# Patient Record
Sex: Female | Born: 1975 | Race: White | Hispanic: No | Marital: Married | Smoking: Never smoker
Health system: Southern US, Community
[De-identification: ages and names within clinical notes are randomized; demographics above are authoritative.]

## PROBLEM LIST (undated history)

## (undated) DIAGNOSIS — D649 Anemia, unspecified: Secondary | ICD-10-CM

## (undated) DIAGNOSIS — I160 Hypertensive urgency: Principal | ICD-10-CM

## (undated) DIAGNOSIS — R0989 Other specified symptoms and signs involving the circulatory and respiratory systems: Secondary | ICD-10-CM

## (undated) DIAGNOSIS — I10 Essential (primary) hypertension: Secondary | ICD-10-CM

## (undated) DIAGNOSIS — E7800 Pure hypercholesterolemia, unspecified: Secondary | ICD-10-CM

## (undated) DIAGNOSIS — Z87442 Personal history of urinary calculi: Secondary | ICD-10-CM

## (undated) DIAGNOSIS — L600 Ingrowing nail: Secondary | ICD-10-CM

## (undated) DIAGNOSIS — R7303 Prediabetes: Secondary | ICD-10-CM

## (undated) DIAGNOSIS — B009 Herpesviral infection, unspecified: Secondary | ICD-10-CM

## (undated) DIAGNOSIS — K219 Gastro-esophageal reflux disease without esophagitis: Secondary | ICD-10-CM

## (undated) DIAGNOSIS — J989 Respiratory disorder, unspecified: Secondary | ICD-10-CM

## (undated) DIAGNOSIS — E039 Hypothyroidism, unspecified: Secondary | ICD-10-CM

## (undated) DIAGNOSIS — D509 Iron deficiency anemia, unspecified: Secondary | ICD-10-CM

## (undated) HISTORY — PX: TOENAIL AVULSION: SHX1074

## (undated) HISTORY — DX: Hypertensive urgency: I16.0

## (undated) HISTORY — DX: Hypothyroidism, unspecified: E03.9

---
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
## 2012-11-06 ENCOUNTER — Other Ambulatory Visit: Payer: Self-pay | Admitting: Occupational Medicine

## 2012-11-06 ENCOUNTER — Ambulatory Visit: Payer: Self-pay

## 2012-11-06 DIAGNOSIS — R7612 Nonspecific reaction to cell mediated immunity measurement of gamma interferon antigen response without active tuberculosis: Secondary | ICD-10-CM

## 2013-11-19 ENCOUNTER — Encounter (HOSPITAL_COMMUNITY): Payer: Self-pay | Admitting: Emergency Medicine

## 2013-11-19 ENCOUNTER — Emergency Department (HOSPITAL_COMMUNITY): Payer: Self-pay

## 2013-11-19 ENCOUNTER — Emergency Department (HOSPITAL_COMMUNITY)
Admission: EM | Admit: 2013-11-19 | Discharge: 2013-11-19 | Disposition: A | Discharge status: Home / Self Care | Payer: Self-pay | Attending: Emergency Medicine | Admitting: Emergency Medicine

## 2013-11-19 DIAGNOSIS — N200 Calculus of kidney: Secondary | ICD-10-CM | POA: Insufficient documentation

## 2013-11-19 DIAGNOSIS — N390 Urinary tract infection, site not specified: Secondary | ICD-10-CM | POA: Insufficient documentation

## 2013-11-19 DIAGNOSIS — Z79899 Other long term (current) drug therapy: Secondary | ICD-10-CM | POA: Insufficient documentation

## 2013-11-19 DIAGNOSIS — R110 Nausea: Secondary | ICD-10-CM | POA: Insufficient documentation

## 2013-11-19 DIAGNOSIS — Z3202 Encounter for pregnancy test, result negative: Secondary | ICD-10-CM | POA: Insufficient documentation

## 2013-11-19 LAB — COMPREHENSIVE METABOLIC PANEL
ALBUMIN: 3.8 g/dL (ref 3.5–5.2)
ALT: 17 U/L (ref 0–35)
AST: 21 U/L (ref 0–37)
Alkaline Phosphatase: 56 U/L (ref 39–117)
BUN: 17 mg/dL (ref 6–23)
CALCIUM: 9 mg/dL (ref 8.4–10.5)
CO2: 24 mEq/L (ref 19–32)
CREATININE: 0.94 mg/dL (ref 0.50–1.10)
Chloride: 98 mEq/L (ref 96–112)
GFR calc Af Amer: 87 mL/min — ABNORMAL LOW (ref >90)
GFR, EST NON AFRICAN AMERICAN: 75 mL/min — AB (ref >90)
Glucose, Bld: 213 mg/dL — ABNORMAL HIGH (ref 70–99)
Potassium: 3.1 mEq/L — ABNORMAL LOW (ref 3.7–5.3)
Sodium: 140 mEq/L (ref 137–147)
Total Bilirubin: 0.3 mg/dL (ref 0.3–1.2)
Total Protein: 6.8 g/dL (ref 6.0–8.3)

## 2013-11-19 LAB — CBC WITH DIFFERENTIAL/PLATELET
BASOS ABS: 0 10*3/uL (ref 0.0–0.1)
BASOS PCT: 0 % (ref 0–1)
EOS ABS: 0 10*3/uL (ref 0.0–0.7)
EOS PCT: 0 % (ref 0–5)
HEMATOCRIT: 42.3 % (ref 36.0–46.0)
Hemoglobin: 14.5 g/dL (ref 12.0–15.0)
Lymphocytes Relative: 14 % (ref 12–46)
Lymphs Abs: 1.7 10*3/uL (ref 0.7–4.0)
MCH: 31.7 pg (ref 26.0–34.0)
MCHC: 34.3 g/dL (ref 30.0–36.0)
MCV: 92.4 fL (ref 78.0–100.0)
MONO ABS: 0.5 10*3/uL (ref 0.1–1.0)
Monocytes Relative: 4 % (ref 3–12)
Neutro Abs: 10.3 10*3/uL — ABNORMAL HIGH (ref 1.7–7.7)
Neutrophils Relative %: 82 % — ABNORMAL HIGH (ref 43–77)
Platelets: 234 10*3/uL (ref 150–400)
RBC: 4.58 MIL/uL (ref 3.87–5.11)
RDW: 12.8 % (ref 11.5–15.5)
WBC: 12.5 10*3/uL — ABNORMAL HIGH (ref 4.0–10.5)

## 2013-11-19 LAB — URINE MICROSCOPIC-ADD ON

## 2013-11-19 LAB — URINALYSIS, ROUTINE W REFLEX MICROSCOPIC
Glucose, UA: NEGATIVE mg/dL
Ketones, ur: 15 mg/dL — AB
Nitrite: NEGATIVE
Protein, ur: 100 mg/dL — AB
SPECIFIC GRAVITY, URINE: 1.026 (ref 1.005–1.030)
UROBILINOGEN UA: 1 mg/dL (ref 0.0–1.0)
pH: 5 (ref 5.0–8.0)

## 2013-11-19 LAB — PREGNANCY, URINE: PREG TEST UR: NEGATIVE

## 2013-11-19 MED ORDER — CIPROFLOXACIN HCL 500 MG PO TABS
500.0000 mg | ORAL_TABLET | Freq: Once | ORAL | Status: AC
  Administered 2013-11-19: 500 mg via ORAL
  Filled 2013-11-19: qty 1

## 2013-11-19 MED ORDER — ONDANSETRON 8 MG PO TBDP: 4.0000 mg | ORAL_TABLET | Freq: Three times a day (TID) | ORAL | Status: DC | PRN

## 2013-11-19 MED ORDER — OXYCODONE-ACETAMINOPHEN 5-325 MG PO TABS: 1.0000 | ORAL_TABLET | Freq: Four times a day (QID) | ORAL | Status: DC | PRN

## 2013-11-19 MED ORDER — OXYCODONE-ACETAMINOPHEN 5-325 MG PO TABS
1.0000 | ORAL_TABLET | Freq: Once | ORAL | Status: AC
  Administered 2013-11-19: 1 via ORAL
  Filled 2013-11-19: qty 1

## 2013-11-19 MED ORDER — ONDANSETRON 4 MG PO TBDP
8.0000 mg | ORAL_TABLET | Freq: Once | ORAL | Status: AC
  Administered 2013-11-19: 8 mg via ORAL
  Filled 2013-11-19: qty 2

## 2013-11-19 MED ORDER — CIPROFLOXACIN HCL 500 MG PO TABS: 500.0000 mg | ORAL_TABLET | Freq: Two times a day (BID) | ORAL | Status: DC

## 2013-11-19 NOTE — ED Notes (Signed)
NP Baker Janus at the bedside.

## 2013-11-19 NOTE — Discharge Instructions (Signed)
You have multiple large stones in both kidneys.  There are none in the tubes that drain into kidneys, you do have a urinary tract, infection, You have been given a prescription for.  Please take this as directed.  We've also been given a prescription for something for pain to use as needed.  Is recommended that you followup with urology You have  been given a referral to Dr. Matilde Sprang , please call and make an appointment

## 2013-11-19 NOTE — ED Provider Notes (Signed)
CSN: [id number]     Arrival date & time 11/19/13  1713 History   First MD Initiated Contact with Patient 11/19/13 2113     Chief Complaint  Patient presents with  . Flank Pain     (Consider location/radiation/quality/duration/timing/severity/associated sxs/prior Treatment) HPI Comments: Patient with a history of kidney stones, states, that for the past, month.  She's had left flank discomfort, but thought it was just muscle pain from her job.  She's noticed hematuria for the past 3, days.  Tonight at work.  She had a sudden exacerbation of left flank pain, radiating to her abdomen, with nausea, no vomiting, or diarrhea  Patient is a 38 y.o. female presenting with flank pain. The history is provided by the patient.  Flank Pain This is a recurrent problem. The current episode started in the past 7 days. The problem has been gradually worsening. Associated symptoms include abdominal pain and nausea. Pertinent negatives include no fatigue, fever or vomiting. Associated symptoms comments: Left flank pain, and hematuria. Nothing aggravates the symptoms. The treatment provided no relief.    History reviewed. No pertinent past medical history. History reviewed. No pertinent past surgical history. No family history on file. History  Substance Use Topics  . Smoking status: Never Smoker   . Smokeless tobacco: Not on file  . Alcohol Use: No   OB History   Grav Para Term Preterm Abortions TAB SAB Ect Mult Living                 Review of Systems  Constitutional: Negative for fever and fatigue.  Gastrointestinal: Positive for nausea and abdominal pain. Negative for vomiting.  Genitourinary: Positive for hematuria and flank pain. Negative for dysuria, decreased urine volume and vaginal bleeding.  All other systems reviewed and are negative.     Allergies  Biaxin  Home Medications   Prior to Admission medications   Medication Sig Start Date End Date Taking? Authorizing Provider   ferrous sulfate 325 (65 FE) MG tablet Take 325 mg by mouth daily with breakfast.   Yes Historical Provider, MD  ibuprofen (ADVIL,MOTRIN) 200 MG tablet Take 400 mg by mouth every 6 (six) hours as needed for fever.   Yes Historical Provider, MD  Multiple Vitamins-Minerals (MULTIVITAMIN WITH MINERALS) tablet Take 1 tablet by mouth daily.   Yes Historical Provider, MD  PRESCRIPTION MEDICATION Take 1 tablet by mouth daily. Doesn't know the name of this medication   Yes Historical Provider, MD  valACYclovir (VALTREX) 500 MG tablet Take 500 mg by mouth daily.   Yes Historical Provider, MD   BP 161/76  Pulse 87  Temp(Src) 97.8 F (36.6 C) (Oral)  Resp 16  Ht 4' 9"  (1.448 m)  Wt 97 lb (43.999 kg)  BMI 20.98 kg/m2  SpO2 100%  LMP 11/18/2013 Physical Exam  Nursing note and vitals reviewed. Constitutional: She appears well-developed and well-nourished.  HENT:  Head: Normocephalic.  Eyes: Pupils are equal, round, and reactive to light.  Neck: Normal range of motion.  Cardiovascular: Normal rate and regular rhythm.   Pulmonary/Chest: Effort normal and breath sounds normal.  Abdominal: Soft. She exhibits no distension. There is no tenderness.  Neurological: She is alert.  Skin: No pallor.    ED Course  Procedures (including critical care time) Labs Review Labs Reviewed  CBC WITH DIFFERENTIAL - Abnormal; Notable for the following:    WBC 12.5 (*)    Neutrophils Relative % 82 (*)    Neutro Abs 10.3 (*)  All other components within normal limits  COMPREHENSIVE METABOLIC PANEL - Abnormal; Notable for the following:    Potassium 3.1 (*)    Glucose, Bld 213 (*)    GFR calc non Af Amer 75 (*)    GFR calc Af Amer 87 (*)    All other components within normal limits  URINALYSIS, ROUTINE W REFLEX MICROSCOPIC - Abnormal; Notable for the following:    Color, Urine AMBER (*)    APPearance CLOUDY (*)    Hgb urine dipstick LARGE (*)    Bilirubin Urine SMALL (*)    Ketones, ur 15 (*)     Protein, ur 100 (*)    Leukocytes, UA MODERATE (*)    All other components within normal limits  URINE MICROSCOPIC-ADD ON - Abnormal; Notable for the following:    Squamous Epithelial / LPF FEW (*)    Bacteria, UA MANY (*)    Crystals CA OXALATE CRYSTALS (*)    All other components within normal limits  PREGNANCY, URINE    Imaging Review No results found.   EKG Interpretation None      MDM  Since being discharged home with a diagnosis of UTI and renal stones.  She was informed that she has multiple stones in both renal holes recommend that you followup with urology.  She understands this.  She is being discharged home with a prescription for Cipro, Zofran, and Percocet.  She's been given return, precautions.  She's also been cautioned to use alternative form of birth control.  While she is taking the antibiotic until her meds.  Next menstrual cycle Final diagnoses:  None         Garald Balding, NP 11/19/13 2312

## 2013-11-19 NOTE — ED Notes (Signed)
Apologized to pt for wait time. Pt reports initial relief with pain medication but states pain is slowly returning. Now rates 6/10.

## 2013-11-19 NOTE — ED Notes (Signed)
Pt c/o lt flank pain since 1600 she was at work here.  Nauseated no vomiting or diarrhea.  Bloody urine for 2-3 days.  lmp  5-12

## 2013-11-20 NOTE — ED Provider Notes (Signed)
Medical screening examination/treatment/procedure(s) were performed by non-physician practitioner and as supervising physician I was immediately available for consultation/collaboration.    Dot Lanes, MD 11/20/13 [phone]

## 2013-11-21 LAB — URINE CULTURE
Colony Count: 6000
Special Requests: NORMAL

## 2013-12-08 ENCOUNTER — Other Ambulatory Visit: Payer: Self-pay | Admitting: Urology

## 2013-12-09 ENCOUNTER — Encounter (HOSPITAL_COMMUNITY): Payer: Self-pay | Admitting: Pharmacy Technician

## 2013-12-10 ENCOUNTER — Encounter (HOSPITAL_COMMUNITY): Payer: Self-pay

## 2013-12-10 ENCOUNTER — Other Ambulatory Visit: Payer: Self-pay

## 2013-12-10 ENCOUNTER — Encounter (HOSPITAL_COMMUNITY)
Admission: RE | Admit: 2013-12-10 | Discharge: 2013-12-10 | Disposition: A | Discharge status: Home / Self Care | Payer: Self-pay | Source: Ambulatory Visit | Attending: Urology | Admitting: Urology

## 2013-12-10 ENCOUNTER — Inpatient Hospital Stay (HOSPITAL_COMMUNITY): Admission: RE | Admit: 2013-12-10 | Payer: Self-pay | Source: Ambulatory Visit

## 2013-12-10 DIAGNOSIS — Z01812 Encounter for preprocedural laboratory examination: Secondary | ICD-10-CM | POA: Insufficient documentation

## 2013-12-10 DIAGNOSIS — Z01810 Encounter for preprocedural cardiovascular examination: Secondary | ICD-10-CM | POA: Insufficient documentation

## 2013-12-10 HISTORY — DX: Anemia, unspecified: D64.9

## 2013-12-10 HISTORY — DX: Personal history of urinary calculi: Z87.442

## 2013-12-10 HISTORY — DX: Ingrowing nail: L60.0

## 2013-12-10 HISTORY — DX: Gastro-esophageal reflux disease without esophagitis: K21.9

## 2013-12-10 HISTORY — DX: Pure hypercholesterolemia, unspecified: E78.00

## 2013-12-10 LAB — CBC
HEMATOCRIT: 43.3 % (ref 36.0–46.0)
Hemoglobin: 14.6 g/dL (ref 12.0–15.0)
MCH: 30.9 pg (ref 26.0–34.0)
MCHC: 33.7 g/dL (ref 30.0–36.0)
MCV: 91.7 fL (ref 78.0–100.0)
PLATELETS: 268 10*3/uL (ref 150–400)
RBC: 4.72 MIL/uL (ref 3.87–5.11)
RDW: 12.9 % (ref 11.5–15.5)
WBC: 7.6 10*3/uL (ref 4.0–10.5)

## 2013-12-10 LAB — BASIC METABOLIC PANEL
BUN: 14 mg/dL (ref 6–23)
CALCIUM: 9.1 mg/dL (ref 8.4–10.5)
CHLORIDE: 101 meq/L (ref 96–112)
CO2: 27 mEq/L (ref 19–32)
Creatinine, Ser: 0.78 mg/dL (ref 0.50–1.10)
GFR calc non Af Amer: >90 mL/min (ref >90)
Glucose, Bld: 89 mg/dL (ref 70–99)
Potassium: 4 mEq/L (ref 3.7–5.3)
Sodium: 139 mEq/L (ref 137–147)

## 2013-12-10 LAB — HCG, SERUM, QUALITATIVE: PREG SERUM: NEGATIVE

## 2013-12-10 NOTE — Patient Instructions (Signed)
Your procedure is scheduled on:  12/11/13  THURSDAY  Report to [facility] at   1030    AM.   Call this number if you have problems the morning of surgery: [phone]        Do not eat food  Or drink :After Midnight. tonight   Take these medicines the morning of surgery with A SIP OF WATER:No regular medications--- May take Ondasteron/Oxycodone if needed   .  Contacts, dentures or partial plates, or metal hairpins  can not be worn to surgery. Your family will be responsible for glasses, dentures, hearing aides while you are in surgery  Leave suitcase in the car. After surgery it may be brought to your room.  For patients admitted to the hospital, checkout time is 11:00 AM day of  discharge.         [facility] IS NOT RESPONSIBLE FOR ANY VALUABLES  Patients discharged the day of surgery will not be allowed to drive home. IF going home the day of surgery, you must have a driver and someone to stay with you for the first 24 hours  Name and phone number of your driver:  Louanna Raw                                                                                                                                       [facility] - Preparing for Surgery Before surgery, you can play an important role.  Because skin is not sterile, your skin needs to be as free of germs as possible.  You can reduce the number of germs on your skin by washing with CHG (chlorahexidine gluconate) soap before surgery.  CHG is an antiseptic cleaner which kills germs and bonds with the skin to continue killing germs even after washing. Please DO NOT use if you have an allergy to CHG or antibacterial soaps.  If your skin becomes reddened/irritated stop using the CHG and inform your nurse when you arrive at Short Stay. Do not shave (including legs and underarms) for at least 48 hours prior to the first CHG shower.  You may shave your  face/neck. Please follow these instructions carefully:  1.  Shower with CHG Soap the night before surgery and the  morning of Surgery.  2.  If you choose to wash your hair, wash your hair first as usual with your  normal  shampoo.  3.  After you shampoo, rinse your hair and body thoroughly to remove the  shampoo.                           4.  Use CHG as you would any other liquid soap.  You can apply chg directly  to the skin and wash  Gently with a scrungie or clean washcloth.  5.  Apply the CHG Soap to your body ONLY FROM THE NECK DOWN.   Do not use on face/ open                           Wound or open sores. Avoid contact with eyes, ears mouth and genitals (private parts).                       Wash face,  Genitals (private parts) with your normal soap.             6.  Wash thoroughly, paying special attention to the area where your surgery  will be performed.  7.  Thoroughly rinse your body with warm water from the neck down.  8.  DO NOT shower/wash with your normal soap after using and rinsing off  the CHG Soap.                9.  Pat yourself dry with a clean towel.            10.  Wear clean pajamas.            11.  Place clean sheets on your bed the night of your first shower and do not  sleep with pets. Day of Surgery : Do not apply any lotions/deodorants the morning of surgery.  Please wear clean clothes to the hospital/surgery center.  FAILURE TO FOLLOW THESE INSTRUCTIONS MAY RESULT IN THE CANCELLATION OF YOUR SURGERY PATIENT SIGNATURE_________________________________  NURSE SIGNATURE__________________________________  ________________________________________________________________________

## 2013-12-11 ENCOUNTER — Ambulatory Visit (HOSPITAL_COMMUNITY): Admission: RE | Admit: 2013-12-11 | Payer: Self-pay | Source: Ambulatory Visit | Admitting: Urology

## 2013-12-11 ENCOUNTER — Encounter (HOSPITAL_COMMUNITY): Admission: RE | Payer: Self-pay | Source: Ambulatory Visit

## 2013-12-11 SURGERY — LITHOTRIPSY, ESWL
Anesthesia: LOCAL

## 2013-12-11 SURGERY — CYSTOURETEROSCOPY, WITH RETROGRADE PYELOGRAM AND STENT INSERTION
Anesthesia: General | Laterality: Left

## 2013-12-20 DIAGNOSIS — N200 Calculus of kidney: Secondary | ICD-10-CM | POA: Insufficient documentation

## 2014-05-23 ENCOUNTER — Emergency Department
Admission: EM | Admit: 2014-05-23 | Discharge: 2014-05-23 | Discharge status: Absent without leave | Payer: Self-pay | Source: Home / Self Care

## 2014-11-07 ENCOUNTER — Emergency Department (HOSPITAL_COMMUNITY)
Admission: EM | Admit: 2014-11-07 | Discharge: 2014-11-07 | Disposition: A | Discharge status: Home / Self Care | Payer: 59 | Source: Home / Self Care | Attending: Family Medicine | Admitting: Family Medicine

## 2014-11-07 ENCOUNTER — Encounter (HOSPITAL_COMMUNITY): Payer: Self-pay | Admitting: Emergency Medicine

## 2014-11-07 DIAGNOSIS — H109 Unspecified conjunctivitis: Secondary | ICD-10-CM

## 2014-11-07 DIAGNOSIS — I10 Essential (primary) hypertension: Secondary | ICD-10-CM

## 2014-11-07 MED ORDER — TOBRAMYCIN 0.3 % OP SOLN
OPHTHALMIC | Status: AC
  Filled 2014-11-07: qty 5

## 2014-11-07 MED ORDER — TOBRAMYCIN 0.3 % OP SOLN: 1.0000 [drp] | OPHTHALMIC | Status: DC

## 2014-11-07 NOTE — Discharge Instructions (Signed)
Your blood pressure is 220/100 today, you need to follow up with your primary care provider to be evaluated for your hypertension and consider starting blood pressure medication.   Conjunctivitis Conjunctivitis is commonly called "pink eye." Conjunctivitis can be caused by bacterial or viral infection, allergies, or injuries. There is usually redness of the lining of the eye, itching, discomfort, and sometimes discharge. There may be deposits of matter along the eyelids. A viral infection usually causes a watery discharge, while a bacterial infection causes a yellowish, thick discharge. Pink eye is very contagious and spreads by direct contact. You may be given antibiotic eyedrops as part of your treatment. Before using your eye medicine, remove all drainage from the eye by washing gently with warm water and cotton balls. Continue to use the medication until you have awakened 2 mornings in a row without discharge from the eye. Do not rub your eye. This increases the irritation and helps spread infection. Use separate towels from other household members. Wash your hands with soap and water before and after touching your eyes. Use cold compresses to reduce pain and sunglasses to relieve irritation from light. Do not wear contact lenses or wear eye makeup until the infection is gone. SEEK MEDICAL CARE IF:   Your symptoms are not better after 3 days of treatment.  You have increased pain or trouble seeing.  The outer eyelids become very red or swollen. Document Released: 08/03/2004 Document Revised: 09/18/2011 Document Reviewed: 06/26/2005 [facility] Patient Information 2015 [place], [place]. This information is not intended to replace advice given to you by your health care provider. Make sure you discuss any questions you have with your health care provider.

## 2014-11-07 NOTE — ED Provider Notes (Signed)
CSN: [id number]     Arrival date & time 11/07/14  1646 History   First MD Initiated Contact with Patient 11/07/14 1748     Chief Complaint  Patient presents with  . Eye Pain   (Consider location/radiation/quality/duration/timing/severity/associated sxs/prior Treatment) HPI      39 year old female presents for evaluation of redness and irritation of her left eye. This initially started on Friday. She had redness of her eye and her eyelid, swelling, and the eye was crusted shut when she woke up yesterday morning. She had Polytrim eye drops left over from a previous episode of conjunctivitis, she started using these and it has started getting better. However she ran out of the drops so she would like some more. She gets mildly blurred vision and she feels like there is a film of drainage over her eye but this resolves with blinking. She denies any pain in the eye. She denies any other symptoms such as chest pain, shortness of breath, NVD. She denies any trauma to the eye.  Past Medical History  Diagnosis Date  . History of kidney stones   . GERD (gastroesophageal reflux disease)   . Anemia   . Ingrown right big toenail 1998  . White coat syndrome without diagnosis of hypertension   . Hypercholesterolemia    History reviewed. No pertinent past surgical history. No family history on file. History  Substance Use Topics  . Smoking status: Never Smoker   . Smokeless tobacco: Never Used  . Alcohol Use: No   OB History    No data available     Review of Systems  Eyes: Positive for discharge, redness and itching. Negative for photophobia, pain and visual disturbance.  All other systems reviewed and are negative.   Allergies  Biaxin  Home Medications   Prior to Admission medications   Medication Sig Start Date End Date Taking? Authorizing Provider  acetaminophen (TYLENOL) 500 MG tablet Take 500-1,000 mg by mouth every 6 (six) hours as needed for moderate pain.    Historical Provider,  MD  cetirizine (ZYRTEC) 10 MG tablet Take 10 mg by mouth at bedtime.     Historical Provider, MD  ferrous sulfate 325 (65 FE) MG tablet Take 325 mg by mouth daily with breakfast.    Historical Provider, MD  fluticasone (FLONASE) 50 MCG/ACT nasal spray Place 1 spray into both nostrils daily as needed for allergies or rhinitis.    Historical Provider, MD  ibuprofen (ADVIL,MOTRIN) 200 MG tablet Take 600 mg by mouth every 6 (six) hours as needed for moderate pain.     Historical Provider, MD  Multiple Vitamins-Minerals (MULTIVITAMIN WITH MINERALS) tablet Take 1 tablet by mouth daily.    Historical Provider, MD  ondansetron (ZOFRAN-ODT) 8 MG disintegrating tablet Take 0.5 tablets (4 mg total) by mouth every 8 (eight) hours as needed for nausea or vomiting. 11/19/13   Junius Creamer, NP  oxyCODONE-acetaminophen (PERCOCET/ROXICET) 5-325 MG per tablet Take 0.5-1 tablets by mouth every 6 (six) hours as needed for severe pain.    Historical Provider, MD  tetrahydrozoline 0.05 % ophthalmic solution Place 1 drop into both eyes 2 (two) times daily as needed (red eyes).    Historical Provider, MD  valACYclovir (VALTREX) 500 MG tablet Take 500 mg by mouth daily.    Historical Provider, MD   BP 221/102 mmHg  Pulse 64  Temp(Src) 98.2 F (36.8 C) (Oral)  Resp 15  SpO2 98%  LMP 10/17/2014 Physical Exam  Constitutional: She is oriented to person,  place, and time. Vital signs are normal. She appears well-developed and well-nourished. No distress.  HENT:  Head: Normocephalic and atraumatic.  Eyes: EOM are normal. Pupils are equal, round, and reactive to light. Lids are everted and swept, no foreign bodies found. Left conjunctiva is injected.  Pulmonary/Chest: Effort normal. No respiratory distress.  Neurological: She is alert and oriented to person, place, and time. She has normal strength. Coordination normal.  Skin: Skin is warm and dry. No rash noted. She is not diaphoretic.  Psychiatric: She has a normal mood  and affect. Judgment normal.  Nursing note and vitals reviewed.   ED Course  Procedures (including critical care time) Labs Review Labs Reviewed - No data to display  Imaging Review No results found.   MDM   1. Conjunctivitis, left eye   2. Essential hypertension    Her eye has been improving with antibiotic drops, we will continue antibiotic drops. Her blood pressure is extremely elevated today. She declines any evaluation or intervention for this problem, she will go to see her primary care doctor in the coming week. If she develops chest pain, shortness of breath, numbness, weakness, or decreased urination/dark urine, she will go to the emergency department.  Meds ordered this encounter  Medications  . tobramycin (TOBREX) 0.3 % ophthalmic solution 1 drop    Sig:        Liam Graham, PA-C 11/07/14 1823

## 2014-11-07 NOTE — ED Notes (Addendum)
Eye problem.  Onset Friday morning of symptoms.  No known injury.  Patient does not where contacts.

## 2015-03-22 ENCOUNTER — Emergency Department
Admission: EM | Admit: 2015-03-22 | Discharge: 2015-03-22 | Disposition: A | Discharge status: Home / Self Care | Payer: 59 | Source: Home / Self Care | Attending: Family Medicine | Admitting: Family Medicine

## 2015-03-22 ENCOUNTER — Encounter: Payer: Self-pay | Admitting: *Deleted

## 2015-03-22 DIAGNOSIS — R030 Elevated blood-pressure reading, without diagnosis of hypertension: Secondary | ICD-10-CM

## 2015-03-22 DIAGNOSIS — R109 Unspecified abdominal pain: Secondary | ICD-10-CM

## 2015-03-22 DIAGNOSIS — N3090 Cystitis, unspecified without hematuria: Secondary | ICD-10-CM | POA: Diagnosis not present

## 2015-03-22 HISTORY — DX: Herpesviral infection, unspecified: B00.9

## 2015-03-22 LAB — POCT URINALYSIS DIP (MANUAL ENTRY)
Bilirubin, UA: NEGATIVE
GLUCOSE UA: NEGATIVE
Ketones, POC UA: NEGATIVE
NITRITE UA: NEGATIVE
SPEC GRAV UA: 1.01 (ref 1.005–1.03)
Urobilinogen, UA: 0.2 (ref 0–1)
pH, UA: 6 (ref 5–8)

## 2015-03-22 MED ORDER — PHENAZOPYRIDINE HCL 200 MG PO TABS: 200.0000 mg | ORAL_TABLET | Freq: Three times a day (TID) | ORAL | Status: DC

## 2015-03-22 MED ORDER — NITROFURANTOIN MONOHYD MACRO 100 MG PO CAPS: 100.0000 mg | ORAL_CAPSULE | Freq: Two times a day (BID) | ORAL | Status: DC

## 2015-03-22 NOTE — ED Notes (Signed)
Pt c/o 3 days of flank pain, polyuria and dysuria. Afebrile/

## 2015-03-22 NOTE — ED Provider Notes (Signed)
CSN: [id number]     Arrival date & time 03/22/15  4665 History   First MD Initiated Contact with Patient 03/22/15 1031     Chief Complaint  Patient presents with  . Flank Pain  . Polyuria      HPI Comments: Patient complains of 3 day history of urinary frequency, mild low back ache, mild dysuria, hematuria, and lower abdominal pressure.  She has a past history of nephrolithiasis. She denies history of hypertension.  Patient is a 39 y.o. female presenting with dysuria. The history is provided by the patient.  Dysuria Pain quality:  Burning Pain severity:  Mild Onset quality:  Gradual Duration:  3 days Timing:  Constant Progression:  Worsening Chronicity:  New Recent urinary tract infections: no   Relieved by:  None tried Worsened by:  Nothing tried Ineffective treatments:  None tried Urinary symptoms: frequent urination, hematuria and hesitancy   Urinary symptoms: no discolored urine, no foul-smelling urine and no bladder incontinence   Associated symptoms: flank pain   Associated symptoms: no abdominal pain, no fever, no genital lesions, no nausea, no vaginal discharge and no vomiting   Risk factors: hx of urolithiasis     Past Medical History  Diagnosis Date  . History of kidney stones   . GERD (gastroesophageal reflux disease)   . Anemia   . Ingrown right big toenail 1998  . White coat syndrome without diagnosis of hypertension   . Hypercholesterolemia   . HSV (herpes simplex virus) infection    Past Surgical History  Procedure Laterality Date  . Toenail avulsion     Family History  Problem Relation Age of Onset  . Anxiety disorder Mother   . Hypertension Mother   . Dementia Mother    Social History  Substance Use Topics  . Smoking status: Never Smoker   . Smokeless tobacco: Never Used  . Alcohol Use: No   OB History    No data available     Review of Systems  Constitutional: Negative for fever.  Gastrointestinal: Negative for nausea, vomiting and  abdominal pain.  Genitourinary: Positive for dysuria and flank pain. Negative for vaginal discharge.  All other systems reviewed and are negative.   Allergies  Biaxin  Home Medications   Prior to Admission medications   Medication Sig Start Date End Date Taking? Authorizing Provider  cetirizine (ZYRTEC) 10 MG tablet Take 10 mg by mouth at bedtime.    Yes Historical Provider, MD  fluticasone (FLONASE) 50 MCG/ACT nasal spray Place 1 spray into both nostrils daily as needed for allergies or rhinitis.   Yes Historical Provider, MD  levonorgestrel-ethinyl estradiol (AVIANE,ALESSE,LESSINA) 0.1-20 MG-MCG tablet Take 1 tablet by mouth daily.   Yes Historical Provider, MD  Multiple Vitamins-Minerals (MULTIVITAMIN WITH MINERALS) tablet Take 1 tablet by mouth daily.   Yes Historical Provider, MD  valACYclovir (VALTREX) 500 MG tablet Take 500 mg by mouth daily.   Yes Historical Provider, MD  ferrous sulfate 325 (65 FE) MG tablet Take 325 mg by mouth daily with breakfast.    Historical Provider, MD  nitrofurantoin, macrocrystal-monohydrate, (MACROBID) 100 MG capsule Take 1 capsule (100 mg total) by mouth 2 (two) times daily. Take with food. 03/22/15   Kandra Nicolas, MD  oxyCODONE-acetaminophen (PERCOCET/ROXICET) 5-325 MG per tablet Take 0.5-1 tablets by mouth every 6 (six) hours as needed for severe pain.    Historical Provider, MD  phenazopyridine (PYRIDIUM) 200 MG tablet Take 1 tablet (200 mg total) by mouth 3 (three) times daily.  Take after meals. 03/22/15   Kandra Nicolas, MD  tetrahydrozoline 0.05 % ophthalmic solution Place 1 drop into both eyes 2 (two) times daily as needed (red eyes).    Historical Provider, MD   Meds Ordered and Administered this Visit  Medications - No data to display  BP 200/104 mmHg  Pulse 81  Temp(Src) 98.6 F (37 C) (Oral)  Resp 16  Ht 4' 9"  (1.448 m)  Wt 108 lb (48.988 kg)  BMI 23.36 kg/m2  SpO2 100%  LMP 03/08/2015 No data found.   Physical Exam Nursing  notes and Vital Signs reviewed. Appearance:  Patient appears stated age, and in no acute distress Eyes:  Pupils are equal, round, and reactive to light and accomodation.  Extraocular movement is intact.  Conjunctivae are not inflamed  Nose:  Normal Pharynx:  Normal Neck:  Supple.   No adenopathy or thyromegaly Lungs:  Clear to auscultation.  Breath sounds are equal.  Moving air well. Heart:  Regular rate and rhythm without murmurs, rubs, or gallops.  Abdomen:  Nontender without masses or hepatosplenomegaly.  Bowel sounds are present.  No CVA or flank tenderness.  Extremities:  No edema.  No calf tenderness Skin:  No rash present.   ED Course  Procedures  None    Labs Reviewed  POCT URINALYSIS DIP (MANUAL ENTRY) - Abnormal; Notable for the following:    Blood, UA moderate (*)    Protein Ur, POC =30 (*)    Leukocytes, UA small (1+) (*)    All other components within normal limits  URINE CULTURE       MDM   1. Flank pain   2. Cystitis   3. Blood pressure elevated without history of HTN      Urine culture pending. Begin Macrobid 120m BID for one week. Increase fluid intake. If symptoms become significantly worse during the night or over the weekend, proceed to the local emergency room.  Followup with Family Doctor if not improved in one week.      Recommend obtaining blood pressure monitor and check at home regularly.  followup with family doctor if BP remains elevated.    SKandra Nicolas MD 03/30/15 [phone]

## 2015-03-22 NOTE — Discharge Instructions (Signed)
Continue increased fluid intake  Recommend obtaining blood pressure monitor and check at home regularly.  followup with family doctor if BP remains elevated.   Urinary Tract Infection Urinary tract infections (UTIs) can develop anywhere along your urinary tract. Your urinary tract is your body's drainage system for removing wastes and extra water. Your urinary tract includes two kidneys, two ureters, a bladder, and a urethra. Your kidneys are a pair of bean-shaped organs. Each kidney is about the size of your fist. They are located below your ribs, one on each side of your spine. CAUSES Infections are caused by microbes, which are microscopic organisms, including fungi, viruses, and bacteria. These organisms are so small that they can only be seen through a microscope. Bacteria are the microbes that most commonly cause UTIs. SYMPTOMS  Symptoms of UTIs may vary by age and gender of the patient and by the location of the infection. Symptoms in young women typically include a frequent and intense urge to urinate and a painful, burning feeling in the bladder or urethra during urination. Older women and men are more likely to be tired, shaky, and weak and have muscle aches and abdominal pain. A fever may mean the infection is in your kidneys. Other symptoms of a kidney infection include pain in your back or sides below the ribs, nausea, and vomiting. DIAGNOSIS To diagnose a UTI, your caregiver will ask you about your symptoms. Your caregiver also will ask to provide a urine sample. The urine sample will be tested for bacteria and white blood cells. White blood cells are made by your body to help fight infection. TREATMENT  Typically, UTIs can be treated with medication. Because most UTIs are caused by a bacterial infection, they usually can be treated with the use of antibiotics. The choice of antibiotic and length of treatment depend on your symptoms and the type of bacteria causing your infection. HOME  CARE INSTRUCTIONS  If you were prescribed antibiotics, take them exactly as your caregiver instructs you. Finish the medication even if you feel better after you have only taken some of the medication.  Drink enough water and fluids to keep your urine clear or pale yellow.  Avoid caffeine, tea, and carbonated beverages. They tend to irritate your bladder.  Empty your bladder often. Avoid holding urine for long periods of time.  Empty your bladder before and after sexual intercourse.  After a bowel movement, women should cleanse from front to back. Use each tissue only once. SEEK MEDICAL CARE IF:   You have back pain.  You develop a fever.  Your symptoms do not begin to resolve within 3 days. SEEK IMMEDIATE MEDICAL CARE IF:   You have severe back pain or lower abdominal pain.  You develop chills.  You have nausea or vomiting.  You have continued burning or discomfort with urination. MAKE SURE YOU:   Understand these instructions.  Will watch your condition.  Will get help right away if you are not doing well or get worse. Document Released: 04/05/2005 Document Revised: 12/26/2011 Document Reviewed: 08/04/2011 [facility] Patient Information 2015 [place], [place]. This information is not intended to replace advice given to you by your health care provider. Make sure you discuss any questions you have with your health care provider.    Hypertension Hypertension, commonly called high blood pressure, is when the force of blood pumping through your arteries is too strong. Your arteries are the blood vessels that carry blood from your heart throughout your body. A blood pressure  reading consists of a higher number over a lower number, such as 110/72. The higher number (systolic) is the pressure inside your arteries when your heart pumps. The lower number (diastolic) is the pressure inside your arteries when your heart relaxes. Ideally you want your blood pressure below  120/80. Hypertension forces your heart to work harder to pump blood. Your arteries may become narrow or stiff. Having hypertension puts you at risk for heart disease, stroke, and other problems.  RISK FACTORS Some risk factors for high blood pressure are controllable. Others are not.  Risk factors you cannot control include:   Race. You may be at higher risk if you are African American.  Age. Risk increases with age.  Gender. Men are at higher risk than women before age 33 years. After age 4, women are at higher risk than men. Risk factors you can control include:  Not getting enough exercise or physical activity.  Being overweight.  Getting too much fat, sugar, calories, or salt in your diet.  Drinking too much alcohol. SIGNS AND SYMPTOMS Hypertension does not usually cause signs or symptoms. Extremely high blood pressure (hypertensive crisis) may cause headache, anxiety, shortness of breath, and nosebleed. DIAGNOSIS  To check if you have hypertension, your health care provider will measure your blood pressure while you are seated, with your arm held at the level of your heart. It should be measured at least twice using the same arm. Certain conditions can cause a difference in blood pressure between your right and left arms. A blood pressure reading that is higher than normal on one occasion does not mean that you need treatment. If one blood pressure reading is high, ask your health care provider about having it checked again. TREATMENT  Treating high blood pressure includes making lifestyle changes and possibly taking medicine. Living a healthy lifestyle can help lower high blood pressure. You may need to change some of your habits. Lifestyle changes may include:  Following the DASH diet. This diet is high in fruits, vegetables, and whole grains. It is low in salt, red meat, and added sugars.  Getting at least 2 hours of brisk physical activity every week.  Losing weight if  necessary.  Not smoking.  Limiting alcoholic beverages.  Learning ways to reduce stress. If lifestyle changes are not enough to get your blood pressure under control, your health care provider may prescribe medicine. You may need to take more than one. Work closely with your health care provider to understand the risks and benefits. HOME CARE INSTRUCTIONS  Have your blood pressure rechecked as directed by your health care provider.   Take medicines only as directed by your health care provider. Follow the directions carefully. Blood pressure medicines must be taken as prescribed. The medicine does not work as well when you skip doses. Skipping doses also puts you at risk for problems.   Do not smoke.   Monitor your blood pressure at home as directed by your health care provider. SEEK MEDICAL CARE IF:   You think you are having a reaction to medicines taken.  You have recurrent headaches or feel dizzy.  You have swelling in your ankles.  You have trouble with your vision. SEEK IMMEDIATE MEDICAL CARE IF:  You develop a severe headache or confusion.  You have unusual weakness, numbness, or feel faint.  You have severe chest or abdominal pain.  You vomit repeatedly.  You have trouble breathing. MAKE SURE YOU:   Understand these instructions.  Will watch  your condition.  Will get help right away if you are not doing well or get worse. Document Released: 06/26/2005 Document Revised: 11/10/2013 Document Reviewed: 04/18/2013 [facility] Patient Information 2015 [place], [place]. This information is not intended to replace advice given to you by your health care provider. Make sure you discuss any questions you have with your health care provider.

## 2015-03-23 LAB — URINE CULTURE
Colony Count: NO GROWTH
Organism ID, Bacteria: NO GROWTH

## 2015-03-24 ENCOUNTER — Telehealth: Payer: Self-pay | Admitting: Emergency Medicine

## 2015-03-26 ENCOUNTER — Encounter: Payer: Self-pay | Admitting: Osteopathic Medicine

## 2015-03-26 ENCOUNTER — Ambulatory Visit (INDEPENDENT_AMBULATORY_CARE_PROVIDER_SITE_OTHER): Payer: 59 | Admitting: Osteopathic Medicine

## 2015-03-26 VITALS — BP 202/108 | HR 72 | Ht <= 58 in | Wt 109.0 lb

## 2015-03-26 DIAGNOSIS — R7989 Other specified abnormal findings of blood chemistry: Secondary | ICD-10-CM

## 2015-03-26 DIAGNOSIS — R079 Chest pain, unspecified: Secondary | ICD-10-CM

## 2015-03-26 DIAGNOSIS — I10 Essential (primary) hypertension: Secondary | ICD-10-CM | POA: Diagnosis not present

## 2015-03-26 DIAGNOSIS — R809 Proteinuria, unspecified: Secondary | ICD-10-CM | POA: Diagnosis not present

## 2015-03-26 DIAGNOSIS — R946 Abnormal results of thyroid function studies: Secondary | ICD-10-CM

## 2015-03-26 DIAGNOSIS — R319 Hematuria, unspecified: Secondary | ICD-10-CM

## 2015-03-26 DIAGNOSIS — I160 Hypertensive urgency: Secondary | ICD-10-CM

## 2015-03-26 LAB — CBC WITH DIFFERENTIAL/PLATELET
Basophils Absolute: 0 10*3/uL (ref 0.0–0.1)
Basophils Relative: 0 % (ref 0–1)
Eosinophils Absolute: 0 10*3/uL (ref 0.0–0.7)
Eosinophils Relative: 0 % (ref 0–5)
HEMATOCRIT: 43.6 % (ref 36.0–46.0)
HEMOGLOBIN: 14.4 g/dL (ref 12.0–15.0)
Lymphocytes Relative: 23 % (ref 12–46)
Lymphs Abs: 2.1 10*3/uL (ref 0.7–4.0)
MCH: 31.1 pg (ref 26.0–34.0)
MCHC: 33 g/dL (ref 30.0–36.0)
MCV: 94.2 fL (ref 78.0–100.0)
MONO ABS: 0.5 10*3/uL (ref 0.1–1.0)
MPV: 8.3 fL — AB (ref 8.6–12.4)
Monocytes Relative: 6 % (ref 3–12)
Neutro Abs: 6.5 10*3/uL (ref 1.7–7.7)
Neutrophils Relative %: 71 % (ref 43–77)
Platelets: 308 10*3/uL (ref 150–400)
RBC: 4.63 MIL/uL (ref 3.87–5.11)
RDW: 13.2 % (ref 11.5–15.5)
WBC: 9.1 10*3/uL (ref 4.0–10.5)

## 2015-03-26 LAB — COMPLETE METABOLIC PANEL WITH GFR
ALBUMIN: 4.1 g/dL (ref 3.6–5.1)
ALK PHOS: 55 U/L (ref 33–115)
ALT: 12 U/L (ref 6–29)
AST: 14 U/L (ref 10–30)
BILIRUBIN TOTAL: 0.7 mg/dL (ref 0.2–1.2)
BUN: 13 mg/dL (ref 7–25)
CALCIUM: 8.8 mg/dL (ref 8.6–10.2)
CO2: 26 mmol/L (ref 20–31)
Chloride: 104 mmol/L (ref 98–110)
Creat: 1.05 mg/dL (ref 0.50–1.10)
GFR, Est African American: 76 mL/min (ref >=60)
GFR, Est Non African American: 66 mL/min (ref >=60)
Glucose, Bld: 86 mg/dL (ref 65–99)
POTASSIUM: 3.6 mmol/L (ref 3.5–5.3)
SODIUM: 138 mmol/L (ref 135–146)
Total Protein: 6.5 g/dL (ref 6.1–8.1)

## 2015-03-26 LAB — LIPID PANEL
Cholesterol: 289 mg/dL — ABNORMAL HIGH (ref 125–200)
HDL: 48 mg/dL (ref >=46)
LDL Cholesterol: 202 mg/dL — ABNORMAL HIGH (ref <130)
Total CHOL/HDL Ratio: 6 Ratio — ABNORMAL HIGH (ref <=5.0)
Triglycerides: 197 mg/dL — ABNORMAL HIGH (ref <150)
VLDL: 39 mg/dL — ABNORMAL HIGH (ref <30)

## 2015-03-26 MED ORDER — LISINOPRIL-HYDROCHLOROTHIAZIDE 20-25 MG PO TABS: 1.0000 | ORAL_TABLET | Freq: Every day | ORAL | Status: DC

## 2015-03-26 MED ORDER — ASPIRIN 81 MG PO CHEW: 81.0000 mg | CHEWABLE_TABLET | Freq: Every day | ORAL | Status: DC

## 2015-03-26 MED ORDER — NITROGLYCERIN 0.3 MG SL SUBL: 0.3000 mg | SUBLINGUAL_TABLET | SUBLINGUAL | Status: DC | PRN

## 2015-03-26 NOTE — Progress Notes (Signed)
HPI: Jillian Lee is a 39 y.o. female who presents to [place]  today for chief complaint of:  Chief Complaint  Patient presents with  . Hypertension   Hypertension: Recently diagnosed with blood pressure systolic 735H diastolic greater than 299 after seeking care for UTI in urgent care. She was told to establish care with PCP to address high blood pressure.  Occasional chest pain . Location: substernal . Quality: tightnes . Severity: mild . Duration: few minutes . Modifying factors: new diagnosis HTN . Assoc signs/symptoms: no SOB, nausea, vision change    Past medical, social and family history reviewed: Past Medical History  Diagnosis Date  . History of kidney stones   . GERD (gastroesophageal reflux disease)   . Anemia   . Ingrown right big toenail 1998  . White coat syndrome without diagnosis of hypertension   . Hypercholesterolemia   . HSV (herpes simplex virus) infection    Past Surgical History  Procedure Laterality Date  . Toenail avulsion     Social History  Substance Use Topics  . Smoking status: Never Smoker   . Smokeless tobacco: Never Used  . Alcohol Use: No   Family History  Problem Relation Age of Onset  . Anxiety disorder Mother   . Hypertension Mother   . Dementia Mother     Current Outpatient Prescriptions  Medication Sig Dispense Refill  . cetirizine (ZYRTEC) 10 MG tablet Take 10 mg by mouth at bedtime.     . ferrous sulfate 325 (65 FE) MG tablet Take 325 mg by mouth daily with breakfast.    . fluticasone (FLONASE) 50 MCG/ACT nasal spray Place 1 spray into both nostrils daily as needed for allergies or rhinitis.    Marland Kitchen levonorgestrel-ethinyl estradiol (AVIANE,ALESSE,LESSINA) 0.1-20 MG-MCG tablet Take 1 tablet by mouth daily.    . Multiple Vitamins-Minerals (MULTIVITAMIN WITH MINERALS) tablet Take 1 tablet by mouth daily.    . nitrofurantoin, macrocrystal-monohydrate, (MACROBID) 100 MG capsule Take 1 capsule  (100 mg total) by mouth 2 (two) times daily. Take with food. 14 capsule 0  . valACYclovir (VALTREX) 500 MG tablet Take 500 mg by mouth daily.     No current facility-administered medications for this visit.   Allergies  Allergen Reactions  . Biaxin [Clarithromycin] Other (See Comments)    Doesn't feel well      Review of Systems: CONSTITUTIONAL: Neg fever/chills, no unintentional weight changes HEAD/EYES/EARS/NOSE/THROAT: No headache/vision change or hearing change, no sore throat CARDIAC: No chest pain/pressure/palpitations, no orthopnea at this time. Ocasional chest pain as noted inHPI RESPIRATORY: No cough/shortness of breath/wheeze GASTROINTESTINAL: No nausea/vomiting/abdominal pain/blood in stool/diarrhea/constipation MUSCULOSKELETAL: No myalgia/arthralgia GENITOURINARY: No incontinence, No abnormal genital bleeding/discharge SKIN: No rash/wounds/concerning lesions HEM/ONC: No easy bruising/bleeding, no abnormal lymph node ENDOCRINE: No polyuria/polydipsia/polyphagia, no heat/cold intolerance  NEUROLOGIC: No weakness/dizzines/slurred speech PSYCHIATRIC: No concerns with depression/or sleep problems she does report anxiety   Exam:  LMP 03/08/2015 Constitutional: VSS, see above. General Appearance: alert, well-developed, well-nourished, NAD Eyes: Normal lids and conjunctive, non-icteric sclera, PERRLA Ears, Nose, Mouth, Throat: Normal external inspection ears/nares/mouth/lips/gums, Normal TM bilaterally, MMM, posterior pharynx without erythema/exudate Neck: No masses, trachea midline. No thyroid enlargement/tenderness/mass appreciated Respiratory: Normal respiratory effort. no wheeze/rhonchi/rales Cardiovascular: S1/S2 normal, no murmur/rub/gallop auscultated. RRR. No carotid bruit or JVD. No abdominal aortic bruit. Pedal pulse II/IV bilaterally DP and PT. No lower extremity edema. Gastrointestinal: Nontender, no masses. No hepatomegaly, no splenomegaly. No hernia appreciated.  Bowel sounds normal. Rectal exam deferred.  Musculoskeletal: Gait  normal. No clubbing/cyanosis of digits.  Neurological: No cranial nerve deficit on limited exam. Motor and sensation intact and symmetric Psychiatric: Normal judgment/insight. Normal mood and affect. Oriented x3.     ASSESSMENT/PLAN:  Hypertensive urgency - Plan: CBC with Differential/Platelet, COMPLETE METABOLIC PANEL WITH GFR, TSH, Lipid panel, US Renal Artery Stenosis, Urinalysis, Ambulatory referral to Sleep Studies, lisinopril-hydrochlorothiazide (PRINZIDE,ZESTORETIC) 20-25 MG per tablet, aspirin 81 MG chewable tablet  Chest pain, unspecified chest pain type - Plan: nitroGLYCERIN (NITROSTAT) 0.3 MG SL tablet  EKG interpretation: Rate: 66 Rhythm: NSR No ST/T changes concerning for acute ischemia/infarct  LVH  ypertensive urgency without current evidence of end organ damage, will review labs as soon as available, ER precautions were emphasized to the patient with regard to chest pain particularly any chest pain accompanied by difficulty breathing, diaphoresis, any other concerns. Started on antihypertensive medication and aspirin, nitroglycerin as needed. Given young age and relatively low risk given her family history, we'll begin workup for secondary causes of hypertension. Patient encouraged her on Monday for blood pressure check, she says she will be able to make it into the office that she works at [employer] checked there, encouraged her to go to employee health for somewhere that her blood pressure should be entered into her chart , she can call me with this blood pressure as well. Advised importance of close follow-up. In review of previous vital signs she has shown evidence of hypertension as early as 2015 however she was never started on any antihypertensive medication. I advised her that starting this medication today would not likely bring her blood pressure into the normal range and she would need further  adjustment, I do not want to drop it too quickly given that it has been so elevated for such a long time. Importance of close follow-up was discussed with the patient. She is making an appointment for next Wednesday to see me for further discussion and possible medication adjustment, will need to discuss alternate birth control as well, this being a relatively contraindicated medicine with HTN dx, will se if BP improves on meds and will review labs

## 2015-03-26 NOTE — Patient Instructions (Signed)
You need to go to an emergency room right away if you are having any concerning chest pain. You need to come back on Monday or Tuesday to recheck blood pressure with a nurse.  Start new medicine today for blood pressure and aspirin.  Nitro pills were also prescribed as needed for chest pain.  We will call you if anything serious shows up on blood work, we will discuss all blood test results at your next visit in detail.  You should hear about the following appointments within the next week: if you don't hear about these please call our office 1. Ultrasound of kidneys 2. Sleep study  Any questions or concerns, please let us know!

## 2015-03-26 NOTE — Progress Notes (Signed)
Duplicate lab order printed, confirmed with the patient that she has had her blood drawn today. Also advised stop birth-control, we can discuss alternative contraception at her next visit

## 2015-03-27 LAB — TSH: TSH: 5.448 u[IU]/mL — AB (ref 0.350–4.500)

## 2015-03-27 LAB — URINALYSIS
BILIRUBIN URINE: NEGATIVE
Glucose, UA: NEGATIVE
Ketones, ur: NEGATIVE
Nitrite: NEGATIVE
Specific Gravity, Urine: 1.013 (ref 1.001–1.035)
pH: 7 (ref 5.0–8.0)

## 2015-03-29 LAB — T4, FREE: Free T4: 0.89 ng/dL (ref 0.80–1.80)

## 2015-03-29 NOTE — Addendum Note (Signed)
Addended by: Maryla Morrow on: 03/29/2015 08:28 AM   Modules accepted: Orders

## 2015-03-30 LAB — THYROID PEROXIDASE ANTIBODY: Thyroperoxidase Ab SerPl-aCnc: 802 IU/mL — ABNORMAL HIGH (ref <9)

## 2015-03-31 ENCOUNTER — Ambulatory Visit (INDEPENDENT_AMBULATORY_CARE_PROVIDER_SITE_OTHER): Payer: 59 | Admitting: Osteopathic Medicine

## 2015-03-31 VITALS — BP 138/80 | HR 79

## 2015-03-31 DIAGNOSIS — I10 Essential (primary) hypertension: Secondary | ICD-10-CM | POA: Diagnosis not present

## 2015-03-31 DIAGNOSIS — E038 Other specified hypothyroidism: Secondary | ICD-10-CM | POA: Diagnosis not present

## 2015-03-31 DIAGNOSIS — E034 Atrophy of thyroid (acquired): Secondary | ICD-10-CM

## 2015-03-31 DIAGNOSIS — E039 Hypothyroidism, unspecified: Secondary | ICD-10-CM | POA: Insufficient documentation

## 2015-03-31 HISTORY — DX: Hypothyroidism, unspecified: E03.9

## 2015-03-31 MED ORDER — LEVOTHYROXINE SODIUM 50 MCG PO TABS: 50.0000 ug | ORAL_TABLET | Freq: Every day | ORAL | Status: DC

## 2015-03-31 NOTE — Progress Notes (Signed)
HPI: Jillian Lee is a 39 y.o. female who presents to [place]  today for chief complaint of:  Chief Complaint  Patient presents with  . Hypertension    HTN - BP improved on lisinopril, pt feels better overall but still fatigue.  HYPOTHRYOID - new diagnosis - could explain fatigue  Labs reviewed with patient - she was not fasting therefore lipids are abnormal  Past medical, social and family history reviewed: Past Medical History  Diagnosis Date  . History of kidney stones   . GERD (gastroesophageal reflux disease)   . Anemia   . Ingrown right big toenail 1998  . White coat syndrome without diagnosis of hypertension   . Hypercholesterolemia   . HSV (herpes simplex virus) infection   . Hypertensive urgency 03/26/2015   Past Surgical History  Procedure Laterality Date  . Toenail avulsion     Social History  Substance Use Topics  . Smoking status: Never Smoker   . Smokeless tobacco: Never Used  . Alcohol Use: No   Family History  Problem Relation Age of Onset  . Anxiety disorder Mother   . Hypertension Mother   . Dementia Mother   . Alcohol abuse Mother   . Depression Mother   . Stroke Mother   . Alcohol abuse Maternal Uncle   . Diabetes Maternal Uncle   . Hypertension Maternal Uncle   . Cancer Maternal Grandmother   . Diabetes Maternal Grandmother     Current Outpatient Prescriptions  Medication Sig Dispense Refill  . aspirin 81 MG chewable tablet Chew 1 tablet (81 mg total) by mouth daily. 90 tablet 1  . cetirizine (ZYRTEC) 10 MG tablet Take 10 mg by mouth at bedtime.     . ferrous sulfate 325 (65 FE) MG tablet Take 325 mg by mouth daily with breakfast.    . fluticasone (FLONASE) 50 MCG/ACT nasal spray Place 1 spray into both nostrils daily as needed for allergies or rhinitis.    Marland Kitchen levonorgestrel-ethinyl estradiol (AVIANE,ALESSE,LESSINA) 0.1-20 MG-MCG tablet Take 1 tablet by mouth daily.    Marland Kitchen  lisinopril-hydrochlorothiazide (PRINZIDE,ZESTORETIC) 20-25 MG per tablet Take 1 tablet by mouth daily. 30 tablet 1  . Multiple Vitamins-Minerals (MULTIVITAMIN WITH MINERALS) tablet Take 1 tablet by mouth daily.    . nitroGLYCERIN (NITROSTAT) 0.3 MG SL tablet Place 1 tablet (0.3 mg total) under the tongue every 5 (five) minutes as needed for chest pain. 15 tablet 12  . valACYclovir (VALTREX) 500 MG tablet Take 500 mg by mouth daily.     No current facility-administered medications for this visit.   Allergies  Allergen Reactions  . Biaxin [Clarithromycin] Other (See Comments)    Doesn't feel well      Review of Systems: CONSTITUTIONAL: Neg fever/chills, no unintentional weight changes HEAD/EYES/EARS/NOSE/THROAT: No headache/vision change or hearing change, no sore throat CARDIAC: No chest pain/pressure/palpitations, no orthopnea RESPIRATORY: No cough/shortness of breath/wheeze ENDOCRINE: No polyuria/polydipsia/polyphagia, no heat/cold intolerance (+) fatigue  Exam:  BP 138/80 mmHg  Pulse 79  SpO2 100%  LMP 03/08/2015 Constitutional: VSS, see above. General Appearance: alert, well-developed, well-nourished, NAD Psychiatric: Normal judgment/insight. Normal mood and affect. Oriented x3.    Results for orders placed or performed in visit on 03/26/15 (from the past 72 hour(s))  T4, free     Status: None   Collection Time: 03/29/15  7:58 AM  Result Value Ref Range   Free T4 0.89 0.80 - 1.80 ng/dL  Thyroid peroxidase antibody     Status:  Abnormal   Collection Time: 03/29/15  7:58 AM  Result Value Ref Range   Thyroperoxidase Ab SerPl-aCnc 802 (H) <9 IU/mL      ASSESSMENT/PLAN:   Hypothyroidism due to acquired atrophy of thyroid - Plan: levothyroxine (SYNTHROID, LEVOTHROID) 50 MCG tablet  Essential hypertension, benign  Repeat labs and BP check 8 weeks: TSH, CMP, Lipids  Total time spent 15 minutes, greater than 50% of the visit was counseling and coordinating care for  diagnosis of hypothyroid, hypertension.

## 2015-05-24 ENCOUNTER — Other Ambulatory Visit: Payer: Self-pay | Admitting: Osteopathic Medicine

## 2015-05-31 ENCOUNTER — Ambulatory Visit: Payer: Self-pay | Admitting: Osteopathic Medicine

## 2015-05-31 ENCOUNTER — Telehealth: Payer: Self-pay

## 2015-05-31 ENCOUNTER — Other Ambulatory Visit: Payer: Self-pay | Admitting: Osteopathic Medicine

## 2015-05-31 DIAGNOSIS — E034 Atrophy of thyroid (acquired): Secondary | ICD-10-CM

## 2015-05-31 MED ORDER — LEVOTHYROXINE SODIUM 50 MCG PO TABS: 50.0000 ug | ORAL_TABLET | Freq: Every day | ORAL | Status: DC

## 2015-05-31 NOTE — Telephone Encounter (Signed)
Refill request for Levothyroxine 50 mcg sent to [facility]. Patient has upcoming appt for 06/09/15. Rhonda Cunningham,CMA

## 2015-06-09 ENCOUNTER — Ambulatory Visit (INDEPENDENT_AMBULATORY_CARE_PROVIDER_SITE_OTHER): Payer: 59 | Admitting: Osteopathic Medicine

## 2015-06-09 ENCOUNTER — Encounter: Payer: Self-pay | Admitting: Osteopathic Medicine

## 2015-06-09 VITALS — BP 125/62 | HR 77 | Ht <= 58 in | Wt 113.0 lb

## 2015-06-09 DIAGNOSIS — J019 Acute sinusitis, unspecified: Secondary | ICD-10-CM

## 2015-06-09 DIAGNOSIS — B9689 Other specified bacterial agents as the cause of diseases classified elsewhere: Secondary | ICD-10-CM

## 2015-06-09 DIAGNOSIS — E038 Other specified hypothyroidism: Secondary | ICD-10-CM

## 2015-06-09 DIAGNOSIS — R05 Cough: Secondary | ICD-10-CM | POA: Diagnosis not present

## 2015-06-09 DIAGNOSIS — I10 Essential (primary) hypertension: Secondary | ICD-10-CM

## 2015-06-09 DIAGNOSIS — R059 Cough, unspecified: Secondary | ICD-10-CM

## 2015-06-09 DIAGNOSIS — E034 Atrophy of thyroid (acquired): Secondary | ICD-10-CM

## 2015-06-09 DIAGNOSIS — E785 Hyperlipidemia, unspecified: Secondary | ICD-10-CM

## 2015-06-09 DIAGNOSIS — Z5181 Encounter for therapeutic drug level monitoring: Secondary | ICD-10-CM

## 2015-06-09 LAB — COMPLETE METABOLIC PANEL WITH GFR
ALT: 14 U/L (ref 6–29)
AST: 15 U/L (ref 10–30)
Albumin: 3.7 g/dL (ref 3.6–5.1)
Alkaline Phosphatase: 52 U/L (ref 33–115)
BILIRUBIN TOTAL: 0.5 mg/dL (ref 0.2–1.2)
BUN: 13 mg/dL (ref 7–25)
CALCIUM: 8.5 mg/dL — AB (ref 8.6–10.2)
CHLORIDE: 100 mmol/L (ref 98–110)
CO2: 26 mmol/L (ref 20–31)
CREATININE: 0.88 mg/dL (ref 0.50–1.10)
GFR, EST NON AFRICAN AMERICAN: 82 mL/min (ref >=60)
Glucose, Bld: 113 mg/dL — ABNORMAL HIGH (ref 65–99)
Potassium: 3.6 mmol/L (ref 3.5–5.3)
Sodium: 137 mmol/L (ref 135–146)
TOTAL PROTEIN: 6.2 g/dL (ref 6.1–8.1)

## 2015-06-09 LAB — LIPID PANEL
Cholesterol: 252 mg/dL — ABNORMAL HIGH (ref 125–200)
HDL: 50 mg/dL (ref >=46)
LDL CALC: 169 mg/dL — AB (ref <130)
TRIGLYCERIDES: 164 mg/dL — AB (ref <150)
Total CHOL/HDL Ratio: 5 Ratio (ref <=5.0)
VLDL: 33 mg/dL — ABNORMAL HIGH (ref <30)

## 2015-06-09 LAB — TSH: TSH: 1.986 u[IU]/mL (ref 0.350–4.500)

## 2015-06-09 MED ORDER — GUAIFENESIN-CODEINE 100-10 MG/5ML PO SYRP: 5.0000 mL | ORAL_SOLUTION | Freq: Three times a day (TID) | ORAL | Status: DC | PRN

## 2015-06-09 MED ORDER — AMOXICILLIN-POT CLAVULANATE 875-125 MG PO TABS: 1.0000 | ORAL_TABLET | Freq: Two times a day (BID) | ORAL | Status: DC

## 2015-06-09 NOTE — Progress Notes (Signed)
HPI: Jillian Lee is a 39 y.o. female who presents to [place]  today for chief complaint of:  Chief Complaint  Patient presents with  . Follow-up    THYROID   Patient is seen 03/30/2014, for treatment of blood pressure and hypothyroidism. Patient due today for blood pressure recheck and repeat thyroid labs. Also repeat lipid panel, patient was not fasting at last lab draw lipids were abnormal.  Illness.sick: . Location: sinuses, chest . Quality: coughing, sinus congestion . Severity: moderate  . Duration: 1 week, was feeling better but now worse again . Timing: worse in morning  . Modifying factors: no OTC meds tried.     Past medical, social and family history reviewed: Past Medical History  Diagnosis Date  . History of kidney stones   . GERD (gastroesophageal reflux disease)   . Anemia   . Ingrown right big toenail 1998  . White coat syndrome without diagnosis of hypertension   . Hypercholesterolemia   . HSV (herpes simplex virus) infection   . Hypertensive urgency 03/26/2015  . Hypothyroidism 03/31/2015   Past Surgical History  Procedure Laterality Date  . Toenail avulsion     Social History  Substance Use Topics  . Smoking status: Never Smoker   . Smokeless tobacco: Never Used  . Alcohol Use: No   Family History  Problem Relation Age of Onset  . Anxiety disorder Mother   . Hypertension Mother   . Dementia Mother   . Alcohol abuse Mother   . Depression Mother   . Stroke Mother   . Alcohol abuse Maternal Uncle   . Diabetes Maternal Uncle   . Hypertension Maternal Uncle   . Cancer Maternal Grandmother   . Diabetes Maternal Grandmother     Current Outpatient Prescriptions  Medication Sig Dispense Refill  . aspirin 81 MG chewable tablet Chew 1 tablet (81 mg total) by mouth daily. 90 tablet 1  . cetirizine (ZYRTEC) 10 MG tablet Take 10 mg by mouth at bedtime.     . ferrous sulfate 325 (65 FE) MG tablet Take 325 mg by  mouth daily with breakfast.    . fluticasone (FLONASE) 50 MCG/ACT nasal spray Place 1 spray into both nostrils daily as needed for allergies or rhinitis.    Marland Kitchen levonorgestrel-ethinyl estradiol (AVIANE,ALESSE,LESSINA) 0.1-20 MG-MCG tablet Take 1 tablet by mouth daily.    Marland Kitchen levothyroxine (SYNTHROID, LEVOTHROID) 50 MCG tablet Take 1 tablet (50 mcg total) by mouth daily. 30 tablet 1  . lisinopril-hydrochlorothiazide (PRINZIDE,ZESTORETIC) 20-25 MG tablet Take 1 tablet by mouth daily.    . Multiple Vitamins-Minerals (MULTIVITAMIN WITH MINERALS) tablet Take 1 tablet by mouth daily.    . nitroGLYCERIN (NITROSTAT) 0.3 MG SL tablet Place 1 tablet (0.3 mg total) under the tongue every 5 (five) minutes as needed for chest pain. 15 tablet 12  . valACYclovir (VALTREX) 500 MG tablet Take 500 mg by mouth daily.     No current facility-administered medications for this visit.   Allergies  Allergen Reactions  . Biaxin [Clarithromycin] Other (See Comments)    Doesn't feel well      Review of Systems: CONSTITUTIONAL:  No  fever, no chills, No  unintentional weight changes HEAD/EYES/EARS/NOSE/THROAT: No headache, no vision change, no hearing change, Yes  sore throat CARDIAC: No chest pain, no pressure/palpitations, no orthopnea RESPIRATORY: Yes  cough, No  shortness of breath/wheeze GASTROINTESTINAL: No nausea, no vomiting, no abdominal pain, no blood in stool, no diarrhea, no constipation MUSCULOSKELETAL: No  myalgia/arthralgia   Exam:  BP 125/62 mmHg  Pulse 77  Ht 4' 9"  (1.448 m)  Wt 113 lb (51.256 kg)  BMI 24.45 kg/m2 Constitutional: VSS, see above. General Appearance: alert, well-developed, well-nourished, NAD Eyes: Normal lids and conjunctive, non-icteric sclera, PERRLA Ears, Nose, Mouth, Throat: Normal external inspection ears/nares/mouth/lips/gums, TM normal bilaterally, MMM, posterior pharynx Yes  erythema No  exudate Neck: No masses, trachea midline. No thyroid enlargement/tenderness/mass  appreciated. No lymphadenopathy Respiratory: Normal respiratory effort. no wheeze, no rhonchi, no rales Cardiovascular: S1/S2 normal, no murmur, no rub/gallop auscultated. RRR.    No results found for this or any previous visit (from the past 72 hour(s)).    ASSESSMENT/PLAN: Fasting labs, RTC if no better with coughing may need CXR  Acute bacterial rhinosinusitis - Plan: amoxicillin-clavulanate (AUGMENTIN) 875-125 MG tablet  Cough - Plan: guaiFENesin-codeine (ROBITUSSIN AC) 100-10 MG/5ML syrup  Hypothyroidism due to acquired atrophy of thyroid - Plan: TSH  Essential hypertension, benign  Hyperlipidemia - Plan: Lipid panel  Medication monitoring encounter - Plan: TSH, COMPLETE METABOLIC PANEL WITH GFR   Return in about 4 months (around 10/07/2015) for BLOOD PRESSURE FOLLOWUP.

## 2015-06-18 ENCOUNTER — Other Ambulatory Visit: Payer: Self-pay | Admitting: Osteopathic Medicine

## 2015-06-18 DIAGNOSIS — Z5181 Encounter for therapeutic drug level monitoring: Secondary | ICD-10-CM

## 2015-06-18 DIAGNOSIS — I10 Essential (primary) hypertension: Secondary | ICD-10-CM

## 2015-06-18 LAB — LIPID PANEL
CHOL/HDL RATIO: 4.6 ratio (ref <=5.0)
Cholesterol: 251 mg/dL — ABNORMAL HIGH (ref 125–200)
HDL: 54 mg/dL (ref >=46)
LDL CALC: 175 mg/dL — AB (ref <130)
Triglycerides: 109 mg/dL (ref <150)
VLDL: 22 mg/dL (ref <30)

## 2015-06-18 LAB — COMPLETE METABOLIC PANEL WITH GFR
ALT: 15 U/L (ref 6–29)
AST: 16 U/L (ref 10–30)
Albumin: 3.6 g/dL (ref 3.6–5.1)
Alkaline Phosphatase: 46 U/L (ref 33–115)
BUN: 16 mg/dL (ref 7–25)
CHLORIDE: 101 mmol/L (ref 98–110)
CO2: 25 mmol/L (ref 20–31)
CREATININE: 0.88 mg/dL (ref 0.50–1.10)
Calcium: 8.7 mg/dL (ref 8.6–10.2)
GFR, Est African American: >89 mL/min (ref >=60)
GFR, Est Non African American: 82 mL/min (ref >=60)
GLUCOSE: 91 mg/dL (ref 65–99)
POTASSIUM: 3.8 mmol/L (ref 3.5–5.3)
SODIUM: 137 mmol/L (ref 135–146)
Total Bilirubin: 0.6 mg/dL (ref 0.2–1.2)
Total Protein: 6.3 g/dL (ref 6.1–8.1)

## 2015-06-18 LAB — HEMOGLOBIN A1C
HEMOGLOBIN A1C: 5.7 % — AB (ref <5.7)
MEAN PLASMA GLUCOSE: 117 mg/dL — AB (ref <117)

## 2015-07-22 ENCOUNTER — Other Ambulatory Visit: Payer: Self-pay | Admitting: Osteopathic Medicine

## 2015-07-22 MED FILL — LISINOPRIL-HCTZ 20-25 MG TA: 20-25 | 30 days supply | Qty: 30 | Fill #0

## 2015-07-27 ENCOUNTER — Telehealth: Payer: Self-pay | Admitting: Osteopathic Medicine

## 2015-07-27 NOTE — Telephone Encounter (Signed)
I received FMLA forms for the patient but she and I did not discuss prolonged time off work at her last visit in November. Please call and ask her to schedule visit with me so she can let me know what exactly needs to be put on this paperwork for it to get approved. She needs to bring info on exact dates she was off work and returned to work.

## 2015-07-27 NOTE — Telephone Encounter (Signed)
Left detailed message on patient vm advising that she needs to schedule an appointment to have paperwork filled out. Rhonda Cunningham,CMA

## 2015-07-29 ENCOUNTER — Other Ambulatory Visit: Payer: Self-pay | Admitting: Osteopathic Medicine

## 2015-07-29 MED FILL — LEVOTHYROXINE 50 MCG TABLET: 50 | 30 days supply | Qty: 30 | Fill #0

## 2015-08-11 MED FILL — VALACYCLOVIR HCL 500 MG TAB: 500 | 30 days supply | Qty: 30 | Fill #7

## 2015-08-19 ENCOUNTER — Telehealth: Payer: Self-pay | Admitting: Osteopathic Medicine

## 2015-08-19 ENCOUNTER — Other Ambulatory Visit: Payer: Self-pay | Admitting: Osteopathic Medicine

## 2015-08-19 MED FILL — LISINOPRIL-HCTZ 20-25 MG TA: 20-25 | 30 days supply | Qty: 30 | Fill #0

## 2015-08-19 NOTE — Telephone Encounter (Signed)
I called pt and left her message asking her to call our office to schedule her f/u appointment with Dr.Alexander BEFORE September 16, 2015  Thanks, Anderson Malta todd

## 2015-08-30 MED FILL — LEVOTHYROXINE 50 MCG TABLET: 50 | 30 days supply | Qty: 30 | Fill #1

## 2015-09-09 MED FILL — VALACYCLOVIR HCL 500 MG TAB: 500 | 30 days supply | Qty: 30 | Fill #8

## 2015-09-09 MED FILL — LEVONOR-ETH ESTRAD 0.1-0.02: 0.1-20 | 56 days supply | Qty: 56 | Fill #4

## 2015-09-10 ENCOUNTER — Ambulatory Visit (INDEPENDENT_AMBULATORY_CARE_PROVIDER_SITE_OTHER): Payer: 59 | Admitting: Osteopathic Medicine

## 2015-09-10 ENCOUNTER — Encounter: Payer: Self-pay | Admitting: Osteopathic Medicine

## 2015-09-10 VITALS — BP 137/65 | HR 79 | Ht <= 58 in | Wt 116.0 lb

## 2015-09-10 DIAGNOSIS — I10 Essential (primary) hypertension: Secondary | ICD-10-CM | POA: Diagnosis not present

## 2015-09-10 DIAGNOSIS — Z5181 Encounter for therapeutic drug level monitoring: Secondary | ICD-10-CM | POA: Diagnosis not present

## 2015-09-10 DIAGNOSIS — Z3041 Encounter for surveillance of contraceptive pills: Secondary | ICD-10-CM

## 2015-09-10 DIAGNOSIS — R946 Abnormal results of thyroid function studies: Secondary | ICD-10-CM

## 2015-09-10 DIAGNOSIS — E039 Hypothyroidism, unspecified: Secondary | ICD-10-CM

## 2015-09-10 DIAGNOSIS — R7989 Other specified abnormal findings of blood chemistry: Secondary | ICD-10-CM

## 2015-09-10 DIAGNOSIS — E785 Hyperlipidemia, unspecified: Secondary | ICD-10-CM

## 2015-09-10 LAB — LIPID PANEL
Cholesterol: 258 mg/dL — ABNORMAL HIGH (ref 125–200)
HDL: 53 mg/dL
LDL Cholesterol: 177 mg/dL — ABNORMAL HIGH
Total CHOL/HDL Ratio: 4.9 ratio
Triglycerides: 138 mg/dL
VLDL: 28 mg/dL

## 2015-09-10 LAB — COMPLETE METABOLIC PANEL WITHOUT GFR
ALT: 15 U/L (ref 6–29)
AST: 16 U/L (ref 10–30)
Albumin: 4.1 g/dL (ref 3.6–5.1)
Alkaline Phosphatase: 57 U/L (ref 33–115)
BUN: 19 mg/dL (ref 7–25)
CO2: 25 mmol/L (ref 20–31)
Calcium: 8.9 mg/dL (ref 8.6–10.2)
Chloride: 99 mmol/L (ref 98–110)
Creat: 0.9 mg/dL (ref 0.50–1.10)
GFR, Est African American: >89 mL/min
GFR, Est Non African American: 80 mL/min
Glucose, Bld: 110 mg/dL — ABNORMAL HIGH (ref 65–99)
Potassium: 3.5 mmol/L (ref 3.5–5.3)
Sodium: 136 mmol/L (ref 135–146)
Total Bilirubin: 0.3 mg/dL (ref 0.2–1.2)
Total Protein: 6.7 g/dL (ref 6.1–8.1)

## 2015-09-10 LAB — TSH: TSH: 1.21 m[IU]/L

## 2015-09-10 MED ORDER — LEVOTHYROXINE SODIUM 50 MCG PO TABS: 50.0000 ug | ORAL_TABLET | Freq: Every day | ORAL | Status: DC

## 2015-09-10 MED ORDER — LISINOPRIL-HYDROCHLOROTHIAZIDE 20-25 MG PO TABS: 1.0000 | ORAL_TABLET | Freq: Every day | ORAL | Status: DC

## 2015-09-10 MED ORDER — LEVONORGESTREL-ETHINYL ESTRAD 0.1-20 MG-MCG PO TABS: 1.0000 | ORAL_TABLET | Freq: Every day | ORAL | Status: DC

## 2015-09-10 MED FILL — LISINOPRIL-HCTZ 20-25 MG TA: 20-25 | 30 days supply | Qty: 30 | Fill #0

## 2015-09-10 NOTE — Progress Notes (Signed)
HPI: Jillian Lee is a 40 y.o. female who presents to [facility] today for chief complaint of:  Chief Complaint  Patient presents with  . Follow-up    THYROID, BLOOD PRESSURE AND BIRTH CONTROL    THYROID: Due for repeat TSH  HTN: controlled on current meds  BIRTH CONTROL: doing well on this, but needs refill, was only given few months supply of refills  FMLA FORMS / KIDNEY STONES: needs FMLA forms filled out, needs to miss work on occasion due to recurrent kidney stone pain. Previously evaluated by Urology but can't remember name of the doctor, only note I see if ER consult from an NP working with Urology team.   PREDIABETES: Borderline, A1C 5.7 2 mos ago, patient has made no effort in terms of diet or lifestyle changes, particularly with regard to drink lots of soda/energy drinks, poor diet.   Past medical, social and family history reviewed: Past Medical History  Diagnosis Date  . History of kidney stones   . GERD (gastroesophageal reflux disease)   . Anemia   . Ingrown right big toenail 1998  . White coat syndrome without diagnosis of hypertension   . Hypercholesterolemia   . HSV (herpes simplex virus) infection   . Hypertensive urgency 03/26/2015  . Hypothyroidism 03/31/2015   Past Surgical History  Procedure Laterality Date  . Toenail avulsion     Social History  Substance Use Topics  . Smoking status: Never Smoker   . Smokeless tobacco: Never Used  . Alcohol Use: No   Family History  Problem Relation Age of Onset  . Anxiety disorder Mother   . Hypertension Mother   . Dementia Mother   . Alcohol abuse Mother   . Depression Mother   . Stroke Mother   . Alcohol abuse Maternal Uncle   . Diabetes Maternal Uncle   . Hypertension Maternal Uncle   . Cancer Maternal Grandmother   . Diabetes Maternal Grandmother     Current Outpatient Prescriptions  Medication Sig Dispense Refill  . aspirin 81 MG chewable tablet Chew 1 tablet  (81 mg total) by mouth daily. 90 tablet 1  . cetirizine (ZYRTEC) 10 MG tablet Take 10 mg by mouth at bedtime.     . ferrous sulfate 325 (65 FE) MG tablet Take 325 mg by mouth daily with breakfast.    . fluticasone (FLONASE) 50 MCG/ACT nasal spray Place 1 spray into both nostrils daily as needed for allergies or rhinitis.    Marland Kitchen levonorgestrel-ethinyl estradiol (AVIANE,ALESSE,LESSINA) 0.1-20 MG-MCG tablet Take 1 tablet by mouth daily.    Marland Kitchen levothyroxine (SYNTHROID, LEVOTHROID) 50 MCG tablet Take 1 tablet (50 mcg total) by mouth daily. APPOINTMENT NEEDED FOR FURTHER REFILLS 30 tablet 2  . lisinopril-hydrochlorothiazide (PRINZIDE,ZESTORETIC) 20-25 MG tablet TAKE 1 TABLET BY MOUTH DAILY. APPOINTMENT NEEDED FOR FURTHER REFILLS 30 tablet 0  . Multiple Vitamins-Minerals (MULTIVITAMIN WITH MINERALS) tablet Take 1 tablet by mouth daily.    . nitroGLYCERIN (NITROSTAT) 0.3 MG SL tablet Place 1 tablet (0.3 mg total) under the tongue every 5 (five) minutes as needed for chest pain. 15 tablet 12  . valACYclovir (VALTREX) 500 MG tablet Take 500 mg by mouth daily.     No current facility-administered medications for this visit.   Allergies  Allergen Reactions  . Biaxin [Clarithromycin] Other (See Comments)    Doesn't feel well      Review of Systems: CONSTITUTIONAL:  No  fever, no chills, No  unintentional weight changes CARDIAC:  No  chest pain, No  pressure, No palpitations, No  orthopnea RESPIRATORY: No  cough, No  shortness of breath/wheeze MUSCULOSKELETAL: No  myalgia/arthralgia SKIN: No  rash/wounds/concerning lesions ENDOCRINE: No polyuria/polydipsia/polyphagia, No  heat/cold intolerance     Exam:  BP 137/65 mmHg  Pulse 79  Ht 4' 9"  (1.448 m)  Wt 116 lb (52.617 kg)  BMI 25.10 kg/m2 Constitutional: VS see above. General Appearance: alert, well-developed, well-nourished, NAD Eyes: Normal lids and conjunctive, non-icteric sclera,  Ears, Nose, Mouth, Throat: MMM, Normal external inspection  ears/nares/mouth/lips/gums Neck: No masses, trachea midline. No thyroid enlargement/tenderness/mass appreciated. No lymphadenopathy Respiratory: Normal respiratory effort. no wheeze, no rhonchi, no rales Cardiovascular: S1/S2 normal, no murmur, no rub/gallop auscultated. RRR.  Psychiatric: Normal judgment/insight. Normal mood and affect. Oriented x3.    No results found for this or any previous visit (from the past 72 hour(s)).    ASSESSMENT/PLAN: Patient was asked to confirm name of urologist she was previously seen by, may need to follow-up with them in order for FMLA paperwork to be accepted since I am noting on there that she has been referred to specialist. She'll need to calm you with name of that physician, I'll place official referral if we need to and complete the paperwork at that time.  We'll hold off on A1c rechecked, patient has not been compliant with lifestyle changes, we'll recheck again in another 3-4 months. Patient asked advice regarding nutrition referral, states that she has someone that she would be interested in seeing, I encourage this, let me know if she needs referral.  Otherwise, labs as below for medication monitoring and recheck lipids  Refill birth control for a year   Essential hypertension, benign - Plan: lisinopril-hydrochlorothiazide (PRINZIDE,ZESTORETIC) 20-25 MG tablet  Hyperlipidemia  Elevated TSH  Medication monitoring encounter - Plan: COMPLETE METABOLIC PANEL WITH GFR, TSH, Lipid panel  Hypothyroidism, unspecified hypothyroidism type - Plan: levothyroxine (SYNTHROID, LEVOTHROID) 50 MCG tablet  Encounter for surveillance of contraceptive pills - Plan: levonorgestrel-ethinyl estradiol (AVIANE,ALESSE,LESSINA) 0.1-20 MG-MCG tablet   Return in about 4 months (around 01/10/2016) for HYPERTENSION FOLLOWUP AND WILL RECHECK SUGARS.

## 2015-09-27 MED FILL — LEVOTHYROXINE 50 MCG TABLET: 50 | 30 days supply | Qty: 30 | Fill #2

## 2015-10-14 MED FILL — VALACYCLOVIR HCL 500 MG TAB: 500 | 30 days supply | Qty: 30 | Fill #9

## 2015-10-21 MED FILL — LISINOPRIL-HCTZ 20-25 MG TA: 20-25 | 30 days supply | Qty: 30 | Fill #1

## 2015-10-26 MED FILL — LEVOTHYROXINE 50 MCG TABLET: 50 | 90 days supply | Qty: 90 | Fill #0

## 2015-11-11 MED FILL — VALACYCLOVIR HCL 500 MG TAB: 500 | 30 days supply | Qty: 30 | Fill #10

## 2015-11-11 MED FILL — LEVONOR-ETH ESTRAD 0.1-0.02: 0.1-20 | 84 days supply | Qty: 84 | Fill #0

## 2015-11-19 MED FILL — LISINOPRIL-HCTZ 20-25 MG TA: 20-25 | 90 days supply | Qty: 90 | Fill #2

## 2015-12-01 ENCOUNTER — Ambulatory Visit (INDEPENDENT_AMBULATORY_CARE_PROVIDER_SITE_OTHER): Payer: 59 | Admitting: Osteopathic Medicine

## 2015-12-01 ENCOUNTER — Encounter: Payer: Self-pay | Admitting: Osteopathic Medicine

## 2015-12-01 VITALS — BP 125/80 | HR 71 | Ht <= 58 in | Wt 114.0 lb

## 2015-12-01 DIAGNOSIS — K219 Gastro-esophageal reflux disease without esophagitis: Secondary | ICD-10-CM

## 2015-12-01 DIAGNOSIS — R7303 Prediabetes: Secondary | ICD-10-CM

## 2015-12-01 LAB — POCT GLYCOSYLATED HEMOGLOBIN (HGB A1C): HEMOGLOBIN A1C: 5.5

## 2015-12-01 MED ORDER — RANITIDINE HCL 300 MG PO TABS: 300.0000 mg | ORAL_TABLET | Freq: Every day | ORAL | Status: DC

## 2015-12-01 NOTE — Progress Notes (Signed)
HPI: Jillian Lee is a 40 y.o. female who presents to [facility] today for chief complaint of:  Chief Complaint  Patient presents with  . Heartburn    Reflux . Location: epigastric  . Quality: burning pain   . Modifying factors: previously on Nexium and this caused diarrhea. Worse with overeating.  . Assoc signs/symptoms: Mild abdominal cramping which seems better now  Prediabetes: Patient has been better about stopping energy drinks and sodas, not much else in the way of lifestyle changes.   Past medical, social and family history reviewed: Past Medical History  Diagnosis Date  . History of kidney stones   . GERD (gastroesophageal reflux disease)   . Anemia   . Ingrown right big toenail 1998  . White coat syndrome without diagnosis of hypertension   . Hypercholesterolemia   . HSV (herpes simplex virus) infection   . Hypertensive urgency 03/26/2015  . Hypothyroidism 03/31/2015   Past Surgical History  Procedure Laterality Date  . Toenail avulsion     Social History  Substance Use Topics  . Smoking status: Never Smoker   . Smokeless tobacco: Never Used  . Alcohol Use: No   Family History  Problem Relation Age of Onset  . Anxiety disorder Mother   . Hypertension Mother   . Dementia Mother   . Alcohol abuse Mother   . Depression Mother   . Stroke Mother   . Alcohol abuse Maternal Uncle   . Diabetes Maternal Uncle   . Hypertension Maternal Uncle   . Cancer Maternal Grandmother   . Diabetes Maternal Grandmother     Current Outpatient Prescriptions  Medication Sig Dispense Refill  . aspirin 81 MG chewable tablet Chew 1 tablet (81 mg total) by mouth daily. 90 tablet 1  . cetirizine (ZYRTEC) 10 MG tablet Take 10 mg by mouth at bedtime.     . ferrous sulfate 325 (65 FE) MG tablet Take 325 mg by mouth daily with breakfast.    . fluticasone (FLONASE) 50 MCG/ACT nasal spray Place 1 spray into both nostrils daily as needed for  allergies or rhinitis.    Marland Kitchen levonorgestrel-ethinyl estradiol (AVIANE,ALESSE,LESSINA) 0.1-20 MG-MCG tablet Take 1 tablet by mouth daily. 1 Package 12  . levothyroxine (SYNTHROID, LEVOTHROID) 50 MCG tablet Take 1 tablet (50 mcg total) by mouth daily. 90 tablet 1  . lisinopril-hydrochlorothiazide (PRINZIDE,ZESTORETIC) 20-25 MG tablet Take 1 tablet by mouth daily. 30 tablet 6  . Multiple Vitamins-Minerals (MULTIVITAMIN WITH MINERALS) tablet Take 1 tablet by mouth daily.    . nitroGLYCERIN (NITROSTAT) 0.3 MG SL tablet Place 1 tablet (0.3 mg total) under the tongue every 5 (five) minutes as needed for chest pain. 15 tablet 12  . valACYclovir (VALTREX) 500 MG tablet Take 500 mg by mouth daily.     No current facility-administered medications for this visit.   Allergies  Allergen Reactions  . Biaxin [Clarithromycin] Other (See Comments)    Doesn't feel well      Review of Systems: CONSTITUTIONAL:  No  fever, no chills, No  unintentional weight changes CARDIAC: No  chest pain RESPIRATORY: No  cough, No  shortness of breath/wheeze GASTROINTESTINAL: No  nausea, No  vomiting, (+) mild intermittent abdominal pain, No  blood in stool, (+) occasional diarrhea but this seems to have resolved, No  Constipation, (+) heartburn as noted per history of present illness    Exam:  BP 125/80 mmHg  Pulse 71  Ht 4' 9"  (1.448 m)  Wt 114  lb (51.71 kg)  BMI 24.66 kg/m2 Constitutional: VS see above. General Appearance: alert, well-developed, well-nourished, NAD Eyes: Normal lids and conjunctive, non-icteric sclera, Ears, Nose, Mouth, Throat: MMM, Normal external inspection ears/nares/mouth/lips/gums Neck: No masses, trachea midline. No thyroid enlargement/tenderness/mass appreciated. No lymphadenopathy Respiratory: Normal respiratory effort. no wheeze, no rhonchi, no rales Cardiovascular: S1/S2 normal, no murmur, no rub/gallop auscultated. RRR.  Gastrointestinal: Nontender, no masses. No hepatomegaly, no  splenomegaly. No hernia appreciated. Bowel sounds normal. Rectal exam deferred. Mild tympanic to percussion    Results for orders placed or performed in visit on 12/01/15 (from the past 72 hour(s))  POCT glycosylated hemoglobin (Hb A1C)     Status: None   Collection Time: 12/01/15 11:40 AM  Result Value Ref Range   Hemoglobin A1C 5.5     ASSESSMENT/PLAN: Intermittent heartburn, symptoms at this point consistent with possible viral gastritis, advised viral illness should resolve within 7-10 days if she wants to wait, alternative would be to reinitiate therapy with Zantac, consider testing for H. pylori/escalation to PPI if symptoms persist or worsen. A1c shows improvement  Gastroesophageal reflux disease, esophagitis presence not specified - Plan: ranitidine (ZANTAC) 300 MG tablet  Prediabetes - Plan: POCT glycosylated hemoglobin (Hb A1C)   All questions were answered. Visit summary with updated medication list and pertinent instructions was printed for patient. ER/RTC precautions were reviewed with the patient. Return in about 3 months (around 03/10/2016) for HTN and THYROID follow-up.

## 2015-12-01 NOTE — Patient Instructions (Addendum)
OK to take Zantac (ranitidine) once per day. If symptoms worsen or persist, return to office for further tests and medication options. Any questions, let us know! Any severe pain, seek medical care ASAP!    Food Choices for Gastroesophageal Reflux Disease, Adult When you have gastroesophageal reflux disease (GERD), the foods you eat and your eating habits are very important. Choosing the right foods can help ease the discomfort of GERD. WHAT GENERAL GUIDELINES DO I NEED TO FOLLOW?  Choose fruits, vegetables, whole grains, low-fat dairy products, and low-fat meat, fish, and poultry.  Limit fats such as oils, salad dressings, butter, nuts, and avocado.  Keep a food diary to identify foods that cause symptoms.  Avoid foods that cause reflux. These may be different for different people.  Eat frequent small meals instead of three large meals each day.  Eat your meals slowly, in a relaxed setting.  Limit fried foods.  Cook foods using methods other than frying.  Avoid drinking alcohol.  Avoid drinking large amounts of liquids with your meals.  Avoid bending over or lying down until 2-3 hours after eating. WHAT FOODS ARE NOT RECOMMENDED? The following are some foods and drinks that may worsen your symptoms: Vegetables Tomatoes. Tomato juice. Tomato and spaghetti sauce. Chili peppers. Onion and garlic. Horseradish. Fruits Oranges, grapefruit, and lemon (fruit and juice). Meats High-fat meats, fish, and poultry. This includes hot dogs, ribs, ham, sausage, salami, and bacon. Dairy Whole milk and chocolate milk. Sour cream. Cream. Butter. Ice cream. Cream cheese.  Beverages Coffee and tea, with or without caffeine. Carbonated beverages or energy drinks. Condiments Hot sauce. Barbecue sauce.  Sweets/Desserts Chocolate and cocoa. Donuts. Peppermint and spearmint. Fats and Oils High-fat foods, including [place] fries and potato chips. Other Vinegar. Strong spices, such as black  pepper, white pepper, red pepper, cayenne, curry powder, cloves, ginger, and chili powder. The items listed above may not be a complete list of foods and beverages to avoid. Contact your dietitian for more information.   This information is not intended to replace advice given to you by your health care provider. Make sure you discuss any questions you have with your health care provider.   Document Released: 06/26/2005 Document Revised: 07/17/2014 Document Reviewed: 04/30/2013 Elsevier Interactive Patient Education [employer].

## 2015-12-14 MED FILL — VALACYCLOVIR HCL 500 MG TAB: 500 | 30 days supply | Qty: 30 | Fill #11

## 2016-01-21 MED FILL — LEVOTHYROXINE 50 MCG TABLET: 50 | 90 days supply | Qty: 90 | Fill #1

## 2016-01-24 ENCOUNTER — Telehealth: Payer: Self-pay | Admitting: Osteopathic Medicine

## 2016-01-24 MED ORDER — VALACYCLOVIR HCL 500 MG PO TABS: 500.0000 mg | ORAL_TABLET | Freq: Every day | ORAL | Status: DC

## 2016-01-24 MED FILL — VALACYCLOVIR HCL 500 MG TAB: 500 | 30 days supply | Qty: 30 | Fill #0

## 2016-01-24 NOTE — Telephone Encounter (Signed)
Pt called, requested refill. Has upcoming appt with PCP scheduled. One month of Valtrex sent.

## 2016-01-25 ENCOUNTER — Telehealth: Payer: Self-pay | Admitting: *Deleted

## 2016-01-25 NOTE — Telephone Encounter (Signed)
Pt called stating she called in "refills and they aren't there." called and left message on her vm that a prescription was [facility] and that if she had other specific requests to call the pharm and have them send a request or if need be,  let us know what is  Specifically needed and keep upcoming appt with PCP

## 2016-02-02 ENCOUNTER — Encounter: Payer: Self-pay | Admitting: Osteopathic Medicine

## 2016-02-02 ENCOUNTER — Ambulatory Visit (INDEPENDENT_AMBULATORY_CARE_PROVIDER_SITE_OTHER): Payer: 59 | Admitting: Osteopathic Medicine

## 2016-02-02 VITALS — BP 129/80 | HR 66 | Ht <= 58 in | Wt 115.0 lb

## 2016-02-02 DIAGNOSIS — B001 Herpesviral vesicular dermatitis: Secondary | ICD-10-CM

## 2016-02-02 MED ORDER — VALACYCLOVIR HCL 500 MG PO TABS: 500.0000 mg | ORAL_TABLET | Freq: Every day | ORAL | 11 refills | Status: DC

## 2016-02-02 NOTE — Progress Notes (Signed)
HPI: Jillian Lee is a 40 y.o. Not Hispanic or Latino female  who presents to [facility] today, 02/02/16,  for chief complaint of:  Chief Complaint  Patient presents with  . Medication Refill    Was told needed to come in for refill - this is something that could have been handled with a message to me, will not charge patient for visit  Past medical, surgical, social and family history reviewed: Past Medical History:  Diagnosis Date  . Anemia   . GERD (gastroesophageal reflux disease)   . History of kidney stones   . HSV (herpes simplex virus) infection   . Hypercholesterolemia   . Hypertensive urgency 03/26/2015  . Hypothyroidism 03/31/2015  . Ingrown right big toenail 1998  . White coat syndrome without diagnosis of hypertension    Past Surgical History:  Procedure Laterality Date  . TOENAIL AVULSION      Current medication list and allergy/intolerance information reviewed:   Current Outpatient Prescriptions  Medication Sig Dispense Refill  . aspirin 81 MG chewable tablet Chew 1 tablet (81 mg total) by mouth daily. 90 tablet 1  . cetirizine (ZYRTEC) 10 MG tablet Take 10 mg by mouth at bedtime.     . ferrous sulfate 325 (65 FE) MG tablet Take 325 mg by mouth daily with breakfast.    . fluticasone (FLONASE) 50 MCG/ACT nasal spray Place 1 spray into both nostrils daily as needed for allergies or rhinitis.    Marland Kitchen levonorgestrel-ethinyl estradiol (AVIANE,ALESSE,LESSINA) 0.1-20 MG-MCG tablet Take 1 tablet by mouth daily. 1 Package 12  . levothyroxine (SYNTHROID, LEVOTHROID) 50 MCG tablet Take 1 tablet (50 mcg total) by mouth daily. 90 tablet 1  . lisinopril-hydrochlorothiazide (PRINZIDE,ZESTORETIC) 20-25 MG tablet Take 1 tablet by mouth daily. 30 tablet 6  . Multiple Vitamins-Minerals (MULTIVITAMIN WITH MINERALS) tablet Take 1 tablet by mouth daily.    . nitroGLYCERIN (NITROSTAT) 0.3 MG SL tablet Place 1 tablet (0.3 mg total) under the tongue  every 5 (five) minutes as needed for chest pain. 15 tablet 12  . ranitidine (ZANTAC) 300 MG tablet Take 1 tablet (300 mg total) by mouth at bedtime. 30 tablet 3  . valACYclovir (VALTREX) 500 MG tablet Take 1 tablet (500 mg total) by mouth daily. 30 tablet 0   No current facility-administered medications for this visit.    Allergies  Allergen Reactions  . Biaxin [Clarithromycin] Other (See Comments)    Doesn't feel well       Exam:  BP 129/80   Pulse 66   Ht 4' 9"  (1.448 m)   Wt 115 lb (52.2 kg)   BMI 24.89 kg/m    No results found for this or any previous visit (from the past 47 hour(s)).  No results found.   ASSESSMENT/PLAN:   Requests refill for suppressive therapy - no genital HSV outbreaks but frequent cold sores. Will reevaluate need for suppression at next annual wellness exam.   Recurrent cold sores

## 2016-02-04 MED FILL — LEVONOR-ETH ESTRAD 0.1-0.02: 0.1-20 | 84 days supply | Qty: 84 | Fill #1

## 2016-02-10 MED FILL — LISINOPRIL-HCTZ 20-25 MG TA: 20-25 | 60 days supply | Qty: 60 | Fill #3

## 2016-02-28 MED FILL — VALACYCLOVIR HCL 500 MG TAB: 500 | 30 days supply | Qty: 30 | Fill #0

## 2016-04-04 MED FILL — VALACYCLOVIR HCL 500 MG TAB: 500 | 30 days supply | Qty: 30 | Fill #1

## 2016-04-07 ENCOUNTER — Ambulatory Visit: Payer: Self-pay | Admitting: Osteopathic Medicine

## 2016-04-17 ENCOUNTER — Other Ambulatory Visit: Payer: Self-pay | Admitting: Osteopathic Medicine

## 2016-04-17 DIAGNOSIS — I10 Essential (primary) hypertension: Secondary | ICD-10-CM

## 2016-04-17 MED FILL — LISINOPRIL-HCTZ 20-25 MG TA: 20-25 | 90 days supply | Qty: 90 | Fill #0

## 2016-04-19 ENCOUNTER — Other Ambulatory Visit: Payer: Self-pay | Admitting: Osteopathic Medicine

## 2016-04-19 DIAGNOSIS — E039 Hypothyroidism, unspecified: Secondary | ICD-10-CM

## 2016-04-19 MED FILL — LEVOTHYROXINE 50 MCG TABLET: 50 | 30 days supply | Qty: 30 | Fill #0

## 2016-04-21 ENCOUNTER — Encounter: Payer: Self-pay | Admitting: Osteopathic Medicine

## 2016-04-21 ENCOUNTER — Ambulatory Visit (INDEPENDENT_AMBULATORY_CARE_PROVIDER_SITE_OTHER): Payer: 59 | Admitting: Osteopathic Medicine

## 2016-04-21 VITALS — BP 125/76 | HR 73 | Ht <= 58 in | Wt 115.0 lb

## 2016-04-21 DIAGNOSIS — N200 Calculus of kidney: Secondary | ICD-10-CM

## 2016-04-21 DIAGNOSIS — R7303 Prediabetes: Secondary | ICD-10-CM | POA: Diagnosis not present

## 2016-04-21 DIAGNOSIS — E785 Hyperlipidemia, unspecified: Secondary | ICD-10-CM | POA: Diagnosis not present

## 2016-04-21 DIAGNOSIS — Z3041 Encounter for surveillance of contraceptive pills: Secondary | ICD-10-CM

## 2016-04-21 DIAGNOSIS — E034 Atrophy of thyroid (acquired): Secondary | ICD-10-CM

## 2016-04-21 DIAGNOSIS — I10 Essential (primary) hypertension: Secondary | ICD-10-CM | POA: Diagnosis not present

## 2016-04-21 LAB — TSH: TSH: 1.72 m[IU]/L

## 2016-04-21 LAB — HEMOGLOBIN A1C
Hgb A1c MFr Bld: 5.5 % (ref <5.7)
Mean Plasma Glucose: 111 mg/dL

## 2016-04-21 MED ORDER — IBUPROFEN 800 MG PO TABS: 800.0000 mg | ORAL_TABLET | Freq: Three times a day (TID) | ORAL | 2 refills | Status: DC | PRN

## 2016-04-21 MED ORDER — LEVOTHYROXINE SODIUM 50 MCG PO TABS: 50.0000 ug | ORAL_TABLET | Freq: Every day | ORAL | 1 refills | Status: DC

## 2016-04-21 MED ORDER — LEVONORGESTREL-ETHINYL ESTRAD 0.1-20 MG-MCG PO TABS: 1.0000 | ORAL_TABLET | Freq: Every day | ORAL | 12 refills | Status: DC

## 2016-04-21 MED FILL — LEVONOR-ETH ESTRAD 0.1-0.02: 0.1-20 | 84 days supply | Qty: 84 | Fill #0

## 2016-04-21 MED FILL — IBUPROFEN 800 MG TABLET: 800 | 20 days supply | Qty: 60 | Fill #0

## 2016-04-21 NOTE — Progress Notes (Signed)
HPI: Jillian Lee is a 40 y.o. female  who presents to [facility] today, 04/21/16,  for chief complaint of:  Chief Complaint  Patient presents with  . Follow-up    THYROID   Hypothyroid: No fatigue, no palpitations, no hair or skin changes. Doing well on the medications.  Hypertension: No chest pain, pressure, shortness of breath. Well-controlled on medications  Prediabetes: Due for A1c follow-up  Contraception: Patient request refill on birth control pills.    Kidney stone: Thinks may be due for follow-up with urology. CT in 2015 showed stones and left renal pelvis 8 x 15 mm. Patient requests high-dose ibuprofen prescription, would like to avoid opiate pain medications     Past medical, surgical, social and family history reviewed: Past Medical History:  Diagnosis Date  . Anemia   . GERD (gastroesophageal reflux disease)   . History of kidney stones   . HSV (herpes simplex virus) infection   . Hypercholesterolemia   . Hypertensive urgency 03/26/2015  . Hypothyroidism 03/31/2015  . Ingrown right big toenail 1998  . White coat syndrome without diagnosis of hypertension    Past Surgical History:  Procedure Laterality Date  . TOENAIL AVULSION     Social History  Substance Use Topics  . Smoking status: Never Smoker  . Smokeless tobacco: Never Used  . Alcohol use No   Family History  Problem Relation Age of Onset  . Anxiety disorder Mother   . Hypertension Mother   . Dementia Mother   . Alcohol abuse Mother   . Depression Mother   . Stroke Mother   . Alcohol abuse Maternal Uncle   . Diabetes Maternal Uncle   . Hypertension Maternal Uncle   . Cancer Maternal Grandmother   . Diabetes Maternal Grandmother      Current medication list and allergy/intolerance information reviewed:   Current Outpatient Prescriptions  Medication Sig Dispense Refill  . aspirin 81 MG chewable tablet Chew 1 tablet (81 mg total) by mouth daily. 90  tablet 1  . cetirizine (ZYRTEC) 10 MG tablet Take 10 mg by mouth at bedtime.     . ferrous sulfate 325 (65 FE) MG tablet Take 325 mg by mouth daily with breakfast.    . fluticasone (FLONASE) 50 MCG/ACT nasal spray Place 1 spray into both nostrils daily as needed for allergies or rhinitis.    Marland Kitchen levonorgestrel-ethinyl estradiol (AVIANE,ALESSE,LESSINA) 0.1-20 MG-MCG tablet Take 1 tablet by mouth daily. 1 Package 12  . levothyroxine (SYNTHROID, LEVOTHROID) 50 MCG tablet Take 1 tablet (50 mcg total) by mouth daily. APPOINTMENT NEEDED FOR FURTHER REFILLS 30 tablet 0  . lisinopril-hydrochlorothiazide (PRINZIDE,ZESTORETIC) 20-25 MG tablet TAKE 1 TABLET BY MOUTH DAILY. 30 tablet 6  . Multiple Vitamins-Minerals (MULTIVITAMIN WITH MINERALS) tablet Take 1 tablet by mouth daily.    . nitroGLYCERIN (NITROSTAT) 0.3 MG SL tablet Place 1 tablet (0.3 mg total) under the tongue every 5 (five) minutes as needed for chest pain. 15 tablet 12  . ranitidine (ZANTAC) 300 MG tablet Take 1 tablet (300 mg total) by mouth at bedtime. 30 tablet 3  . valACYclovir (VALTREX) 500 MG tablet Take 1 tablet (500 mg total) by mouth daily. 30 tablet 11   No current facility-administered medications for this visit.    Allergies  Allergen Reactions  . Biaxin [Clarithromycin] Other (See Comments)    Doesn't feel well      Review of Systems:  Constitutional:  No  fever, no chills, No recent illness, No  unintentional weight changes. No significant fatigue.   HEENT: No  headache, no vision change  Cardiac: No  chest pain, No  pressure  Respiratory:  No  shortness of breath. No  Cough  Gastrointestinal: No  abdominal pain, No  nausea  Skin: No  Rash,   Endocrine: No cold intolerance,  No heat intolerance. No polyuria/polydipsia/polyphagia   Neurologic: No  weakness, No  dizziness   Exam:  BP 125/76   Pulse 73   Ht 4' 9"  (1.448 m)   Wt 115 lb (52.2 kg)   BMI 24.89 kg/m   Constitutional: VS see above. General  Appearance: alert, well-developed, well-nourished, NAD  Eyes: Normal lids and conjunctive, non-icteric sclera  Ears, Nose, Mouth, Throat: MMM, Normal external inspection ears/nares/mouth/lips/gums.   Neck: No masses, trachea midline. No thyroid enlargement. No tenderness/mass appreciated. No lymphadenopathy  Respiratory: Normal respiratory effort. no wheeze, no rhonchi, no rales  Cardiovascular: S1/S2 normal, no murmur, no rub/gallop auscultated. RRR. No lower extremity edema.    Skin: warm, dry, intact.   Psychiatric: Normal judgment/insight. Normal mood and affect. Oriented x3.    ASSESSMENT/PLAN:   Prediabetes - Plan: Hemoglobin A1c  Essential hypertension, benign  Hyperlipidemia, unspecified hyperlipidemia type  Hypothyroidism due to acquired atrophy of thyroid - Plan: TSH, levothyroxine (SYNTHROID, LEVOTHROID) 50 MCG tablet  Encounter for surveillance of contraceptive pills - Plan: levonorgestrel-ethinyl estradiol (AVIANE,ALESSE,LESSINA) 0.1-20 MG-MCG tablet  Kidney stone - Plan: ibuprofen (ADVIL,MOTRIN) 800 MG tablet     Visit summary with medication list and pertinent instructions was printed for patient to review. All questions at time of visit were answered - patient instructed to contact office with any additional concerns. ER/RTC precautions were reviewed with the patient. Follow-up plan: Return in about 6 months (around 10/20/2016) for [place] .   Patient Instructions    For kidney stones: please call to see when your follow-up is due... [facility] - Dr. Junious Silk Address: [address], [place], [place] [address] Phone: [phone]

## 2016-04-21 NOTE — Patient Instructions (Addendum)
   For kidney stones: please call to see when your follow-up is due... [facility] - Dr. Junious Silk Address: [address], [place], [place] [address] Phone: [phone]

## 2016-05-11 MED FILL — VALACYCLOVIR HCL 500 MG TAB: 500 | 30 days supply | Qty: 30 | Fill #2

## 2016-05-23 ENCOUNTER — Other Ambulatory Visit: Payer: Self-pay | Admitting: Osteopathic Medicine

## 2016-05-23 DIAGNOSIS — E039 Hypothyroidism, unspecified: Secondary | ICD-10-CM

## 2016-05-23 MED FILL — LEVOTHYROXINE 50 MCG TABLET: 50 | 30 days supply | Qty: 30 | Fill #0

## 2016-06-09 ENCOUNTER — Encounter: Payer: Self-pay | Admitting: Sports Medicine

## 2016-06-09 ENCOUNTER — Telehealth: Payer: Self-pay

## 2016-06-09 ENCOUNTER — Ambulatory Visit (INDEPENDENT_AMBULATORY_CARE_PROVIDER_SITE_OTHER): Payer: 59 | Admitting: Sports Medicine

## 2016-06-09 DIAGNOSIS — J209 Acute bronchitis, unspecified: Secondary | ICD-10-CM | POA: Insufficient documentation

## 2016-06-09 MED ORDER — DOXYCYCLINE HYCLATE 100 MG PO TABS: 100.0000 mg | ORAL_TABLET | Freq: Two times a day (BID) | ORAL | 0 refills | Status: AC

## 2016-06-09 MED ORDER — FLUTICASONE PROPIONATE 50 MCG/ACT NA SUSP: NASAL | 3 refills | Status: DC

## 2016-06-09 MED ORDER — AZITHROMYCIN 250 MG PO TABS: ORAL_TABLET | ORAL | 0 refills | Status: DC

## 2016-06-09 MED FILL — DOXYCYCLINE HYCLATE 100 MG: 100 | 7 days supply | Qty: 14 | Fill #0

## 2016-06-09 MED FILL — FLUTICASONE PROP 50 MCG SPR: 50 | 90 days supply | Qty: 48 | Fill #0

## 2016-06-09 NOTE — Telephone Encounter (Signed)
Christ al[facility], we talked about this in the room, this means she has changed her mind, switching to doxycycline.

## 2016-06-09 NOTE — Assessment & Plan Note (Signed)
Azithromycin, Flonase. Return as needed, she does have an intolerance but not an allergy to Biaxin. She will use an over-the-counter antitussives

## 2016-06-09 NOTE — Telephone Encounter (Signed)
[facility] pharmacy called and states patient is allergic to clarithromycin and should also be allergic to a Zpack. Patient would like to switch medications.

## 2016-06-09 NOTE — Progress Notes (Signed)
  Subjective:    CC: Coughing  HPI: For a month now this pleasant 40 year old female has had increasing cough, runny nose. No sinus pressure, no GI symptoms, no rash, shortness of breath. Cough is productive of greenish sputum but no blood.  Past medical history:  Negative.  See flowsheet/record as well for more information.  Surgical history: Negative.  See flowsheet/record as well for more information.  Family history: Negative.  See flowsheet/record as well for more information.  Social history: Negative.  See flowsheet/record as well for more information.  Allergies, and medications have been entered into the medical record, reviewed, and no changes needed.   Review of Systems: No fevers, chills, night sweats, weight loss, chest pain, or shortness of breath.   Objective:    General: Well Developed, well nourished, and in no acute distress.  Neuro: Alert and oriented x3, extra-ocular muscles intact, sensation grossly intact.  HEENT: Normocephalic, atraumatic, pupils equal round reactive to light, neck supple, no masses, no lymphadenopathy, thyroid nonpalpable. Oropharynx, nasopharynx, ear canals unremarkable. No tenderness over the frontal or maxillary sinuses Skin: Warm and dry, no rashes. Cardiac: Regular rate and rhythm, no murmurs rubs or gallops, no lower extremity edema.  Respiratory: Clear to auscultation bilaterally. Not using accessory muscles, speaking in full sentences.  Impression and Recommendations:    Acute bronchitis Azithromycin, Flonase. Return as needed, she does have an intolerance but not an allergy to Biaxin. She will use an over-the-counter antitussives  I spent 25 minutes with this patient, greater than 50% was face-to-face time counseling regarding the above diagnoses

## 2016-06-09 NOTE — Telephone Encounter (Signed)
She is not allergic to clarithromycin she has an intolerance with loose stools, and we agreed to try azithromycin, I understand they are in the same class, but this does not mean she will have loose stools.

## 2016-06-09 NOTE — Telephone Encounter (Signed)
Patient advised.

## 2016-06-09 NOTE — Telephone Encounter (Signed)
Patient states she had terrible body aches and had a hard time walking with the clarithromycin. She would like a different medication.

## 2016-06-13 MED FILL — VALACYCLOVIR HCL 500 MG TAB: 500 | 30 days supply | Qty: 30 | Fill #3

## 2016-06-22 MED FILL — LEVOTHYROXINE 50 MCG TABLET: 50 | 30 days supply | Qty: 30 | Fill #1

## 2016-07-13 MED FILL — VALACYCLOVIR HCL 500 MG TAB: 500 | 30 days supply | Qty: 30 | Fill #4

## 2016-07-13 MED FILL — LISINOPRIL-HCTZ 20-25 MG TA: 20-25 | 90 days supply | Qty: 90 | Fill #1

## 2016-07-20 MED FILL — LEVONOR-ETH ESTRAD 0.1-0.02: 0.1-20 | 84 days supply | Qty: 84 | Fill #1

## 2016-07-20 MED FILL — LEVOTHYROXINE 50 MCG TABLET: 50 | 30 days supply | Qty: 30 | Fill #2

## 2016-08-16 ENCOUNTER — Ambulatory Visit (INDEPENDENT_AMBULATORY_CARE_PROVIDER_SITE_OTHER): Payer: 59 | Admitting: Osteopathic Medicine

## 2016-08-16 ENCOUNTER — Encounter: Payer: Self-pay | Admitting: Osteopathic Medicine

## 2016-08-16 VITALS — BP 127/80 | HR 68 | Ht <= 58 in | Wt 115.0 lb

## 2016-08-16 DIAGNOSIS — R7303 Prediabetes: Secondary | ICD-10-CM

## 2016-08-16 DIAGNOSIS — I10 Essential (primary) hypertension: Secondary | ICD-10-CM

## 2016-08-16 DIAGNOSIS — E785 Hyperlipidemia, unspecified: Secondary | ICD-10-CM

## 2016-08-16 DIAGNOSIS — E034 Atrophy of thyroid (acquired): Secondary | ICD-10-CM

## 2016-08-16 DIAGNOSIS — B009 Herpesviral infection, unspecified: Secondary | ICD-10-CM | POA: Insufficient documentation

## 2016-08-16 LAB — COMPLETE METABOLIC PANEL WITH GFR
ALT: 20 U/L (ref 6–29)
AST: 20 U/L (ref 10–30)
Albumin: 4 g/dL (ref 3.6–5.1)
Alkaline Phosphatase: 45 U/L (ref 33–115)
BILIRUBIN TOTAL: 0.3 mg/dL (ref 0.2–1.2)
BUN: 17 mg/dL (ref 7–25)
CALCIUM: 8.9 mg/dL (ref 8.6–10.2)
CO2: 23 mmol/L (ref 20–31)
CREATININE: 0.96 mg/dL (ref 0.50–1.10)
Chloride: 106 mmol/L (ref 98–110)
GFR, EST AFRICAN AMERICAN: 84 mL/min (ref >=60)
GFR, Est Non African American: 73 mL/min (ref >=60)
Glucose, Bld: 84 mg/dL (ref 65–99)
Potassium: 4 mmol/L (ref 3.5–5.3)
Sodium: 139 mmol/L (ref 135–146)
Total Protein: 6.5 g/dL (ref 6.1–8.1)

## 2016-08-16 LAB — LIPID PANEL
CHOLESTEROL: 243 mg/dL — AB (ref <200)
HDL: 53 mg/dL (ref >50)
LDL CALC: 163 mg/dL — AB (ref <100)
TRIGLYCERIDES: 133 mg/dL (ref <150)
Total CHOL/HDL Ratio: 4.6 Ratio (ref <5.0)
VLDL: 27 mg/dL (ref <30)

## 2016-08-16 LAB — CBC WITH DIFFERENTIAL/PLATELET
BASOS PCT: 1 %
Basophils Absolute: 57 cells/uL (ref 0–200)
EOS ABS: 114 {cells}/uL (ref 15–500)
Eosinophils Relative: 2 %
HEMATOCRIT: 40.4 % (ref 35.0–45.0)
HEMOGLOBIN: 13.3 g/dL (ref 11.7–15.5)
LYMPHS ABS: 2052 {cells}/uL (ref 850–3900)
Lymphocytes Relative: 36 %
MCH: 31 pg (ref 27.0–33.0)
MCHC: 32.9 g/dL (ref 32.0–36.0)
MCV: 94.2 fL (ref 80.0–100.0)
MONO ABS: 513 {cells}/uL (ref 200–950)
MPV: 8.8 fL (ref 7.5–12.5)
Monocytes Relative: 9 %
NEUTROS ABS: 2964 {cells}/uL (ref 1500–7800)
Neutrophils Relative %: 52 %
Platelets: 258 10*3/uL (ref 140–400)
RBC: 4.29 MIL/uL (ref 3.80–5.10)
RDW: 13.2 % (ref 11.0–15.0)
WBC: 5.7 10*3/uL (ref 3.8–10.8)

## 2016-08-16 LAB — TSH: TSH: 1.41 mIU/L

## 2016-08-16 MED ORDER — LEVOTHYROXINE SODIUM 50 MCG PO TABS: 50.0000 ug | ORAL_TABLET | Freq: Every day | ORAL | 3 refills | Status: DC

## 2016-08-16 MED ORDER — VALACYCLOVIR HCL 500 MG PO TABS: 500.0000 mg | ORAL_TABLET | Freq: Every day | ORAL | 3 refills | Status: DC

## 2016-08-16 MED FILL — LEVOTHYROXINE 50 MCG TABLET: 50 | 90 days supply | Qty: 90 | Fill #0

## 2016-08-16 MED FILL — VALACYCLOVIR HCL 500 MG TAB: 500 | 90 days supply | Qty: 90 | Fill #0

## 2016-08-16 NOTE — Progress Notes (Signed)
HPI: Jillian Lee is a 41 y.o. female  who presents to [facility] today, 08/16/16,  for chief complaint of:  Chief Complaint  Patient presents with  . Follow-up    GET LABS DONE    Here for labs, was under the impression she was due for these, not quite due for annual labs but isn't too early in my opinion. Otherwise doing well today.   HSV - stable, requests refill of suppressive medications  PreDiabetes - stable, recent A1C have been below Pre-DM range   HTN - controlled, no home BP to report, no CP/SOB.   Hypothyroid - stable, requests refill medications    Past medical history, surgical history, social history and family history reviewed.  Patient Active Problem List   Diagnosis Date Noted  . Prediabetes 04/21/2016  . GERD (gastroesophageal reflux disease) 12/01/2015  . Hyperlipidemia 06/09/2015  . Essential hypertension, benign 03/31/2015  . Hypothyroidism 03/31/2015  . Calculus of kidney 12/20/2013    Current medication list and allergy/intolerance information reviewed.   Current Outpatient Prescriptions on File Prior to Visit  Medication Sig Dispense Refill  . aspirin 81 MG chewable tablet Chew 1 tablet (81 mg total) by mouth daily. 90 tablet 1  . cetirizine (ZYRTEC) 10 MG tablet Take 10 mg by mouth at bedtime.     . ferrous sulfate 325 (65 FE) MG tablet Take 325 mg by mouth daily with breakfast.    . fluticasone (FLONASE) 50 MCG/ACT nasal spray One spray in each nostril twice a day, use left hand for right nostril, and right hand for left nostril. 48 g 3  . ibuprofen (ADVIL,MOTRIN) 800 MG tablet Take 1 tablet (800 mg total) by mouth every 8 (eight) hours as needed. 60 tablet 2  . levonorgestrel-ethinyl estradiol (AVIANE,ALESSE,LESSINA) 0.1-20 MG-MCG tablet Take 1 tablet by mouth daily. 1 Package 12  . levothyroxine (SYNTHROID, LEVOTHROID) 50 MCG tablet Take 1 tablet (50 mcg total) by mouth daily. 90 tablet 1  .  lisinopril-hydrochlorothiazide (PRINZIDE,ZESTORETIC) 20-25 MG tablet TAKE 1 TABLET BY MOUTH DAILY. 30 tablet 6  . Multiple Vitamins-Minerals (MULTIVITAMIN WITH MINERALS) tablet Take 1 tablet by mouth daily.    . nitroGLYCERIN (NITROSTAT) 0.3 MG SL tablet Place 1 tablet (0.3 mg total) under the tongue every 5 (five) minutes as needed for chest pain. 15 tablet 12  . ranitidine (ZANTAC) 300 MG tablet Take 1 tablet (300 mg total) by mouth at bedtime. 30 tablet 3  . valACYclovir (VALTREX) 500 MG tablet Take 1 tablet (500 mg total) by mouth daily. 30 tablet 11   No current facility-administered medications on file prior to visit.    Allergies  Allergen Reactions  . Biaxin [Clarithromycin] Other (See Comments)    Doesn't feel well/ body aches      Review of Systems:  Constitutional: No recent illness  HEENT: No  headache, no vision change  Cardiac: No  chest pain, No  pressure, No palpitations  Respiratory:  No  shortness of breath. No  Cough  Skin: No  Rash   Exam:  BP 127/80   Pulse 68   Ht 4' 9"  (1.448 m)   Wt 115 lb (52.2 kg)   BMI 24.89 kg/m   Constitutional: VS see above. General Appearance: alert, well-developed, well-nourished, NAD  Eyes: Normal lids and conjunctive, non-icteric sclera  Ears, Nose, Mouth, Throat: MMM, Normal external inspection ears/nares/mouth/lips/gums.  Neck: No masses, trachea midline.   Respiratory: Normal respiratory effort. no wheeze, no rhonchi, no  rales  Cardiovascular: S1/S2 normal, no murmur, no rub/gallop auscultated. RRR.   Musculoskeletal: Gait normal. Symmetric and independent movement of all extremities  Neurological: Normal balance/coordination. No tremor.  Skin: warm, dry, intact.   Psychiatric: Normal judgment/insight. Normal mood and affect. Oriented x3.      ASSESSMENT/PLAN:   Essential hypertension, benign - Plan: CBC with Differential/Platelet, COMPLETE METABOLIC PANEL WITH GFR  Hypothyroidism due to acquired  atrophy of thyroid - Plan: TSH, levothyroxine (SYNTHROID, LEVOTHROID) 50 MCG tablet, DISCONTINUED: levothyroxine (SYNTHROID, LEVOTHROID) 50 MCG tablet  Prediabetes - Plan: Hemoglobin A1c  Hyperlipidemia, unspecified hyperlipidemia type - Plan: Lipid panel  HSV infection - Plan: valACYclovir (VALTREX) 500 MG tablet      Follow-up plan: Return in about 6 months (around 02/13/2017) for ANNUAL / PAP .  Visit summary with medication list and pertinent instructions was printed for patient to review, alert [place] if any changes needed. All questions at time of visit were answered - patient instructed to contact office with any additional concerns. ER/RTC precautions were reviewed with the patient and understanding verbalized.

## 2016-08-17 LAB — HEMOGLOBIN A1C
HEMOGLOBIN A1C: 5.4 % (ref <5.7)
MEAN PLASMA GLUCOSE: 108 mg/dL

## 2016-10-11 DIAGNOSIS — K047 Periapical abscess without sinus: Secondary | ICD-10-CM | POA: Diagnosis not present

## 2016-10-11 DIAGNOSIS — K0889 Other specified disorders of teeth and supporting structures: Secondary | ICD-10-CM | POA: Diagnosis not present

## 2016-10-11 MED FILL — HYDROCODON-APAP 5-325: 5-325 | 5 days supply | Qty: 15 | Fill #0

## 2016-10-11 MED FILL — AMOXICILLIN 875 MG TABLET: 875 | 10 days supply | Qty: 20 | Fill #0

## 2016-10-12 MED FILL — LEVONOR-ETH ESTRAD 0.1-0.02: 0.1-20 | 84 days supply | Qty: 84 | Fill #2

## 2016-10-12 MED FILL — LISINOPRIL-HCTZ 20-25 MG TA: 20-25 | 30 days supply | Qty: 30 | Fill #2

## 2016-11-03 ENCOUNTER — Emergency Department
Admission: EM | Admit: 2016-11-03 | Discharge: 2016-11-03 | Disposition: A | Discharge status: Home / Self Care | Payer: 59 | Source: Home / Self Care | Attending: Family Medicine | Admitting: Family Medicine

## 2016-11-03 ENCOUNTER — Encounter: Payer: Self-pay | Admitting: *Deleted

## 2016-11-03 DIAGNOSIS — K0889 Other specified disorders of teeth and supporting structures: Secondary | ICD-10-CM

## 2016-11-03 MED ORDER — OXYCODONE-ACETAMINOPHEN 5-325 MG PO TABS: 1.0000 | ORAL_TABLET | Freq: Four times a day (QID) | ORAL | 0 refills | Status: DC | PRN

## 2016-11-03 MED ORDER — AMOXICILLIN 875 MG PO TABS: 875.0000 mg | ORAL_TABLET | Freq: Two times a day (BID) | ORAL | 0 refills | Status: DC

## 2016-11-03 MED FILL — OXYCODONE W/APAP 5/325 TAB: 5-325 | 2 days supply | Qty: 8 | Fill #0

## 2016-11-03 MED FILL — AMOXICILLIN 875 MG TABLET: 875 | 10 days supply | Qty: 20 | Fill #0

## 2016-11-03 NOTE — Discharge Instructions (Signed)
May take Ibuprofen 28m, 4 tabs every 8 hours with food.

## 2016-11-03 NOTE — ED Triage Notes (Signed)
Patient c/o abscess tooth intermittent x 4 months. Bottom left. She has apt with dental works in 5 days but believes she needs antibiotics before her visit. Taking IBF and tylenol. She was given 1 round of antibiotics and hydrocodone at [facility] on 10/11/16

## 2016-11-03 NOTE — ED Provider Notes (Signed)
[facility]    CSN: [id number] Arrival date & time: 11/03/16  9628     History   Chief Complaint Chief Complaint  Patient presents with  . Dental Pain    HPI Jillian Lee is a 41 y.o. female.   Patient reports that a left lower molar cracked about 4 months ago, and during the past 1.5 months she has had constant dull ache in the tooth that responded to ibuprofen.  Yesterday the pain became severe, and did not respond to ibuprofen.  She reports a bad taste in her mouth.  No fevers, chills, and sweats.  No trismus. She states that she has a dental appointment in five days.   The history is provided by the patient.  Dental Pain  Location:  Lower Lower teeth location:  18/LL 2nd molar Quality:  Aching and constant Severity:  Severe Onset quality:  Sudden Duration:  1 day Timing:  Constant Progression:  Worsening Chronicity:  Chronic Context: dental fracture   Relieved by:  Nothing Worsened by:  Touching Ineffective treatments:  NSAIDs Associated symptoms: facial pain and gum swelling   Associated symptoms: no congestion, no difficulty swallowing, no drooling, no facial swelling, no fever, no headaches, no neck pain, no neck swelling, no oral bleeding, no oral lesions and no trismus   Risk factors: lack of dental care     Past Medical History:  Diagnosis Date  . Anemia   . GERD (gastroesophageal reflux disease)   . History of kidney stones   . HSV (herpes simplex virus) infection   . Hypercholesterolemia   . Hypertensive urgency 03/26/2015  . Hypothyroidism 03/31/2015  . Ingrown right big toenail 1998  . White coat syndrome without diagnosis of hypertension     Patient Active Problem List   Diagnosis Date Noted  . HSV infection 08/16/2016  . Prediabetes 04/21/2016  . GERD (gastroesophageal reflux disease) 12/01/2015  . Hyperlipidemia 06/09/2015  . Essential hypertension, benign 03/31/2015  . Hypothyroidism 03/31/2015  . Calculus of kidney  12/20/2013    Past Surgical History:  Procedure Laterality Date  . TOENAIL AVULSION      OB History    No data available       Home Medications    Prior to Admission medications   Medication Sig Start Date End Date Taking? Authorizing Provider  amoxicillin (AMOXIL) 875 MG tablet Take 1 tablet (875 mg total) by mouth 2 (two) times daily. 11/03/16   Kandra Nicolas, MD  Ascorbic Acid (VITAMIN C) 1000 MG tablet Take 1,000 mg by mouth daily.    Historical Provider, MD  aspirin 81 MG chewable tablet Chew 1 tablet (81 mg total) by mouth daily. 03/26/15   Emeterio Reeve, DO  cetirizine (ZYRTEC) 10 MG tablet Take 10 mg by mouth at bedtime.     Historical Provider, MD  ferrous sulfate 325 (65 FE) MG tablet Take 325 mg by mouth daily with breakfast.    Historical Provider, MD  fluticasone (FLONASE) 50 MCG/ACT nasal spray One spray in each nostril twice a day, use left hand for right nostril, and right hand for left nostril. 06/09/16   Silverio Decamp, MD  levonorgestrel-ethinyl estradiol (AVIANE,ALESSE,LESSINA) 0.1-20 MG-MCG tablet Take 1 tablet by mouth daily. 04/21/16   Emeterio Reeve, DO  levothyroxine (SYNTHROID, LEVOTHROID) 50 MCG tablet Take 1 tablet (50 mcg total) by mouth daily before breakfast. 08/16/16   Emeterio Reeve, DO  lisinopril-hydrochlorothiazide (PRINZIDE,ZESTORETIC) 20-25 MG tablet TAKE 1 TABLET BY MOUTH DAILY. 04/17/16  Emeterio Reeve, DO  Multiple Vitamins-Minerals (MULTIVITAMIN WITH MINERALS) tablet Take 1 tablet by mouth daily.    Historical Provider, MD  nitroGLYCERIN (NITROSTAT) 0.3 MG SL tablet Place 1 tablet (0.3 mg total) under the tongue every 5 (five) minutes as needed for chest pain. 03/26/15   Emeterio Reeve, DO  oxyCODONE-acetaminophen (ROXICET) 5-325 MG tablet Take 1 tablet by mouth every 6 (six) hours as needed for severe pain. 11/03/16   Kandra Nicolas, MD  ranitidine (ZANTAC) 300 MG tablet Take 1 tablet (300 mg total) by mouth at bedtime.  12/01/15   Emeterio Reeve, DO  valACYclovir (VALTREX) 500 MG tablet Take 1 tablet (500 mg total) by mouth daily. 08/16/16   Emeterio Reeve, DO    Family History Family History  Problem Relation Age of Onset  . Anxiety disorder Mother   . Hypertension Mother   . Dementia Mother   . Alcohol abuse Mother   . Depression Mother   . Stroke Mother   . Alcohol abuse Maternal Uncle   . Diabetes Maternal Uncle   . Hypertension Maternal Uncle   . Cancer Maternal Grandmother   . Diabetes Maternal Grandmother     Social History Social History  Substance Use Topics  . Smoking status: Never Smoker  . Smokeless tobacco: Never Used  . Alcohol use No     Allergies   Biaxin [clarithromycin]   Review of Systems Review of Systems  Constitutional: Negative for fever.  HENT: Negative for congestion, drooling, facial swelling and mouth sores.   Musculoskeletal: Negative for neck pain.  Neurological: Negative for headaches.  All other systems reviewed and are negative.    Physical Exam Triage Vital Signs ED Triage Vitals [11/03/16 0833]  Enc Vitals Group     BP 114/76     Pulse Rate 76     Resp      Temp 98.3 F (36.8 C)     Temp Source Oral     SpO2 98 %     Weight 110 lb (49.9 kg)     Height      Head Circumference      Peak Flow      Pain Score 5     Pain Loc      Pain Edu?      Excl. in [place]?    No data found.   Updated Vital Signs BP 114/76 (BP Location: Left Arm)   Pulse 76   Temp 98.3 F (36.8 C) (Oral)   Wt 110 lb (49.9 kg)   LMP 10/09/2016   SpO2 98%   BMI 23.80 kg/m   Visual Acuity Right Eye Distance:   Left Eye Distance:   Bilateral Distance:    Right Eye Near:   Left Eye Near:    Bilateral Near:     Physical Exam  Constitutional: She appears well-developed and well-nourished. No distress.  HENT:  Head: Normocephalic.  Right Ear: External ear normal.  Left Ear: External ear normal.  Nose: Nose normal.  Mouth/Throat: Uvula is midline,  oropharynx is clear and moist and mucous membranes are normal.    Tooth #18 has tenderness to tap, and gingival tenderness without swelling, erythema, or fluctuance.  No swelling of face.  Eyes: Conjunctivae are normal. Pupils are equal, round, and reactive to light.  Neck: Neck supple.  Cardiovascular: Normal rate.   Pulmonary/Chest: Effort normal.  Lymphadenopathy:    She has no cervical adenopathy.  Neurological: She is alert.  Skin: Skin is warm and dry.  Nursing note and vitals reviewed.    UC Treatments / Results  Labs (all labs ordered are listed, but only abnormal results are displayed) Labs Reviewed - No data to display  EKG  EKG Interpretation None       Radiology No results found.  Procedures Procedures (including critical care time)  Medications Ordered in UC Medications - No data to display   Initial Impression / Assessment and Plan / UC Course  I have reviewed the triage vital signs and the nursing notes.  Pertinent labs & imaging results that were available during my care of the patient were reviewed by me and considered in my medical decision making (see chart for details).    Suspect early periapical abscess. Begin amoxicillin 827m BID.  Rx for Lortab 5-325 (#8, no refill) May continue Ibuprofen 2071m 4 tabs every 8 hours with food.  Followup with dentist in five days as scheduled.  Final Clinical Impressions(s) / UC Diagnoses   Final diagnoses:  Pain, dental    New Prescriptions New Prescriptions   AMOXICILLIN (AMOXIL) 875 MG TABLET    Take 1 tablet (875 mg total) by mouth 2 (two) times daily.   OXYCODONE-ACETAMINOPHEN (ROXICET) 5-325 MG TABLET    Take 1 tablet by mouth every 6 (six) hours as needed for severe pain.     StKandra NicolasMD 11/03/16 [id number]

## 2016-11-13 ENCOUNTER — Other Ambulatory Visit: Payer: Self-pay | Admitting: Osteopathic Medicine

## 2016-11-13 DIAGNOSIS — I10 Essential (primary) hypertension: Secondary | ICD-10-CM

## 2016-11-13 MED FILL — LISINOPRIL-HCTZ 20-25 MG TA: 20-25 | 30 days supply | Qty: 30 | Fill #0

## 2016-11-13 MED FILL — LEVOTHYROXINE 50 MCG TABLET: 50 | 90 days supply | Qty: 90 | Fill #1

## 2016-11-22 MED FILL — CEPHALEXIN 500 MG CAPSULE: 500 | 10 days supply | Qty: 30 | Fill #0

## 2016-11-23 MED FILL — VALACYCLOVIR HCL 500 MG TAB: 500 | 90 days supply | Qty: 90 | Fill #1

## 2016-12-06 MED FILL — IBUPROFEN 800 MG TAB: 800 | 20 days supply | Qty: 60 | Fill #1

## 2016-12-11 ENCOUNTER — Other Ambulatory Visit: Payer: Self-pay | Admitting: Family Medicine

## 2016-12-11 DIAGNOSIS — I10 Essential (primary) hypertension: Secondary | ICD-10-CM

## 2016-12-11 MED FILL — LISINOPRIL-HCTZ 20-25 MG TA: 20-25 | 15 days supply | Qty: 15 | Fill #0

## 2016-12-19 MED FILL — AMOXICILLIN 500 MG CAPSULE: 500 | 7 days supply | Qty: 21 | Fill #0

## 2016-12-21 ENCOUNTER — Telehealth: Payer: Self-pay

## 2016-12-21 ENCOUNTER — Other Ambulatory Visit: Payer: Self-pay

## 2016-12-21 DIAGNOSIS — I10 Essential (primary) hypertension: Secondary | ICD-10-CM

## 2016-12-21 MED ORDER — LISINOPRIL-HYDROCHLOROTHIAZIDE 20-25 MG PO TABS: 1.0000 | ORAL_TABLET | Freq: Every day | ORAL | 2 refills | Status: DC

## 2016-12-21 NOTE — Telephone Encounter (Signed)
Patient stated that she got her tooth pulled.she request a Rx for Oxycodone for sleep at night. Please advise. [place]  Spoke to Dr. Sheppard Coil about this and she advised patient contact her dentist because per policy we do not prescribe pain medication without a visit. Patient has been advised [facility]

## 2016-12-25 MED FILL — LISINOPRIL-HCTZ 20-25 MG TA: 20-25 | 30 days supply | Qty: 30 | Fill #0

## 2017-01-04 MED FILL — LEVONOR-ETH ESTRAD 0.1-0.02: 0.1-20 | 84 days supply | Qty: 84 | Fill #3

## 2017-01-22 MED FILL — LISINOPRIL-HCTZ 20-25 MG TA: 20-25 | 30 days supply | Qty: 30 | Fill #1

## 2017-02-19 MED FILL — LEVOTHYROXINE 50 MCG TABLET: 50 | 90 days supply | Qty: 90 | Fill #2

## 2017-02-19 MED FILL — LISINOPRIL-HCTZ 20-25 MG TA: 20-25 | 30 days supply | Qty: 30 | Fill #2

## 2017-03-02 MED FILL — VALACYCLOVIR HCL 500 MG TAB: 500 | 90 days supply | Qty: 90 | Fill #2

## 2017-03-22 ENCOUNTER — Other Ambulatory Visit: Payer: Self-pay | Admitting: Osteopathic Medicine

## 2017-03-22 DIAGNOSIS — I10 Essential (primary) hypertension: Secondary | ICD-10-CM

## 2017-03-22 MED FILL — LISINOPRIL-HCTZ 20-25 MG TA: 20-25 | 30 days supply | Qty: 30 | Fill #0

## 2017-03-23 MED FILL — IBUPROFEN 800 MG TABS: 800 | 20 days supply | Qty: 60 | Fill #2

## 2017-03-28 MED FILL — LEVONOR-ETH ESTRAD 0.1-0.02: 0.1-20 | 28 days supply | Qty: 28 | Fill #4

## 2017-04-25 ENCOUNTER — Encounter: Payer: Self-pay | Admitting: Osteopathic Medicine

## 2017-04-25 ENCOUNTER — Ambulatory Visit (INDEPENDENT_AMBULATORY_CARE_PROVIDER_SITE_OTHER): Payer: 59 | Admitting: Osteopathic Medicine

## 2017-04-25 ENCOUNTER — Other Ambulatory Visit (HOSPITAL_COMMUNITY)
Admission: RE | Admit: 2017-04-25 | Discharge: 2017-04-25 | Disposition: A | Discharge status: Home / Self Care | Payer: 59 | Source: Ambulatory Visit | Attending: Osteopathic Medicine | Admitting: Osteopathic Medicine

## 2017-04-25 VITALS — BP 113/70 | HR 69 | Ht <= 58 in | Wt 113.0 lb

## 2017-04-25 DIAGNOSIS — I10 Essential (primary) hypertension: Secondary | ICD-10-CM | POA: Diagnosis not present

## 2017-04-25 DIAGNOSIS — Z0000 Encounter for general adult medical examination without abnormal findings: Secondary | ICD-10-CM | POA: Insufficient documentation

## 2017-04-25 DIAGNOSIS — B009 Herpesviral infection, unspecified: Secondary | ICD-10-CM

## 2017-04-25 DIAGNOSIS — E034 Atrophy of thyroid (acquired): Secondary | ICD-10-CM | POA: Diagnosis not present

## 2017-04-25 DIAGNOSIS — Z8639 Personal history of other endocrine, nutritional and metabolic disease: Secondary | ICD-10-CM

## 2017-04-25 DIAGNOSIS — K219 Gastro-esophageal reflux disease without esophagitis: Secondary | ICD-10-CM | POA: Diagnosis not present

## 2017-04-25 DIAGNOSIS — Z3041 Encounter for surveillance of contraceptive pills: Secondary | ICD-10-CM

## 2017-04-25 MED ORDER — FERROUS SULFATE 325 (65 FE) MG PO TABS: 325.0000 mg | ORAL_TABLET | Freq: Every day | ORAL | 3 refills | Status: DC

## 2017-04-25 MED ORDER — ASPIRIN 81 MG PO CHEW: 81.0000 mg | CHEWABLE_TABLET | Freq: Every day | ORAL | 3 refills | Status: AC

## 2017-04-25 MED ORDER — RANITIDINE HCL 300 MG PO TABS: 300.0000 mg | ORAL_TABLET | Freq: Every day | ORAL | 3 refills | Status: DC

## 2017-04-25 MED ORDER — LEVOTHYROXINE SODIUM 50 MCG PO TABS: 50.0000 ug | ORAL_TABLET | Freq: Every day | ORAL | 3 refills | Status: DC

## 2017-04-25 MED ORDER — LEVONORGESTREL-ETHINYL ESTRAD 0.1-20 MG-MCG PO TABS: 1.0000 | ORAL_TABLET | Freq: Every day | ORAL | 3 refills | Status: DC

## 2017-04-25 MED ORDER — VALACYCLOVIR HCL 500 MG PO TABS: 500.0000 mg | ORAL_TABLET | Freq: Every day | ORAL | 3 refills | Status: DC

## 2017-04-25 MED ORDER — LISINOPRIL-HYDROCHLOROTHIAZIDE 20-25 MG PO TABS: 1.0000 | ORAL_TABLET | Freq: Every day | ORAL | 3 refills | Status: DC

## 2017-04-25 MED FILL — raNITIdine HCL 300 MG TABS: 300 | 90 days supply | Qty: 90 | Fill #0

## 2017-04-25 MED FILL — LEVONOR-ETH ESTRAD 0.1-0.02: 0.1-20 | 84 days supply | Qty: 84 | Fill #0

## 2017-04-25 MED FILL — LISINOPRIL-HCTZ 20-25 MG TA: 20-25 | 90 days supply | Qty: 90 | Fill #0

## 2017-04-25 MED FILL — ASPIRIN ADULT LOW STRENGTH: 81 | 90 days supply | Qty: 90 | Fill #0

## 2017-04-25 NOTE — Progress Notes (Signed)
HPI: Jillian Lee is a 41 y.o. female  who presents to [facility] today, 04/25/17,  for chief complaint of:  Chief Complaint  Patient presents with  . Annual Exam    Patient here for annual physical / wellness exam.  See preventive care reviewed as below.  Recent labs reviewed in detail with the patient.   Additional concerns today include:  History of abnormal pap in the past but subsequent normals HTN: well controlled on current meds  Needs refills.    Past medical, surgical, social and family history reviewed: Patient Active Problem List   Diagnosis Date Noted  . HSV infection 08/16/2016  . Prediabetes 04/21/2016  . GERD (gastroesophageal reflux disease) 12/01/2015  . Hyperlipidemia 06/09/2015  . Essential hypertension, benign 03/31/2015  . Hypothyroidism 03/31/2015  . Calculus of kidney 12/20/2013   Past Surgical History:  Procedure Laterality Date  . TOENAIL AVULSION     Social History  Substance Use Topics  . Smoking status: Never Smoker  . Smokeless tobacco: Never Used  . Alcohol use No   Family History  Problem Relation Age of Onset  . Anxiety disorder Mother   . Hypertension Mother   . Dementia Mother   . Alcohol abuse Mother   . Depression Mother   . Stroke Mother   . Alcohol abuse Maternal Uncle   . Diabetes Maternal Uncle   . Hypertension Maternal Uncle   . Cancer Maternal Grandmother   . Diabetes Maternal Grandmother      Current medication list and allergy/intolerance information reviewed:   Current Outpatient Prescriptions  Medication Sig Dispense Refill  . Ascorbic Acid (VITAMIN C) 1000 MG tablet Take 1,000 mg by mouth daily.    Marland Kitchen aspirin 81 MG chewable tablet Chew 1 tablet (81 mg total) by mouth daily. 90 tablet 1  . cetirizine (ZYRTEC) 10 MG tablet Take 10 mg by mouth at bedtime.     . ferrous sulfate 325 (65 FE) MG tablet Take 325 mg by mouth daily with breakfast.    . fluticasone (FLONASE) 50  MCG/ACT nasal spray One spray in each nostril twice a day, use left hand for right nostril, and right hand for left nostril. 48 g 3  . levonorgestrel-ethinyl estradiol (AVIANE,ALESSE,LESSINA) 0.1-20 MG-MCG tablet Take 1 tablet by mouth daily. 1 Package 12  . levothyroxine (SYNTHROID, LEVOTHROID) 50 MCG tablet Take 1 tablet (50 mcg total) by mouth daily before breakfast. 90 tablet 3  . lisinopril-hydrochlorothiazide (PRINZIDE,ZESTORETIC) 20-25 MG tablet Take 1 tablet by mouth daily. Due for follow up visit 30 tablet 0  . Multiple Vitamins-Minerals (MULTIVITAMIN WITH MINERALS) tablet Take 1 tablet by mouth daily.    . nitroGLYCERIN (NITROSTAT) 0.3 MG SL tablet Place 1 tablet (0.3 mg total) under the tongue every 5 (five) minutes as needed for chest pain. 15 tablet 12  . ranitidine (ZANTAC) 300 MG tablet Take 1 tablet (300 mg total) by mouth at bedtime. 30 tablet 3  . valACYclovir (VALTREX) 500 MG tablet Take 1 tablet (500 mg total) by mouth daily. 90 tablet 3   No current facility-administered medications for this visit.    Allergies  Allergen Reactions  . Biaxin [Clarithromycin] Other (See Comments)    Doesn't feel well/ body aches      Review of Systems:  Constitutional:  No  fever, no chills, No recent illness, No unintentional weight changes. No significant fatigue.   HEENT: No  headache, no vision change, no hearing change, No sore throat,  No  sinus pressure  Cardiac: No  chest pain, No  pressure, No palpitations, No  Orthopnea  Respiratory:  No  shortness of breath. No  Cough  Gastrointestinal: No  abdominal pain, No  nausea, No  vomiting,  No  blood in stool, No  diarrhea, No  constipation   Musculoskeletal: No new myalgia/arthralgia  Genitourinary: No  incontinence, No  abnormal genital bleeding, No abnormal genital discharge  Skin: No  Rash, No other wounds/concerning lesions  Hem/Onc: No  easy bruising/bleeding, No  abnormal lymph node  Endocrine: No cold intolerance,   No heat intolerance. No polyuria/polydipsia/polyphagia   Neurologic: No  weakness, No  dizziness, No  slurred speech/focal weakness/facial droop  Psychiatric: No  concerns with depression, No  concerns with anxiety, No sleep problems, No mood problems  Exam:  BP 113/70   Pulse 69   Ht 4' 9"  (1.448 m)   Wt 113 lb (51.3 kg)   BMI 24.45 kg/m   Constitutional: VS see above. General Appearance: alert, well-developed, well-nourished, NAD  Eyes: Normal lids and conjunctive, non-icteric sclera  Ears, Nose, Mouth, Throat: MMM, Normal external inspection ears/nares/mouth/lips/gums.   Neck: No masses, trachea midline. No thyroid enlargement. No tenderness/mass appreciated. No lymphadenopathy  Respiratory: Normal respiratory effort. no wheeze, no rhonchi, no rales  Cardiovascular: S1/S2 normal, no murmur, no rub/gallop auscultated. RRR. No lower extremity edema.   Gastrointestinal: Nontender, no masses.   Musculoskeletal: Gait normal. No clubbing/cyanosis of digits.   Neurological: Normal balance/coordination. No tremor. No cranial nerve deficit on limited exam. Motor and sensation intact and symmetric. Cerebellar reflexes intact.   Skin: warm, dry, intact. No rash/ulcer. No concerning nevi or subq nodules on limited exam.    Psychiatric: Normal judgment/insight. Normal mood and affect. Oriented x3.  GYN: No lesions/ulcers to external genitalia, normal urethra, normal vaginal mucosa, physiologic discharge, cervix normal though friable,  without lesions, uterus not enlarged or tender, adnexa no masses and nontender  BREAST: No rashes/skin changes, normal fibrous breast tissue, no masses or tenderness, normal nipple without discharge, normal axilla     ASSESSMENT/PLAN:   Annual physical exam - Plan: COMPLETE METABOLIC PANEL WITH GFR, Lipid panel, MM DIGITAL SCREENING BILATERAL, Cytology - PAP  Encounter for surveillance of contraceptive pills - Plan: levonorgestrel-ethinyl estradiol  (AVIANE,ALESSE,LESSINA) 0.1-20 MG-MCG tablet  Essential hypertension, benign - Plan: lisinopril-hydrochlorothiazide (PRINZIDE,ZESTORETIC) 20-25 MG tablet, aspirin 81 MG chewable tablet  Gastroesophageal reflux disease, esophagitis presence not specified - Plan: ranitidine (ZANTAC) 300 MG tablet  HSV infection - Plan: valACYclovir (VALTREX) 500 MG tablet  Hypothyroidism due to acquired atrophy of thyroid - Plan: levothyroxine (SYNTHROID, LEVOTHROID) 50 MCG tablet, TSH  H/O iron deficiency - Plan: ferrous sulfate 325 (65 FE) MG tablet   FEMALE PREVENTIVE CARE Updated 04/25/17   ANNUAL SCREENING/COUNSELING  Diet/Exercise - HEALTHY HABITS DISCUSSED TO DECREASE CV RISK History  Smoking Status  . Never Smoker  Smokeless Tobacco  . Never Used   History  Alcohol Use No   Depression screen PHQ 2/9 04/25/2017  Decreased Interest 0  Down, Depressed, Hopeless 0  PHQ - 2 Score 0    Domestic violence concerns - no  HTN SCREENING - SEE [place]  Sexually active in the past year - Yes with female.  Need/want STI testing today? - no  Concerns about libido or pain with sex? - no  Plans for pregnancy? - none  INFECTIOUS DISEASE SCREENING  HIV - needs  GC/CT - does not need  HepC - DOB  [id number] - does not need  TB - does not need  DISEASE SCREENING  Lipid - needs  DM2 - does not need  Osteoporosis - women age 48+ - does not need  CANCER SCREENING  Cervical - needs  Breast - needs  Lung - does not need  Colon - does not need  ADULT VACCINATION  Influenza - annual vaccine recommended  Td - booster every 10 years   Zoster - Shingrix recommended 50+  PCV13 - was not indicated  PPSV23 - was not indicated  There is no immunization history on file for this patient.     Visit summary with medication list and pertinent instructions was printed for patient to review. All questions at time of visit were answered - patient instructed to contact  office with any additional concerns. ER/RTC precautions were reviewed with the patient. Follow-up plan: Return in about 6 months (around 10/24/2017) for RECHECK BLOOD PRESSURE .

## 2017-04-27 LAB — CYTOLOGY - PAP
Diagnosis: NEGATIVE
HPV (WINDOPATH): NOT DETECTED

## 2017-05-04 ENCOUNTER — Ambulatory Visit (INDEPENDENT_AMBULATORY_CARE_PROVIDER_SITE_OTHER): Payer: 59

## 2017-05-04 DIAGNOSIS — Z1231 Encounter for screening mammogram for malignant neoplasm of breast: Secondary | ICD-10-CM | POA: Diagnosis not present

## 2017-05-21 MED FILL — LEVOTHYROXINE 50 MCG TABLET: 50 | 90 days supply | Qty: 90 | Fill #3

## 2017-06-04 MED FILL — VALACYCLOVIR HCL 500 MG TAB: 500 | 90 days supply | Qty: 90 | Fill #3

## 2017-07-18 ENCOUNTER — Other Ambulatory Visit: Payer: Self-pay | Admitting: Osteopathic Medicine

## 2017-07-18 DIAGNOSIS — N200 Calculus of kidney: Secondary | ICD-10-CM

## 2017-07-18 MED FILL — LEVONOR-ETH ESTRAD 0.1-0.02: 0.1-20 | 84 days supply | Qty: 84 | Fill #1

## 2017-07-23 MED FILL — LISINOPRIL-HCTZ 20-25 MG TA: 20-25 | 90 days supply | Qty: 90 | Fill #1

## 2017-08-14 MED FILL — LEVOTHYROXINE 50 MCG TABLET: 50 | 90 days supply | Qty: 90 | Fill #0

## 2017-08-14 MED FILL — ASPIRIN ADULT LOW STRENGTH: 81 | 90 days supply | Qty: 90 | Fill #1

## 2017-09-06 MED FILL — VALACYCLOVIR HCL 500 MG TAB: 500 | 90 days supply | Qty: 90 | Fill #0

## 2017-10-08 MED FILL — LEVONOR-ETH ESTRAD 0.1-0.02: 0.1-20 | 84 days supply | Qty: 84 | Fill #2

## 2017-10-18 MED FILL — LISINOPRIL-HCTZ 20-25 MG TA: 20-25 | 90 days supply | Qty: 90 | Fill #2

## 2017-10-24 ENCOUNTER — Ambulatory Visit: Payer: Self-pay | Admitting: Osteopathic Medicine

## 2017-11-02 ENCOUNTER — Encounter: Payer: Self-pay | Admitting: Osteopathic Medicine

## 2017-11-02 ENCOUNTER — Ambulatory Visit (INDEPENDENT_AMBULATORY_CARE_PROVIDER_SITE_OTHER): Payer: 59 | Admitting: Osteopathic Medicine

## 2017-11-02 VITALS — BP 129/66 | HR 78 | Wt 126.0 lb

## 2017-11-02 DIAGNOSIS — R635 Abnormal weight gain: Secondary | ICD-10-CM | POA: Diagnosis not present

## 2017-11-02 DIAGNOSIS — I10 Essential (primary) hypertension: Secondary | ICD-10-CM

## 2017-11-02 DIAGNOSIS — B9789 Other viral agents as the cause of diseases classified elsewhere: Secondary | ICD-10-CM | POA: Diagnosis not present

## 2017-11-02 DIAGNOSIS — J069 Acute upper respiratory infection, unspecified: Secondary | ICD-10-CM | POA: Diagnosis not present

## 2017-11-02 MED ORDER — IPRATROPIUM BROMIDE 0.06 % NA SOLN: 2.0000 | Freq: Four times a day (QID) | NASAL | 1 refills | Status: DC

## 2017-11-02 MED ORDER — BENZONATATE 200 MG PO CAPS: 200.0000 mg | ORAL_CAPSULE | Freq: Three times a day (TID) | ORAL | 0 refills | Status: DC | PRN

## 2017-11-02 MED FILL — IPRATROPIUM 0.06% SPRAY: 0.06 | 10 days supply | Qty: 15 | Fill #0

## 2017-11-02 MED FILL — BENZONATATE 200 MG CAPSULE: 200 | 10 days supply | Qty: 30 | Fill #0

## 2017-11-02 NOTE — Progress Notes (Signed)
HPI: Jillian Lee is a 42 y.o. female who  has a past medical history of Anemia, GERD (gastroesophageal reflux disease), History of kidney stones, HSV (herpes simplex virus) infection, Hypercholesterolemia, Hypertensive urgency (03/26/2015), Hypothyroidism (03/31/2015), Ingrown right big toenail (1998), and White coat syndrome without diagnosis of hypertension.  she presents to [facility] today, 11/02/17,  for chief complaint of:  Follow-up blood pressure  Viral illness Weight gain  Blood pressure stable on current medications, no chest pain, pressure, shortness of breath.  No headache or vision change.  Due for labs.  Sick, 5 days, scratchy throat, stuffy nose, drainage in throat, chest congestion. OTC Rx and home remedies not too helpful.   Is been exercising more and attempt to lose weight, has not been very successful.  Admits that diet could be better, would like some advice on this issue.     Past medical history, surgical history, and family history reviewed.  Current medication list and allergy/intolerance information reviewed.   (See remainder of HPI, as well as ROS, PE below)    ASSESSMENT/PLAN:   Essential hypertension, benign - Plan: CBC, Lipid panel, TSH, COMPLETE METABOLIC PANEL WITH GFR  Viral URI with cough - List of OTC medications provided as well as Atrovent, Tessalon  Weight gain - Gust lifestyle modifications, logging calories/calorie restriction, increase intensity/duration/frequency of exercise   No orders of the defined types were placed in this encounter.   Patient Instructions  Over-the-Counter Medications & Home Remedies for Upper Respiratory Illness  Note: the following list assumes no pregnancy, normal liver & kidney function and no other drug interactions. Dr. Sheppard Coil has highlighted medications which are safe for you to use, but these may not be appropriate for everyone. Always ask a pharmacist or qualified  medical provider if you have any questions!   Aches/Pains, Fever, Headache Acetaminophen (Tylenol) 500 mg tablets - take max 2 tablets (1000 mg) every 6 hours (4 times per day)  Ibuprofen (Motrin) 200 mg tablets - take max 4 tablets (800 mg) every 6 hours*  Sinus Congestion Prescription Atrovent as directed Nasal Saline if desired Oxymetolazone (Afrin, others) sparing use due to rebound congestion, NEVER use in kids Phenylephrine (Sudafed) 10 mg tablets every 4 hours (or the 12-hour formulation)* Diphenhydramine (Benadryl) 25 mg tablets - take max 2 tablets every 4 hours  Cough & Sore Throat Prescription cough pills or syrups as directed Dextromethorphan (Robitussin, others) - cough suppressant Guaifenesin (Robitussin, Mucinex, others) - expectorant (helps cough up mucus) (Dextromethorphan and Guaifenesin also come in a combination tablet) Lozenges w/ Benzocaine + Menthol (Cepacol) Honey - as much as you want! Teas which "coat the throat" - look for ingredients Elm Bark, Licorice Root, Marshmallow Root  Other Zinc Lozenges within 24 hours of symptoms onset - mixed evidence this shortens the duration of the common cold Don't waste your money on Vitamin C or Echinacea  *Caution in patients with high blood pressure    Weight loss: important things to remember  It is hard work! You will have setbacks, but don't get discouraged. The goal is not short-term success, it is long-term health.   Looking at the numbers is important to track your progress and set goals, but how you are feeling and your overall health are the most important things! BMI and pounds and calories and miles logged aren't everything - they are tools to help [place] reach your goals.  You can do this!!!   Things to remember for exercise for weight  loss:   Please note - I am not a certified Physiological scientist. I can present you with ideas and general workout goals, but an exercise program is largely up to you. Find  something you can stick with, and something you enjoy!   As you progress in your exercise regimen think about gradually increasing the following, week by week:   intensity (how strenuous is your workout)  frequency (how often you are exercising)  duration (how many minutes at a time you are exercising)  Walking for 20 minutes a day is certainly better than nothing, but more strenuous exercise will develop better cardiovascular fitness.   interval training (high-intensity alternating with low-intensity, think walk/jog rather than just walk)  muscle strengthening exercises (weight lifting, calisthenics, yoga) - this also helps prevent osteoporosis!   Things to remember for diet changes for weight loss:   Please note - I am not a certified dietician. I can present you with ideas and general diet goals, but a meal plan is largely up to you. I am happy to refer you to a dietician who can give you a detailed meal plan.  Apps/logs are crucial to track how you're eating! It's not realistic to be logging everything you eat forever, but when you're starting a healthy eating lifestyle it's very helpful, and checking in with logs now and then helps you stick to your program!   Calorie restriction with the goal weight loss of no more than one to one and a half pounds per week.   Increase lean protein such as chicken, fish, [place].   Decrease fatty foods such as dairy, butter.   Decrease sugary foods. Avoid sugary drinks such as soda or juice.  Increase fiber found in fruit and vegetables.          Follow-up plan: Return in about 6 months (around 05/04/2018) for [facility] or sooner if needed.       ^^^^^^^^^^^^^^^^^^^^^^^^^^^^^^ ^^^^^^^^^^^^^^^^^^^^^^^^^^^^^^   Outpatient Encounter Medications as of 11/02/2017  Medication Sig  . Ascorbic Acid (VITAMIN C) 1000 MG tablet Take 1,000 mg by mouth daily.  Marland Kitchen aspirin 81 MG chewable tablet Chew 1 tablet (81 mg total) by mouth  daily.  . cetirizine (ZYRTEC) 10 MG tablet Take 10 mg by mouth at bedtime.   . ferrous sulfate 325 (65 FE) MG tablet Take 1 tablet (325 mg total) by mouth daily with breakfast.  . fluticasone (FLONASE) 50 MCG/ACT nasal spray One spray in each nostril twice a day, use left hand for right nostril, and right hand for left nostril.  Marland Kitchen levonorgestrel-ethinyl estradiol (AVIANE,ALESSE,LESSINA) 0.1-20 MG-MCG tablet Take 1 tablet by mouth daily.  Marland Kitchen levothyroxine (SYNTHROID, LEVOTHROID) 50 MCG tablet Take 1 tablet (50 mcg total) by mouth daily before breakfast.  . lisinopril-hydrochlorothiazide (PRINZIDE,ZESTORETIC) 20-25 MG tablet Take 1 tablet by mouth daily.  . Multiple Vitamins-Minerals (MULTIVITAMIN WITH MINERALS) tablet Take 1 tablet by mouth daily.  . nitroGLYCERIN (NITROSTAT) 0.3 MG SL tablet Place 1 tablet (0.3 mg total) under the tongue every 5 (five) minutes as needed for chest pain.  . ranitidine (ZANTAC) 300 MG tablet Take 1 tablet (300 mg total) by mouth at bedtime.  . valACYclovir (VALTREX) 500 MG tablet Take 1 tablet (500 mg total) by mouth daily.   No facility-administered encounter medications on file as of 11/02/2017.    Allergies  Allergen Reactions  . Biaxin [Clarithromycin] Other (See Comments)    Doesn't feel well/ body aches      Review of Systems:  Constitutional: +recent illness  HEENT: No  headache, no vision change, +sinus pressure  Cardiac: No  chest pain, No  pressure, No palpitations  Respiratory:  No  shortness of breath. +Cough  Gastrointestinal: No  abdominal pain, no change on bowel habits  Musculoskeletal: No new myalgia/arthralgia  Skin: No  Rash  Hem/Onc: No  easy bruising/bleeding, No  abnormal lumps/bumps  Neurologic: No  weakness, No  Dizziness  Psychiatric: No  concerns with depression, No  concerns with anxiety  Exam:  BP 129/66   Pulse 78   Wt 126 lb (57.2 kg)   SpO2 100%   BMI 27.27 kg/m   Constitutional: VS see above. General  Appearance: alert, well-developed, well-nourished, NAD  Eyes: Normal lids and conjunctive, non-icteric sclera  Ears, Nose, Mouth, Throat: MMM, Normal external inspection ears/nares/mouth/lips/gums.  TMs normal bilaterally,.  Normal nasal mucosa.  Mild pharyngeal erythema  Neck: No masses, trachea midline.  No lymphadenopathy  Respiratory: Normal respiratory effort. no wheeze, no rhonchi, no rales  Cardiovascular: S1/S2 normal, no murmur, no rub/gallop auscultated. RRR.   Musculoskeletal: Gait normal. Symmetric and independent movement of all extremities  Neurological: Normal balance/coordination. No tremor.  Skin: warm, dry, intact.   Psychiatric: Normal judgment/insight. Normal mood and affect. Oriented x3.   Visit summary with medication list and pertinent instructions was printed for patient to review, alert [place] if any changes needed. All questions at time of visit were answered - patient instructed to contact office with any additional concerns. ER/RTC precautions were reviewed with the patient and understanding verbalized.   Follow-up plan: Return in about 6 months (around 05/04/2018) for [facility] or sooner if needed.    Please note: voice recognition software was used to produce this document, and typos may escape review. Please contact Dr. Sheppard Coil for any needed clarifications.

## 2017-11-02 NOTE — Patient Instructions (Addendum)
Over-the-Counter Medications & Home Remedies for Upper Respiratory Illness  Note: the following list assumes no pregnancy, normal liver & kidney function and no other drug interactions. Dr. Sheppard Coil has highlighted medications which are safe for you to use, but these may not be appropriate for everyone. Always ask a pharmacist or qualified medical provider if you have any questions!   Aches/Pains, Fever, Headache Acetaminophen (Tylenol) 500 mg tablets - take max 2 tablets (1000 mg) every 6 hours (4 times per day)  Ibuprofen (Motrin) 200 mg tablets - take max 4 tablets (800 mg) every 6 hours*  Sinus Congestion Prescription Atrovent as directed Nasal Saline if desired Oxymetolazone (Afrin, others) sparing use due to rebound congestion, NEVER use in kids Phenylephrine (Sudafed) 10 mg tablets every 4 hours (or the 12-hour formulation)* Diphenhydramine (Benadryl) 25 mg tablets - take max 2 tablets every 4 hours  Cough & Sore Throat Prescription cough pills or syrups as directed Dextromethorphan (Robitussin, others) - cough suppressant Guaifenesin (Robitussin, Mucinex, others) - expectorant (helps cough up mucus) (Dextromethorphan and Guaifenesin also come in a combination tablet) Lozenges w/ Benzocaine + Menthol (Cepacol) Honey - as much as you want! Teas which "coat the throat" - look for ingredients Elm Bark, Licorice Root, Marshmallow Root  Other Zinc Lozenges within 24 hours of symptoms onset - mixed evidence this shortens the duration of the common cold Don't waste your money on Vitamin C or Echinacea  *Caution in patients with high blood pressure    Weight loss: important things to remember  It is hard work! You will have setbacks, but don't get discouraged. The goal is not short-term success, it is long-term health.   Looking at the numbers is important to track your progress and set goals, but how you are feeling and your overall health are the most important things! BMI and  pounds and calories and miles logged aren't everything - they are tools to help [place] reach your goals.  You can do this!!!   Things to remember for exercise for weight loss:   Please note - I am not a certified personal trainer. I can present you with ideas and general workout goals, but an exercise program is largely up to you. Find something you can stick with, and something you enjoy!   As you progress in your exercise regimen think about gradually increasing the following, week by week:   intensity (how strenuous is your workout)  frequency (how often you are exercising)  duration (how many minutes at a time you are exercising)  Walking for 20 minutes a day is certainly better than nothing, but more strenuous exercise will develop better cardiovascular fitness.   interval training (high-intensity alternating with low-intensity, think walk/jog rather than just walk)  muscle strengthening exercises (weight lifting, calisthenics, yoga) - this also helps prevent osteoporosis!   Things to remember for diet changes for weight loss:   Please note - I am not a certified dietician. I can present you with ideas and general diet goals, but a meal plan is largely up to you. I am happy to refer you to a dietician who can give you a detailed meal plan.  Apps/logs are crucial to track how you're eating! It's not realistic to be logging everything you eat forever, but when you're starting a healthy eating lifestyle it's very helpful, and checking in with logs now and then helps you stick to your program!   Calorie restriction with the goal weight loss of no more than one to  one and a half pounds per week.   Increase lean protein such as chicken, fish, [place].   Decrease fatty foods such as dairy, butter.   Decrease sugary foods. Avoid sugary drinks such as soda or juice.  Increase fiber found in fruit and vegetables.

## 2017-11-13 ENCOUNTER — Telehealth: Payer: Self-pay

## 2017-11-13 NOTE — Telephone Encounter (Signed)
Pt left a vm msg stating she has gotten worse and still not feeling better. Last seen on 11/02/17 for viral URI. Tried calling pt, no answer. Left a detailed msg for pt to make an appt to be re-evaluated. Call back information provided.

## 2017-11-14 MED FILL — ASPIRIN ADULT LOW STRENGTH: 81 | 90 days supply | Qty: 90 | Fill #2

## 2017-11-14 MED FILL — LEVOTHYROXINE 50 MCG TABLET: 50 | 90 days supply | Qty: 90 | Fill #1

## 2017-11-15 NOTE — Telephone Encounter (Signed)
Pt has appt for tomorrow to get evaluation.

## 2017-11-16 ENCOUNTER — Ambulatory Visit (INDEPENDENT_AMBULATORY_CARE_PROVIDER_SITE_OTHER): Payer: 59 | Admitting: Family Medicine

## 2017-11-16 ENCOUNTER — Encounter: Payer: Self-pay | Admitting: Family Medicine

## 2017-11-16 VITALS — BP 117/74 | HR 82 | Ht <= 58 in | Wt 127.0 lb

## 2017-11-16 DIAGNOSIS — R059 Cough, unspecified: Secondary | ICD-10-CM

## 2017-11-16 DIAGNOSIS — R05 Cough: Secondary | ICD-10-CM

## 2017-11-16 MED ORDER — PANTOPRAZOLE SODIUM 40 MG PO TBEC: 40.0000 mg | DELAYED_RELEASE_TABLET | Freq: Every day | ORAL | 3 refills | Status: DC

## 2017-11-16 MED ORDER — GUAIFENESIN-CODEINE 100-10 MG/5ML PO SOLN: 5.0000 mL | Freq: Four times a day (QID) | ORAL | 0 refills | Status: DC | PRN

## 2017-11-16 MED ORDER — CEFDINIR 300 MG PO CAPS: 300.0000 mg | ORAL_CAPSULE | Freq: Two times a day (BID) | ORAL | 0 refills | Status: DC

## 2017-11-16 MED ORDER — PREDNISONE 10 MG PO TABS: 30.0000 mg | ORAL_TABLET | Freq: Every day | ORAL | 0 refills | Status: DC

## 2017-11-16 MED FILL — PANTOPRAZOLE SOD DR 40 MG T: 40 | 90 days supply | Qty: 90 | Fill #0

## 2017-11-16 MED FILL — CHERATUSSIN AC SYRUP: 100-10 | 6 days supply | Qty: 120 | Fill #0

## 2017-11-16 MED FILL — predniSONE 10 MG TABS: 10 | 5 days supply | Qty: 15 | Fill #0

## 2017-11-16 NOTE — Progress Notes (Signed)
Jillian Lee is a 42 y.o. female who presents to [place] [place] today for cough.  Patient notes a cough ongoing now for a few weeks.  She notes that she initially had viral URI symptoms with fevers and chills.  Those have resolved however the cough has continued.  She notes the cough is occasionally productive.  She is tried Mucinex which did not help very much at all.  Cough interferes with sleep.  She notes that she takes Zantac occasionally for acid reflux but not regularly.  She denies severe acid reflux symptoms during this episode.  She denies a history of asthma or smoking.  She is been using [employer] and Atrovent nasal spray which have not been helpful much.   Past Medical History:  Diagnosis Date  . Anemia   . GERD (gastroesophageal reflux disease)   . History of kidney stones   . HSV (herpes simplex virus) infection   . Hypercholesterolemia   . Hypertensive urgency 03/26/2015  . Hypothyroidism 03/31/2015  . Ingrown right big toenail 1998   Past Surgical History:  Procedure Laterality Date  . TOENAIL AVULSION     Social History   Tobacco Use  . Smoking status: Never Smoker  . Smokeless tobacco: Never Used  Substance Use Topics  . Alcohol use: No   family history includes Alcohol abuse in her maternal uncle and mother; Anxiety disorder in her mother; Cancer in her maternal grandmother; Dementia in her mother; Depression in her mother; Diabetes in her maternal grandmother and maternal uncle; Hypertension in her maternal uncle and mother; Stroke in her mother.  ROS as above:  Medications: Current Outpatient Medications  Medication Sig Dispense Refill  . Ascorbic Acid (VITAMIN C) 1000 MG tablet Take 1,000 mg by mouth daily.    Marland Kitchen aspirin 81 MG chewable tablet Chew 1 tablet (81 mg total) by mouth daily. 90 tablet 3  . cetirizine (ZYRTEC) 10 MG tablet  Take 10 mg by mouth at bedtime.     . ferrous sulfate 325 (65 FE) MG tablet Take 1 tablet (325 mg total) by mouth daily with breakfast. 90 tablet 3  . fluticasone (FLONASE) 50 MCG/ACT nasal spray One spray in each nostril twice a day, use left hand for right nostril, and right hand for left nostril. 48 g 3  . ipratropium (ATROVENT) 0.06 % nasal spray Place 2 sprays into both nostrils 4 (four) times daily. 15 mL 1  . levonorgestrel-ethinyl estradiol (AVIANE,ALESSE,LESSINA) 0.1-20 MG-MCG tablet Take 1 tablet by mouth daily. 3 Package 3  . levothyroxine (SYNTHROID, LEVOTHROID) 50 MCG tablet Take 1 tablet (50 mcg total) by mouth daily before breakfast. 90 tablet 3  . lisinopril-hydrochlorothiazide (PRINZIDE,ZESTORETIC) 20-25 MG tablet Take 1 tablet by mouth daily. 90 tablet 3  . Multiple Vitamins-Minerals (MULTIVITAMIN WITH MINERALS) tablet Take 1 tablet by mouth daily.    . nitroGLYCERIN (NITROSTAT) 0.3 MG SL tablet Place 1 tablet (0.3 mg total) under the tongue every 5 (five) minutes as needed for chest pain. 15 tablet 12  . ranitidine (ZANTAC) 300 MG tablet Take 1 tablet (300 mg total) by mouth at bedtime. 90 tablet 3  . valACYclovir (VALTREX) 500 MG tablet Take 1 tablet (500 mg total) by mouth daily. 90 tablet 3  . cefdinir (OMNICEF) 300 MG capsule Take 1 capsule (300 mg total) by mouth 2 (two) times daily. 14 capsule 0  . guaiFENesin-codeine 100-10 MG/5ML syrup Take 5 mLs by mouth  every 6 (six) hours as needed for cough. 120 mL 0  . pantoprazole (PROTONIX) 40 MG tablet Take 1 tablet (40 mg total) by mouth daily. 30 tablet 3  . predniSONE (DELTASONE) 10 MG tablet Take 3 tablets (30 mg total) by mouth daily with breakfast. 15 tablet 0   No current facility-administered medications for this visit.    Allergies  Allergen Reactions  . Biaxin [Clarithromycin] Other (See Comments)    Doesn't feel well/ body aches    Health Maintenance Health Maintenance  Topic Date Due  . HIV Screening   02/11/1989  . TETANUS/TDAP  02/11/1993  . INFLUENZA VACCINE  02/07/2018  . PAP SMEAR  04/25/2020     Exam:  BP 117/74   Pulse 82   Ht 4' 9"  (1.448 m)   Wt 127 lb (57.6 kg)   BMI 27.48 kg/m  Gen: Well NAD HEENT: EOMI,  MMM posterior pharynx with cobblestoning.  Mild nasal turbinate erythema and swelling bilaterally.  No cervical lymphadenopathy.  Normal tympanic membranes bilaterally. Lungs: Normal work of breathing. CTABL Heart: RRR no MRG Abd: NABS, Soft. Nondistended, Nontender Exts: Brisk capillary refill, warm and well perfused.      Assessment and Plan: 42 y.o. female with cough.  Likely post viral versus bronchitic.  Plan to treat with prednisone, and codeine cough syrup.  We will additionally prescribe pantoprazole.  Patient's cough may be due to silent acid reflux.  If not better back-up antibiotic prescription printed.  Patient will fill if worsening or if not better.  Recheck in the near future if not improving or worsening.   No orders of the defined types were placed in this encounter.  Meds ordered this encounter  Medications  . predniSONE (DELTASONE) 10 MG tablet    Sig: Take 3 tablets (30 mg total) by mouth daily with breakfast.    Dispense:  15 tablet    Refill:  0  . guaiFENesin-codeine 100-10 MG/5ML syrup    Sig: Take 5 mLs by mouth every 6 (six) hours as needed for cough.    Dispense:  120 mL    Refill:  0  . cefdinir (OMNICEF) 300 MG capsule    Sig: Take 1 capsule (300 mg total) by mouth 2 (two) times daily.    Dispense:  14 capsule    Refill:  0  . pantoprazole (PROTONIX) 40 MG tablet    Sig: Take 1 tablet (40 mg total) by mouth daily.    Dispense:  30 tablet    Refill:  3     Discussed warning signs or symptoms. Please see discharge instructions. Patient expresses understanding.

## 2017-11-16 NOTE — Patient Instructions (Signed)
Thank you for coming in today. Continue existing cough medications during the day.  Use the codeine cough medicine at bedtime.  Do not take it at work or driving.  Take the prendisone now for 5 days.  If not better or if worse fill and take the omnicef antibiotics.  Recheck if not better.  Call or go to the emergency room if you get worse, have trouble breathing, have chest pains, or palpitations.   Take protonix daily for a few weeks for possible acid reflux making cough worse.    Cough, Adult A cough helps to clear your throat and lungs. A cough may last only 2-3 weeks (acute), or it may last longer than 8 weeks (chronic). Many different things can cause a cough. A cough may be a sign of an illness or another medical condition. Follow these instructions at home:  Pay attention to any changes in your cough.  Take medicines only as told by your doctor. ? If you were prescribed an antibiotic medicine, take it as told by your doctor. Do not stop taking it even if you start to feel better. ? Talk with your doctor before you try using a cough medicine.  Drink enough fluid to keep your pee (urine) clear or pale yellow.  If the air is dry, use a cold steam vaporizer or humidifier in your home.  Stay away from things that make you cough at work or at home.  If your cough is worse at night, try using extra pillows to raise your head up higher while you sleep.  Do not smoke, and try not to be around smoke. If you need help quitting, ask your doctor.  Do not have caffeine.  Do not drink alcohol.  Rest as needed. Contact a doctor if:  You have new problems (symptoms).  You cough up yellow fluid (pus).  Your cough does not get better after 2-3 weeks, or your cough gets worse.  Medicine does not help your cough and you are not sleeping well.  You have pain that gets worse or pain that is not helped with medicine.  You have a fever.  You are losing weight and you do not know  why.  You have night sweats. Get help right away if:  You cough up blood.  You have trouble breathing.  Your heartbeat is very fast. This information is not intended to replace advice given to you by your health care provider. Make sure you discuss any questions you have with your health care provider. Document Released: 03/09/2011 Document Revised: 12/02/2015 Document Reviewed: 09/02/2014 Elsevier Interactive Patient Education  [employer].

## 2017-12-25 MED FILL — VALACYCLOVIR HCL 500 MG TAB: 500 | 90 days supply | Qty: 90 | Fill #1

## 2018-01-04 MED FILL — LEVONOR-ETH ESTRAD 0.1-0.02: 0.1-20 | 84 days supply | Qty: 84 | Fill #3

## 2018-01-17 MED FILL — LISINOPRIL-HCTZ 20-25 MG TA: 20-25 | 90 days supply | Qty: 90 | Fill #3

## 2018-02-11 MED FILL — LEVOTHYROXINE 50 MCG TABLET: 50 | 90 days supply | Qty: 90 | Fill #2

## 2018-02-11 MED FILL — ASPIRIN ADULT LOW STRENGTH: 81 | 90 days supply | Qty: 90 | Fill #3

## 2018-03-28 ENCOUNTER — Other Ambulatory Visit: Payer: Self-pay | Admitting: Osteopathic Medicine

## 2018-03-28 DIAGNOSIS — Z3041 Encounter for surveillance of contraceptive pills: Secondary | ICD-10-CM

## 2018-03-28 MED FILL — LEVONOR-ETH ESTRAD 0.1-0.02: 0.1-20 | 84 days supply | Qty: 84 | Fill #0

## 2018-04-03 MED FILL — VALACYCLOVIR HCL 500 MG TAB: 500 | 90 days supply | Qty: 90 | Fill #2

## 2018-04-17 ENCOUNTER — Other Ambulatory Visit: Payer: Self-pay | Admitting: Osteopathic Medicine

## 2018-04-17 DIAGNOSIS — I10 Essential (primary) hypertension: Secondary | ICD-10-CM

## 2018-04-18 MED FILL — LISINOPRIL-HCTZ 20-25 MG TA: 20-25 | 90 days supply | Qty: 90 | Fill #0

## 2018-05-06 ENCOUNTER — Ambulatory Visit: Payer: Self-pay | Admitting: Osteopathic Medicine

## 2018-05-08 ENCOUNTER — Ambulatory Visit: Payer: Self-pay | Admitting: Osteopathic Medicine

## 2018-05-13 ENCOUNTER — Other Ambulatory Visit: Payer: Self-pay | Admitting: Osteopathic Medicine

## 2018-05-13 DIAGNOSIS — E034 Atrophy of thyroid (acquired): Secondary | ICD-10-CM

## 2018-05-13 MED FILL — LEVOTHYROXINE 50 MCG TABLET: 50 | 30 days supply | Qty: 30 | Fill #0

## 2018-06-11 ENCOUNTER — Other Ambulatory Visit: Payer: Self-pay | Admitting: Osteopathic Medicine

## 2018-06-11 DIAGNOSIS — E034 Atrophy of thyroid (acquired): Secondary | ICD-10-CM

## 2018-06-11 MED FILL — LEVOTHYROXINE 50 MCG TABLET: 50 | 15 days supply | Qty: 15 | Fill #0

## 2018-06-17 MED FILL — LEVONOR-ETH ESTRAD 0.1-0.02: 0.1-20 | 84 days supply | Qty: 84 | Fill #1

## 2018-06-28 ENCOUNTER — Encounter: Payer: Self-pay | Admitting: Osteopathic Medicine

## 2018-06-28 ENCOUNTER — Ambulatory Visit (INDEPENDENT_AMBULATORY_CARE_PROVIDER_SITE_OTHER): Payer: 59 | Admitting: Osteopathic Medicine

## 2018-06-28 VITALS — BP 124/71 | HR 73 | Temp 97.7°F | Wt 124.0 lb

## 2018-06-28 DIAGNOSIS — E034 Atrophy of thyroid (acquired): Secondary | ICD-10-CM

## 2018-06-28 DIAGNOSIS — B9789 Other viral agents as the cause of diseases classified elsewhere: Secondary | ICD-10-CM | POA: Diagnosis not present

## 2018-06-28 DIAGNOSIS — I10 Essential (primary) hypertension: Secondary | ICD-10-CM

## 2018-06-28 DIAGNOSIS — J069 Acute upper respiratory infection, unspecified: Secondary | ICD-10-CM | POA: Diagnosis not present

## 2018-06-28 MED ORDER — PREDNISONE 20 MG PO TABS: 20.0000 mg | ORAL_TABLET | Freq: Two times a day (BID) | ORAL | 0 refills | Status: DC

## 2018-06-28 MED ORDER — IPRATROPIUM BROMIDE 0.06 % NA SOLN: 2.0000 | Freq: Four times a day (QID) | NASAL | 1 refills | Status: DC

## 2018-06-28 MED ORDER — LEVOTHYROXINE SODIUM 50 MCG PO TABS: 50.0000 ug | ORAL_TABLET | Freq: Every day | ORAL | 0 refills | Status: DC

## 2018-06-28 MED ORDER — AMOXICILLIN-POT CLAVULANATE 875-125 MG PO TABS: 1.0000 | ORAL_TABLET | Freq: Two times a day (BID) | ORAL | 0 refills | Status: DC

## 2018-06-28 MED ORDER — LISINOPRIL-HYDROCHLOROTHIAZIDE 20-25 MG PO TABS: 1.0000 | ORAL_TABLET | Freq: Every day | ORAL | 3 refills | Status: DC

## 2018-06-28 MED ORDER — GUAIFENESIN-CODEINE 100-10 MG/5ML PO SOLN: 5.0000 mL | Freq: Four times a day (QID) | ORAL | 0 refills | Status: DC | PRN

## 2018-06-28 MED FILL — GUAIATUSSIN AC LIQUID: 100-10 | 3 days supply | Qty: 120 | Fill #0

## 2018-06-28 MED FILL — LEVOTHYROXINE 50 MCG TABLET: 50 | 30 days supply | Qty: 30 | Fill #0

## 2018-06-28 MED FILL — IPRATROPIUM 0.06% SPRAY: 0.06 | 15 days supply | Qty: 15 | Fill #0

## 2018-06-28 MED FILL — predniSONE 20 MG TABS: 20 | 5 days supply | Qty: 10 | Fill #0

## 2018-06-28 NOTE — Patient Instructions (Signed)
Medications & Home Remedies for Upper Respiratory Illness   Note: the following list assumes no pregnancy, normal liver & kidney function and no other drug interactions. Dr. Sheppard Coil has highlighted medications which are safe for you to use, but these may not be appropriate for everyone. Always ask a pharmacist or qualified medical provider if you have any questions!    Aches/Pains, Fever, Headache OTC Acetaminophen (Tylenol) 500 mg tablets - take max 2 tablets (1000 mg) every 6 hours (4 times per day)  OTC Ibuprofen (Motrin) 200 mg tablets - take max 4 tablets (800 mg) every 6 hours*   Sinus Congestion Prescription Atrovent as directed to clear out sinuses  OTC Nasal Saline if desired to rinse OTC Oxymetolazone (Afrin, others) sparing use due to rebound congestion, NEVER use in kids OTC Phenylephrine (Sudafed) 10 mg tablets every 4 hours (or the 12-hour formulation)* OTC Diphenhydramine (Benadryl) 25 mg tablets - take max 2 tablets every 4 hours   Cough & Sore Throat Prescription cough pills or syrups as directed OTC Dextromethorphan (Robitussin, others) - cough suppressant OTC Guaifenesin (Robitussin, Mucinex, others) - expectorant (helps cough up mucus) (Dextromethorphan and Guaifenesin also come in a combination tablet/syrup) OTC Lozenges w/ Benzocaine + Menthol (Cepacol) Honey - as much as you want! Teas which "coat the throat" - look for ingredients Elm Bark, Licorice Root, Marshmallow Root   Other Prescription Oral Steroids to decrease inflammation and improve energy Prescription Antibiotics if these are necessary for bacterial infection - take ALL, even if you're feeling better  OTC Zinc Lozenges within 24 hours of symptoms onset - mixed evidence this shortens the duration of the common cold Don't waste your money on Vitamin C or Echinacea in acute illness - it's already too late!    *Caution in patients with high blood pressure

## 2018-06-28 NOTE — Progress Notes (Signed)
HPI: Jillian Lee is a 42 y.o. female who  has a past medical history of Anemia, GERD (gastroesophageal reflux disease), History of kidney stones, HSV (herpes simplex virus) infection, Hypercholesterolemia, Hypertensive urgency (03/26/2015), Hypothyroidism (03/31/2015), and Ingrown right big toenail (1998).  she presents to [facility] today, 06/28/18,  for chief complaint of:  Coughing Med refills  2 weeks chest cold symptoms, coughing. Not getting any better. OTC medications not really helping. Temp up to 99.   Needs med refills for BP and Thyroid medications. No CP, SOB, Palpitations, fatigue. Needs labs done!       At today's visit... Past medical history, surgical history, and family history reviewed and updated as needed.  Current medication list and allergy/intolerance information reviewed and updated as needed. (See remainder of HPI, ROS, Phys Exam below)           ASSESSMENT/PLAN: The primary encounter diagnosis was Viral URI with cough. Diagnoses of Hypothyroidism due to acquired atrophy of thyroid and Essential hypertension, benign were also pertinent to this visit.  Lab orders printed, advised needs labs ASAP!    Meds ordered this encounter  Medications  . guaiFENesin-codeine 100-10 MG/5ML syrup    Sig: Take 5-10 mLs by mouth every 6 (six) hours as needed for cough.    Dispense:  120 mL    Refill:  0  . predniSONE (DELTASONE) 20 MG tablet    Sig: Take 1 tablet (20 mg total) by mouth 2 (two) times daily with a meal.    Dispense:  10 tablet    Refill:  0  . amoxicillin-clavulanate (AUGMENTIN) 875-125 MG tablet    Sig: Take 1 tablet by mouth 2 (two) times daily.    Dispense:  14 tablet    Refill:  0  . ipratropium (ATROVENT) 0.06 % nasal spray    Sig: Place 2 sprays into both nostrils 4 (four) times daily. As needed for sinus congestion    Dispense:  15 mL    Refill:  1  . levothyroxine (SYNTHROID, LEVOTHROID) 50 MCG  tablet    Sig: Take 1 tablet (50 mcg total) by mouth daily before breakfast. Must GET LABS!    Dispense:  30 tablet    Refill:  0  . lisinopril-hydrochlorothiazide (PRINZIDE,ZESTORETIC) 20-25 MG tablet    Sig: Take 1 tablet by mouth daily.    Dispense:  90 tablet    Refill:  3    Patient Instructions  Medications & Home Remedies for Upper Respiratory Illness   Note: the following list assumes no pregnancy, normal liver & kidney function and no other drug interactions. Dr. Sheppard Coil has highlighted medications which are safe for you to use, but these may not be appropriate for everyone. Always ask a pharmacist or qualified medical provider if you have any questions!    Aches/Pains, Fever, Headache OTC Acetaminophen (Tylenol) 500 mg tablets - take max 2 tablets (1000 mg) every 6 hours (4 times per day)  OTC Ibuprofen (Motrin) 200 mg tablets - take max 4 tablets (800 mg) every 6 hours*   Sinus Congestion Prescription Atrovent as directed to clear out sinuses  OTC Nasal Saline if desired to rinse OTC Oxymetolazone (Afrin, others) sparing use due to rebound congestion, NEVER use in kids OTC Phenylephrine (Sudafed) 10 mg tablets every 4 hours (or the 12-hour formulation)* OTC Diphenhydramine (Benadryl) 25 mg tablets - take max 2 tablets every 4 hours   Cough & Sore Throat Prescription cough pills or syrups as  directed OTC Dextromethorphan (Robitussin, others) - cough suppressant OTC Guaifenesin (Robitussin, Mucinex, others) - expectorant (helps cough up mucus) (Dextromethorphan and Guaifenesin also come in a combination tablet/syrup) OTC Lozenges w/ Benzocaine + Menthol (Cepacol) Honey - as much as you want! Teas which "coat the throat" - look for ingredients Elm Bark, Licorice Root, Marshmallow Root   Other Prescription Oral Steroids to decrease inflammation and improve energy Prescription Antibiotics if these are necessary for bacterial infection - take ALL, even if you're feeling  better  OTC Zinc Lozenges within 24 hours of symptoms onset - mixed evidence this shortens the duration of the common cold Don't waste your money on Vitamin C or Echinacea in acute illness - it's already too late!    *Caution in patients with high blood pressure        Follow-up plan: Return in about 6 months (around 12/28/2018) for [employer] .                             ############################################ ############################################ ############################################ ############################################    Current Meds  Medication Sig  . Ascorbic Acid (VITAMIN C) 1000 MG tablet Take 1,000 mg by mouth daily.  Marland Kitchen aspirin 81 MG chewable tablet Chew 1 tablet (81 mg total) by mouth daily.  . cefdinir (OMNICEF) 300 MG capsule Take 1 capsule (300 mg total) by mouth 2 (two) times daily.  . cetirizine (ZYRTEC) 10 MG tablet Take 10 mg by mouth at bedtime.   . ferrous sulfate 325 (65 FE) MG tablet Take 1 tablet (325 mg total) by mouth daily with breakfast.  . fluticasone (FLONASE) 50 MCG/ACT nasal spray One spray in each nostril twice a day, use left hand for right nostril, and right hand for left nostril.  Marland Kitchen guaiFENesin-codeine 100-10 MG/5ML syrup Take 5 mLs by mouth every 6 (six) hours as needed for cough.  Marland Kitchen ipratropium (ATROVENT) 0.06 % nasal spray Place 2 sprays into both nostrils 4 (four) times daily.  Marland Kitchen levonorgestrel-ethinyl estradiol (AVIANE,ALESSE,LESSINA) 0.1-20 MG-MCG tablet TAKE 1 TABLET BY MOUTH DAILY.  Marland Kitchen levothyroxine (SYNTHROID, LEVOTHROID) 50 MCG tablet Take 1 tablet (50 mcg total) by mouth daily before breakfast. Must schedule follow up  . lisinopril-hydrochlorothiazide (PRINZIDE,ZESTORETIC) 20-25 MG tablet TAKE 1 TABLET BY MOUTH DAILY.  . Multiple Vitamins-Minerals (MULTIVITAMIN WITH MINERALS) tablet Take 1 tablet by mouth daily.  . nitroGLYCERIN (NITROSTAT) 0.3 MG SL tablet Place 1 tablet (0.3 mg  total) under the tongue every 5 (five) minutes as needed for chest pain.  . pantoprazole (PROTONIX) 40 MG tablet Take 1 tablet (40 mg total) by mouth daily.  . predniSONE (DELTASONE) 10 MG tablet Take 3 tablets (30 mg total) by mouth daily with breakfast.  . ranitidine (ZANTAC) 300 MG tablet Take 1 tablet (300 mg total) by mouth at bedtime.  . valACYclovir (VALTREX) 500 MG tablet Take 1 tablet (500 mg total) by mouth daily.    Allergies  Allergen Reactions  . Clarithromycin Other (See Comments)    Doesn't feel well/ body aches   . Diphenhydramine        Review of Systems:  Constitutional: +recent illness  HEENT: +headache, no vision change  Cardiac: No  chest pain, No  pressure, No palpitations  Respiratory:  No  shortness of breath. +Cough  Gastrointestinal: No  abdominal pain, no change on bowel habits  Musculoskeletal: No new myalgia/arthralgia  Skin: No  Rash  Neurologic: No  weakness, No  Dizziness   Exam:  BP 124/71 (BP Location: Left Arm, Patient Position: Sitting, Cuff Size: Normal)   Pulse 73   Temp 97.7 F (36.5 C) (Oral)   Wt 124 lb (56.2 kg)   BMI 26.83 kg/m   Constitutional: VS see above. General Appearance: alert, well-developed, well-nourished, NAD  Eyes: Normal lids and conjunctive, non-icteric sclera  Ears, Nose, Mouth, Throat: MMM, Normal external inspection ears/nares/mouth/lips/gums. TM WNL bilaterally. (+)pharyngeal erythema without exudate.   Neck: No masses, trachea midline.   Respiratory: Normal respiratory effort. no wheeze, no rhonchi, no rales  Cardiovascular: S1/S2 normal, no murmur, no rub/gallop auscultated. RRR.   Musculoskeletal: Gait normal. Symmetric and independent movement of all extremities  Neurological: Normal balance/coordination. No tremor.  Skin: warm, dry, intact.   Psychiatric: Normal judgment/insight. Normal mood and affect. Oriented x3.       Visit summary with medication list and pertinent instructions  was printed for patient to review, patient was advised to alert [place] if any updates are needed. All questions at time of visit were answered - patient instructed to contact office with any additional concerns. ER/RTC precautions were reviewed with the patient and understanding verbalized.    Please note: voice recognition software was used to produce this document, and typos may escape review. Please contact Dr. Sheppard Coil for any needed clarifications.    Follow up plan: Return in about 6 months (around 12/28/2018) for [employer] .

## 2018-07-08 MED FILL — AMOX-CLAV 875-125 MG TABLET: 875-125 | 7 days supply | Qty: 14 | Fill #0

## 2018-07-16 ENCOUNTER — Other Ambulatory Visit: Payer: Self-pay | Admitting: Osteopathic Medicine

## 2018-07-16 DIAGNOSIS — B009 Herpesviral infection, unspecified: Secondary | ICD-10-CM

## 2018-07-16 MED FILL — VALACYCLOVIR HCL 500 MG TAB: 500 | 90 days supply | Qty: 90 | Fill #0

## 2018-07-16 MED FILL — LISINOPRIL-HCTZ 20-25 MG TA: 20-25 | 90 days supply | Qty: 90 | Fill #1

## 2018-07-16 NOTE — Telephone Encounter (Signed)
Please review for refill- patient at [facility]

## 2018-07-17 ENCOUNTER — Other Ambulatory Visit: Payer: Self-pay | Admitting: Osteopathic Medicine

## 2018-07-17 DIAGNOSIS — R7301 Impaired fasting glucose: Secondary | ICD-10-CM | POA: Diagnosis not present

## 2018-07-17 DIAGNOSIS — E782 Mixed hyperlipidemia: Secondary | ICD-10-CM

## 2018-07-17 DIAGNOSIS — I10 Essential (primary) hypertension: Secondary | ICD-10-CM | POA: Diagnosis not present

## 2018-07-17 DIAGNOSIS — R7303 Prediabetes: Secondary | ICD-10-CM

## 2018-07-17 DIAGNOSIS — E785 Hyperlipidemia, unspecified: Secondary | ICD-10-CM

## 2018-07-17 DIAGNOSIS — E034 Atrophy of thyroid (acquired): Secondary | ICD-10-CM

## 2018-07-19 LAB — CBC
HCT: 39.2 % (ref 35.0–45.0)
Hemoglobin: 13.4 g/dL (ref 11.7–15.5)
MCH: 31.8 pg (ref 27.0–33.0)
MCHC: 34.2 g/dL (ref 32.0–36.0)
MCV: 92.9 fL (ref 80.0–100.0)
MPV: 8.8 fL (ref 7.5–12.5)
Platelets: 272 10*3/uL (ref 140–400)
RBC: 4.22 10*6/uL (ref 3.80–5.10)
RDW: 12.3 % (ref 11.0–15.0)
WBC: 6.7 10*3/uL (ref 3.8–10.8)

## 2018-07-19 LAB — TEST AUTHORIZATION

## 2018-07-19 LAB — COMPLETE METABOLIC PANEL WITH GFR
AG Ratio: 1.6 (calc) (ref 1.0–2.5)
ALT: 25 U/L (ref 6–29)
AST: 17 U/L (ref 10–30)
Albumin: 3.9 g/dL (ref 3.6–5.1)
Alkaline phosphatase (APISO): 60 U/L (ref 33–115)
BUN: 22 mg/dL (ref 7–25)
CO2: 25 mmol/L (ref 20–32)
Calcium: 9 mg/dL (ref 8.6–10.2)
Chloride: 103 mmol/L (ref 98–110)
Creat: 0.86 mg/dL (ref 0.50–1.10)
GFR, Est African American: 95 mL/min/{1.73_m2} (ref >=60)
GFR, Est Non African American: 82 mL/min/{1.73_m2} (ref >=60)
GLOBULIN: 2.5 g/dL (ref 1.9–3.7)
Glucose, Bld: 104 mg/dL — ABNORMAL HIGH (ref 65–99)
Potassium: 3.7 mmol/L (ref 3.5–5.3)
Sodium: 137 mmol/L (ref 135–146)
Total Bilirubin: 0.4 mg/dL (ref 0.2–1.2)
Total Protein: 6.4 g/dL (ref 6.1–8.1)

## 2018-07-19 LAB — HEMOGLOBIN A1C W/OUT EAG: Hgb A1c MFr Bld: 6.1 % of total Hgb — ABNORMAL HIGH (ref <5.7)

## 2018-07-19 LAB — LIPID PANEL
Cholesterol: 336 mg/dL — ABNORMAL HIGH (ref <200)
HDL: 56 mg/dL (ref >50)
LDL Cholesterol (Calc): 241 mg/dL (calc) — ABNORMAL HIGH
Non-HDL Cholesterol (Calc): 280 mg/dL (calc) — ABNORMAL HIGH (ref <130)
Total CHOL/HDL Ratio: 6 (calc) — ABNORMAL HIGH (ref <5.0)
Triglycerides: 208 mg/dL — ABNORMAL HIGH (ref <150)

## 2018-07-19 LAB — TSH: TSH: 1.1 mIU/L

## 2018-07-23 MED ORDER — ATORVASTATIN CALCIUM 20 MG PO TABS: 20.0000 mg | ORAL_TABLET | Freq: Every day | ORAL | 3 refills | Status: DC

## 2018-07-23 NOTE — Addendum Note (Signed)
Addended by: Maryla Morrow on: 07/23/2018 08:05 PM   Modules accepted: Orders

## 2018-07-24 MED FILL — ATORVASTATIN CALCIUM 20 MG: 20 | 90 days supply | Qty: 90 | Fill #0

## 2018-07-29 ENCOUNTER — Other Ambulatory Visit: Payer: Self-pay | Admitting: Osteopathic Medicine

## 2018-07-29 DIAGNOSIS — E034 Atrophy of thyroid (acquired): Secondary | ICD-10-CM

## 2018-07-29 MED FILL — LEVOTHYROXINE 50 MCG TABLET: 50 | 30 days supply | Qty: 30 | Fill #0

## 2018-08-29 MED FILL — LEVOTHYROXINE 50 MCG TABLET: 50 | 30 days supply | Qty: 30 | Fill #1

## 2018-09-09 MED FILL — LARISSIA 0.1-20 MG-MCG TABS: 0.1-20 | 84 days supply | Qty: 84 | Fill #2 | Status: TO

## 2018-09-26 MED FILL — LEVOTHYROXINE 50 MCG TABLET: 50 | 30 days supply | Qty: 30 | Fill #2 | Status: TO

## 2018-10-07 MED FILL — VALACYCLOVIR HCL 500 MG TAB: 500 | 90 days supply | Qty: 90 | Fill #0

## 2018-10-07 MED FILL — LISINOPRIL-HCTZ 20-25 MG TA: 20-25 | 90 days supply | Qty: 90 | Fill #0

## 2018-10-18 MED FILL — ATORVASTATIN 20 MG TABLET: 20 | 90 days supply | Qty: 90 | Fill #0

## 2018-10-24 ENCOUNTER — Other Ambulatory Visit: Payer: Self-pay | Admitting: Osteopathic Medicine

## 2018-10-24 DIAGNOSIS — E034 Atrophy of thyroid (acquired): Secondary | ICD-10-CM

## 2018-10-29 MED FILL — LEVOTHYROXINE 50 MCG TABLET: 50 | 30 days supply | Qty: 30 | Fill #0

## 2018-11-25 MED FILL — LEVOTHYROXINE 50 MCG TABLET: 50 | 30 days supply | Qty: 30 | Fill #1

## 2018-11-26 MED FILL — LARISSIA 0.1-20 MG-MCG TABS: 0.1-20 | 84 days supply | Qty: 84 | Fill #0

## 2018-12-04 ENCOUNTER — Encounter: Payer: Self-pay | Admitting: Osteopathic Medicine

## 2018-12-04 ENCOUNTER — Ambulatory Visit (INDEPENDENT_AMBULATORY_CARE_PROVIDER_SITE_OTHER): Payer: 59 | Admitting: Osteopathic Medicine

## 2018-12-04 VITALS — Wt 121.0 lb

## 2018-12-04 DIAGNOSIS — R454 Irritability and anger: Secondary | ICD-10-CM

## 2018-12-04 DIAGNOSIS — K219 Gastro-esophageal reflux disease without esophagitis: Secondary | ICD-10-CM | POA: Diagnosis not present

## 2018-12-04 MED ORDER — SUCRALFATE 1 G PO TABS: 1.0000 g | ORAL_TABLET | Freq: Four times a day (QID) | ORAL | 0 refills | Status: DC

## 2018-12-04 MED ORDER — PANTOPRAZOLE SODIUM 40 MG PO TBEC: 40.0000 mg | DELAYED_RELEASE_TABLET | Freq: Two times a day (BID) | ORAL | 0 refills | Status: DC

## 2018-12-04 MED FILL — PANTOPRAZOLE SOD DR 40 MG T: 40 | 60 days supply | Qty: 120 | Fill #0

## 2018-12-04 NOTE — Progress Notes (Signed)
Virtual Visit via Video (App used: Doximity) Note  I connected with      Jillian Lee on 12/04/18 at 4:15 by a telemedicine application and verified that I am speaking with the correct person using two identifiers.  Patient is at home I am working from home    I discussed the limitations of evaluation and management by telemedicine and the availability of in person appointments. The patient expressed understanding and agreed to proceed.  History of Present Illness: Jillian Lee is a 43 y.o. female who would like to discuss  Chief Complaint  Patient presents with  . Gastroesophageal Reflux    Since Saturday - has been taking Prilosec OTC 20 mg x 3 days  . irritable/anxious    within the past year    Friday evening was feeling a bit sick, didn't really eat much, took some OTC meds and drank some milk. Hurt under her ribs in her abdomen, right in the middle feels like twisting pain. Feeling nauseous but no vomiting. Has been taking Prilosec for 3 days. Hurts worse with food, all food. No dysphagia.   Reports more anxious/irritable over the past year. Mom has hx bipolar. Pt has never been on psychiatric medications.    GAD 7 : Generalized Anxiety Score 12/04/2018 06/28/2018 11/02/2017  Nervous, Anxious, on Edge 1 0 0  Control/stop worrying 1 0 0  Worry too much - different things 1 1 0  Trouble relaxing 0 0 0  Restless 0 0 0  Easily annoyed or irritable 3 1 0  Afraid - awful might happen 0 0 0  Total GAD 7 Score 6 2 0  Anxiety Difficulty Not difficult at all Somewhat difficult Not difficult at all         Observations/Objective: Wt 121 lb (54.9 kg)   BMI 26.18 kg/m  BP Readings from Last 3 Encounters:  06/28/18 124/71  11/16/17 117/74  11/02/17 129/66   Exam: Normal Speech.  NAD  Lab and Radiology Results No results found for this or any previous visit (from the past 72 hour(s)). No results found.     Assessment and Plan: 43 y.o. female with The  primary encounter diagnosis was Gastroesophageal reflux disease, esophagitis presence not specified. A diagnosis of Irritability was also pertinent to this visit.   PDMP not reviewed this encounter. No orders of the defined types were placed in this encounter.  Meds ordered this encounter  Medications  . pantoprazole (PROTONIX) 40 MG tablet    Sig: Take 1 tablet (40 mg total) by mouth 2 (two) times daily. For 8 weeks    Dispense:  120 tablet    Refill:  0  . sucralfate (CARAFATE) 1 g tablet    Sig: Take 1 tablet (1 g total) by mouth 4 (four) times daily for 30 days.    Dispense:  120 tablet    Refill:  0   Patient Instructions  Consider Lexapro for anxiety / irritability If meds for reflux aren't helpful, or if symptoms recur after d/c tx, will refer to GI    Instructions sent via MyChart. If MyChart not available, pt was given option for info via personal e-mail w/ no guarantee of protected health info over unsecured e-mail communication, and MyChart sign-up instructions were included.   Follow Up Instructions: Return for virtual visit 1 week to recheck on acid reflux issues and discuss anxiety/irritability .    I discussed the assessment and treatment plan with the patient. The patient  was provided an opportunity to ask questions and all were answered. The patient agreed with the plan and demonstrated an understanding of the instructions.   The patient was advised to call back or seek an in-person evaluation if any new concerns, if symptoms worsen or if the condition fails to improve as anticipated.  25 minutes of non-face-to-face time was provided during this encounter.                      Historical information moved to improve visibility of documentation.  Past Medical History:  Diagnosis Date  . Anemia   . GERD (gastroesophageal reflux disease)   . History of kidney stones   . HSV (herpes simplex virus) infection   . Hypercholesterolemia   .  Hypertensive urgency 03/26/2015  . Hypothyroidism 03/31/2015  . Ingrown right big toenail 1998   Past Surgical History:  Procedure Laterality Date  . TOENAIL AVULSION     Social History   Tobacco Use  . Smoking status: Never Smoker  . Smokeless tobacco: Never Used  Substance Use Topics  . Alcohol use: No   family history includes Alcohol abuse in her maternal uncle and mother; Anxiety disorder in her mother; Cancer in her maternal grandmother; Dementia in her mother; Depression in her mother; Diabetes in her maternal grandmother and maternal uncle; Hypertension in her maternal uncle and mother; Stroke in her mother.  Medications: Current Outpatient Medications  Medication Sig Dispense Refill  . Ascorbic Acid (VITAMIN C) 1000 MG tablet Take 1,000 mg by mouth daily.    Marland Kitchen aspirin 81 MG chewable tablet Chew 1 tablet (81 mg total) by mouth daily. 90 tablet 3  . atorvastatin (LIPITOR) 20 MG tablet Take 1 tablet (20 mg total) by mouth daily. 90 tablet 3  . ferrous sulfate 325 (65 FE) MG tablet Take 1 tablet (325 mg total) by mouth daily with breakfast. 90 tablet 3  . fluticasone (FLONASE) 50 MCG/ACT nasal spray One spray in each nostril twice a day, use left hand for right nostril, and right hand for left nostril. 48 g 3  . ipratropium (ATROVENT) 0.06 % nasal spray Place 2 sprays into both nostrils 4 (four) times daily. As needed for sinus congestion 15 mL 1  . levonorgestrel-ethinyl estradiol (AVIANE,ALESSE,LESSINA) 0.1-20 MG-MCG tablet TAKE 1 TABLET BY MOUTH DAILY. 84 tablet 3  . levothyroxine (SYNTHROID, LEVOTHROID) 50 MCG tablet Take 1 tablet (50 mcg total) by mouth daily before breakfast. 30 tablet 5  . lisinopril-hydrochlorothiazide (PRINZIDE,ZESTORETIC) 20-25 MG tablet Take 1 tablet by mouth daily. 90 tablet 3  . Multiple Vitamins-Minerals (MULTIVITAMIN WITH MINERALS) tablet Take 1 tablet by mouth daily.    . nitroGLYCERIN (NITROSTAT) 0.3 MG SL tablet Place 1 tablet (0.3 mg total) under  the tongue every 5 (five) minutes as needed for chest pain. 15 tablet 12  . valACYclovir (VALTREX) 500 MG tablet TAKE 1 TABLET BY MOUTH DAILY. 90 tablet 1  . cetirizine (ZYRTEC) 10 MG tablet Take 10 mg by mouth at bedtime.     . pantoprazole (PROTONIX) 40 MG tablet Take 1 tablet (40 mg total) by mouth 2 (two) times daily. For 8 weeks 120 tablet 0  . sucralfate (CARAFATE) 1 g tablet Take 1 tablet (1 g total) by mouth 4 (four) times daily for 30 days. 120 tablet 0   No current facility-administered medications for this visit.    Allergies  Allergen Reactions  . Clarithromycin Other (See Comments)    Doesn't feel well/ body  aches     PDMP not reviewed this encounter. No orders of the defined types were placed in this encounter.  Meds ordered this encounter  Medications  . pantoprazole (PROTONIX) 40 MG tablet    Sig: Take 1 tablet (40 mg total) by mouth 2 (two) times daily. For 8 weeks    Dispense:  120 tablet    Refill:  0  . sucralfate (CARAFATE) 1 g tablet    Sig: Take 1 tablet (1 g total) by mouth 4 (four) times daily for 30 days.    Dispense:  120 tablet    Refill:  0

## 2018-12-05 ENCOUNTER — Encounter: Payer: Self-pay | Admitting: Osteopathic Medicine

## 2018-12-05 NOTE — Patient Instructions (Addendum)
Consider Lexapro for anxiety / irritability If meds for reflux aren't helpful, or if symptoms recur after d/c tx, will refer to GI

## 2018-12-13 ENCOUNTER — Ambulatory Visit: Payer: 59 | Admitting: Osteopathic Medicine

## 2018-12-16 ENCOUNTER — Ambulatory Visit (INDEPENDENT_AMBULATORY_CARE_PROVIDER_SITE_OTHER): Payer: 59 | Admitting: Osteopathic Medicine

## 2018-12-16 VITALS — Temp 96.7°F | Wt 120.0 lb

## 2018-12-16 DIAGNOSIS — N200 Calculus of kidney: Secondary | ICD-10-CM

## 2018-12-16 MED ORDER — HYDROCODONE-ACETAMINOPHEN 5-325 MG PO TABS: 1.0000 | ORAL_TABLET | Freq: Four times a day (QID) | ORAL | 0 refills | Status: AC | PRN

## 2018-12-16 MED ORDER — TAMSULOSIN HCL 0.4 MG PO CAPS: 0.4000 mg | ORAL_CAPSULE | Freq: Every day | ORAL | 0 refills | Status: DC

## 2018-12-16 MED ORDER — HYDROCODONE-ACETAMINOPHEN 5-325 MG PO TABS: 1.0000 | ORAL_TABLET | Freq: Four times a day (QID) | ORAL | 0 refills | Status: DC | PRN

## 2018-12-16 MED FILL — TAMSULOSIN HCL 0.4 MG CAP: 0.4 | 30 days supply | Qty: 30 | Fill #0

## 2018-12-16 MED FILL — HYDROCODON-APAP 5-325: 5-325 | 5 days supply | Qty: 30 | Fill #0

## 2018-12-16 NOTE — Progress Notes (Signed)
Virtual Visit via Video (App used: Doximity) Note  I connected with      Jillian Lee on 12/16/18 at 11:14 AM by a telemedicine application and verified that I am speaking with the correct person using two identifiers.  Patient is at home I am working form home    I discussed the limitations of evaluation and management by telemedicine and the availability of in person appointments. The patient expressed understanding and agreed to proceed.  History of Present Illness: Jillian Lee is a 43 y.o. female who would like to discuss  Chief Complaint  Patient presents with  . Pain    Kidney stone    Pain on L flank ongoing about 3 days.  Passed one stone she thinks 2 days ago.  No fever or nausea, no urinary frequency, no GI symptoms.       Observations/Objective: Temp (!) 96.7 F (35.9 C) (Oral)   Wt 120 lb (54.4 kg)   BMI 25.97 kg/m  BP Readings from Last 3 Encounters:  06/28/18 124/71  11/16/17 117/74  11/02/17 129/66   Exam: Normal Speech.  NAD  Lab and Radiology Results No results found for this or any previous visit (from the past 72 hour(s)). No results found.     Assessment and Plan: 43 y.o. female with The encounter diagnosis was Nephrolithiasis.  Advised would get CT to confirm nephrolithiasis if symptoms persist/worsen. For now ok to treat w/ opiates and flomax and f/u in a few days depending on symptoms.   ER precautions reviewed.   PDMP reviewed during this encounter. No orders of the defined types were placed in this encounter.  Meds ordered this encounter  Medications  . DISCONTD: HYDROcodone-acetaminophen (NORCO/VICODIN) 5-325 MG tablet    Sig: Take 1-2 tablets by mouth every 6 (six) hours as needed for up to 5 days for moderate pain or severe pain.    Dispense:  30 tablet    Refill:  0  . tamsulosin (FLOMAX) 0.4 MG CAPS capsule    Sig: Take 1 capsule (0.4 mg total) by mouth daily. Until passage of stone, max use 4 weeks   Dispense:  30 capsule    Refill:  0  . HYDROcodone-acetaminophen (NORCO/VICODIN) 5-325 MG tablet    Sig: Take 1-2 tablets by mouth every 6 (six) hours as needed for up to 5 days for moderate pain or severe pain.    Dispense:  30 tablet    Refill:  0   There are no Patient Instructions on file for this visit.  Instructions sent via MyChart. If MyChart not available, pt was given option for info via personal e-mail w/ no guarantee of protected health info over unsecured e-mail communication, and MyChart sign-up instructions were included.   Follow Up Instructions: Return if symptoms worsen or fail to improve.    I discussed the assessment and treatment plan with the patient. The patient was provided an opportunity to ask questions and all were answered. The patient agreed with the plan and demonstrated an understanding of the instructions.   The patient was advised to call back or seek an in-person evaluation if any new concerns, if symptoms worsen or if the condition fails to improve as anticipated.  25 minutes of non-face-to-face time was provided during this encounter.                      Historical information moved to improve visibility of documentation.  Past Medical History:  Diagnosis Date  . Anemia   . GERD (gastroesophageal reflux disease)   . History of kidney stones   . HSV (herpes simplex virus) infection   . Hypercholesterolemia   . Hypertensive urgency 03/26/2015  . Hypothyroidism 03/31/2015  . Ingrown right big toenail 1998   Past Surgical History:  Procedure Laterality Date  . TOENAIL AVULSION     Social History   Tobacco Use  . Smoking status: Never Smoker  . Smokeless tobacco: Never Used  Substance Use Topics  . Alcohol use: No   family history includes Alcohol abuse in her maternal uncle and mother; Anxiety disorder in her mother; Cancer in her maternal grandmother; Dementia in her mother; Depression in her mother; Diabetes in her  maternal grandmother and maternal uncle; Hypertension in her maternal uncle and mother; Stroke in her mother.  Medications: Current Outpatient Medications  Medication Sig Dispense Refill  . Ascorbic Acid (VITAMIN C) 1000 MG tablet Take 1,000 mg by mouth daily.    Marland Kitchen aspirin 81 MG chewable tablet Chew 1 tablet (81 mg total) by mouth daily. 90 tablet 3  . atorvastatin (LIPITOR) 20 MG tablet Take 1 tablet (20 mg total) by mouth daily. 90 tablet 3  . cetirizine (ZYRTEC) 10 MG tablet Take 10 mg by mouth at bedtime.     . ferrous sulfate 325 (65 FE) MG tablet Take 1 tablet (325 mg total) by mouth daily with breakfast. 90 tablet 3  . fluticasone (FLONASE) 50 MCG/ACT nasal spray One spray in each nostril twice a day, use left hand for right nostril, and right hand for left nostril. 48 g 3  . ipratropium (ATROVENT) 0.06 % nasal spray Place 2 sprays into both nostrils 4 (four) times daily. As needed for sinus congestion 15 mL 1  . levonorgestrel-ethinyl estradiol (AVIANE,ALESSE,LESSINA) 0.1-20 MG-MCG tablet TAKE 1 TABLET BY MOUTH DAILY. 84 tablet 3  . levothyroxine (SYNTHROID, LEVOTHROID) 50 MCG tablet Take 1 tablet (50 mcg total) by mouth daily before breakfast. 30 tablet 5  . lisinopril-hydrochlorothiazide (PRINZIDE,ZESTORETIC) 20-25 MG tablet Take 1 tablet by mouth daily. 90 tablet 3  . Multiple Vitamins-Minerals (MULTIVITAMIN WITH MINERALS) tablet Take 1 tablet by mouth daily.    . nitroGLYCERIN (NITROSTAT) 0.3 MG SL tablet Place 1 tablet (0.3 mg total) under the tongue every 5 (five) minutes as needed for chest pain. 15 tablet 12  . pantoprazole (PROTONIX) 40 MG tablet Take 1 tablet (40 mg total) by mouth 2 (two) times daily. For 8 weeks 120 tablet 0  . sucralfate (CARAFATE) 1 g tablet Take 1 tablet (1 g total) by mouth 4 (four) times daily for 30 days. 120 tablet 0  . valACYclovir (VALTREX) 500 MG tablet TAKE 1 TABLET BY MOUTH DAILY. 90 tablet 1  . HYDROcodone-acetaminophen (NORCO/VICODIN) 5-325 MG  tablet Take 1-2 tablets by mouth every 6 (six) hours as needed for up to 5 days for moderate pain or severe pain. 30 tablet 0  . tamsulosin (FLOMAX) 0.4 MG CAPS capsule Take 1 capsule (0.4 mg total) by mouth daily. Until passage of stone, max use 4 weeks 30 capsule 0   No current facility-administered medications for this visit.    Allergies  Allergen Reactions  . Clarithromycin Other (See Comments)    Doesn't feel well/ body aches     PDMP reviewed during this encounter. No orders of the defined types were placed in this encounter.  Meds ordered this encounter  Medications  . DISCONTD: HYDROcodone-acetaminophen (NORCO/VICODIN) 5-325 MG tablet  Sig: Take 1-2 tablets by mouth every 6 (six) hours as needed for up to 5 days for moderate pain or severe pain.    Dispense:  30 tablet    Refill:  0  . tamsulosin (FLOMAX) 0.4 MG CAPS capsule    Sig: Take 1 capsule (0.4 mg total) by mouth daily. Until passage of stone, max use 4 weeks    Dispense:  30 capsule    Refill:  0  . HYDROcodone-acetaminophen (NORCO/VICODIN) 5-325 MG tablet    Sig: Take 1-2 tablets by mouth every 6 (six) hours as needed for up to 5 days for moderate pain or severe pain.    Dispense:  30 tablet    Refill:  0

## 2018-12-17 ENCOUNTER — Ambulatory Visit: Payer: 59 | Admitting: Osteopathic Medicine

## 2018-12-19 ENCOUNTER — Telehealth: Payer: Self-pay | Admitting: Osteopathic Medicine

## 2018-12-19 DIAGNOSIS — R109 Unspecified abdominal pain: Secondary | ICD-10-CM

## 2018-12-19 DIAGNOSIS — N200 Calculus of kidney: Secondary | ICD-10-CM

## 2018-12-19 NOTE — Telephone Encounter (Signed)
Pt advised.

## 2018-12-19 NOTE — Telephone Encounter (Signed)
Order is in.

## 2018-12-19 NOTE — Telephone Encounter (Signed)
Patient called and states that she had a virtual visit with Dr.Alexander  A few days ago re: ? Kidney stones and told pt IF she was not feeling any better to call back and we could order a scan. Pt states she is not feeling any better and would like to proceed with the scan. Thanks

## 2018-12-19 NOTE — Addendum Note (Signed)
Addended by: Maryla Morrow on: 12/19/2018 02:41 PM   Modules accepted: Orders

## 2018-12-20 ENCOUNTER — Other Ambulatory Visit: Payer: Self-pay

## 2018-12-20 ENCOUNTER — Ambulatory Visit (INDEPENDENT_AMBULATORY_CARE_PROVIDER_SITE_OTHER): Payer: 59

## 2018-12-20 DIAGNOSIS — I700 Atherosclerosis of aorta: Secondary | ICD-10-CM | POA: Diagnosis not present

## 2018-12-20 DIAGNOSIS — R109 Unspecified abdominal pain: Secondary | ICD-10-CM | POA: Diagnosis not present

## 2018-12-20 DIAGNOSIS — N200 Calculus of kidney: Secondary | ICD-10-CM | POA: Diagnosis not present

## 2018-12-20 NOTE — Addendum Note (Signed)
Addended by: Maryla Morrow on: 12/20/2018 02:50 PM   Modules accepted: Orders

## 2018-12-25 ENCOUNTER — Telehealth: Payer: Self-pay

## 2018-12-25 NOTE — Telephone Encounter (Signed)
Pt called - mentioned she has not heard back about appt scheduling with [facility]. I've provided pt with contact number to Urology office to make an appt. Also wanted to know if provider received intermittent FMLA form? Pls advise, thanks.

## 2018-12-26 DIAGNOSIS — N200 Calculus of kidney: Secondary | ICD-10-CM | POA: Diagnosis not present

## 2018-12-26 MED FILL — LEVOTHYROXINE 50 MCG TABLET: 50 | 30 days supply | Qty: 30 | Fill #0

## 2018-12-26 NOTE — Telephone Encounter (Signed)
[employer]ML[employer] form, I will be filling this out by tomorrow afternoon on my administrative time.

## 2018-12-26 NOTE — Telephone Encounter (Signed)
Called patient and she states that her supervisor is wanting her to make sure this is completed by tomorrow. She is also requesting a call when this is completed. FYI.

## 2018-12-27 ENCOUNTER — Encounter: Payer: Self-pay | Admitting: Osteopathic Medicine

## 2018-12-27 ENCOUNTER — Other Ambulatory Visit: Payer: Self-pay | Admitting: Urology

## 2018-12-27 NOTE — Telephone Encounter (Signed)
Patient wants to know if she still needs to take the Flomax. She reports the kidney stones are too big to pass and is having surgery on Tuesday. Please advise.    Patient is also aware that her forms were faxed to her supervisor.

## 2018-12-27 NOTE — Telephone Encounter (Signed)
Done and faxed before I left today

## 2018-12-28 ENCOUNTER — Other Ambulatory Visit (HOSPITAL_COMMUNITY)
Admission: RE | Admit: 2018-12-28 | Discharge: 2018-12-28 | Disposition: A | Discharge status: Home / Self Care | Payer: 59 | Source: Ambulatory Visit | Attending: Urology | Admitting: Urology

## 2018-12-28 DIAGNOSIS — Z1159 Encounter for screening for other viral diseases: Secondary | ICD-10-CM | POA: Diagnosis not present

## 2018-12-29 LAB — SARS CORONAVIRUS 2 (TAT 6-24 HRS): SARS Coronavirus 2: NEGATIVE

## 2018-12-30 ENCOUNTER — Encounter (HOSPITAL_COMMUNITY)
Admission: RE | Admit: 2018-12-30 | Discharge: 2018-12-30 | Disposition: A | Discharge status: Home / Self Care | Payer: 59 | Source: Ambulatory Visit | Attending: Urology | Admitting: Urology

## 2018-12-30 ENCOUNTER — Other Ambulatory Visit: Payer: Self-pay

## 2018-12-30 ENCOUNTER — Encounter (HOSPITAL_COMMUNITY): Payer: Self-pay

## 2018-12-30 DIAGNOSIS — Z793 Long term (current) use of hormonal contraceptives: Secondary | ICD-10-CM | POA: Diagnosis not present

## 2018-12-30 DIAGNOSIS — Z79890 Hormone replacement therapy (postmenopausal): Secondary | ICD-10-CM | POA: Diagnosis not present

## 2018-12-30 DIAGNOSIS — Z8249 Family history of ischemic heart disease and other diseases of the circulatory system: Secondary | ICD-10-CM | POA: Diagnosis not present

## 2018-12-30 DIAGNOSIS — Z79891 Long term (current) use of opiate analgesic: Secondary | ICD-10-CM | POA: Diagnosis not present

## 2018-12-30 DIAGNOSIS — K219 Gastro-esophageal reflux disease without esophagitis: Secondary | ICD-10-CM | POA: Diagnosis not present

## 2018-12-30 DIAGNOSIS — E7800 Pure hypercholesterolemia, unspecified: Secondary | ICD-10-CM | POA: Diagnosis not present

## 2018-12-30 DIAGNOSIS — Z79899 Other long term (current) drug therapy: Secondary | ICD-10-CM | POA: Diagnosis not present

## 2018-12-30 DIAGNOSIS — R9431 Abnormal electrocardiogram [ECG] [EKG]: Secondary | ICD-10-CM | POA: Insufficient documentation

## 2018-12-30 DIAGNOSIS — Z7982 Long term (current) use of aspirin: Secondary | ICD-10-CM | POA: Diagnosis not present

## 2018-12-30 DIAGNOSIS — N200 Calculus of kidney: Secondary | ICD-10-CM | POA: Insufficient documentation

## 2018-12-30 DIAGNOSIS — Z01818 Encounter for other preprocedural examination: Secondary | ICD-10-CM | POA: Insufficient documentation

## 2018-12-30 DIAGNOSIS — E0590 Thyrotoxicosis, unspecified without thyrotoxic crisis or storm: Secondary | ICD-10-CM | POA: Diagnosis not present

## 2018-12-30 HISTORY — DX: Prediabetes: R73.03

## 2018-12-30 LAB — BASIC METABOLIC PANEL
Anion gap: 8 (ref 5–15)
BUN: 23 mg/dL — ABNORMAL HIGH (ref 6–20)
CO2: 26 mmol/L (ref 22–32)
Calcium: 8.9 mg/dL (ref 8.9–10.3)
Chloride: 96 mmol/L — ABNORMAL LOW (ref 98–111)
Creatinine, Ser: 0.91 mg/dL (ref 0.44–1.00)
GFR calc Af Amer: >60 mL/min (ref >60)
GFR calc non Af Amer: >60 mL/min (ref >60)
Glucose, Bld: 93 mg/dL (ref 70–99)
Potassium: 3.3 mmol/L — ABNORMAL LOW (ref 3.5–5.1)
Sodium: 130 mmol/L — ABNORMAL LOW (ref 135–145)

## 2018-12-30 LAB — CBC
HCT: 45 % (ref 36.0–46.0)
Hemoglobin: 14.9 g/dL (ref 12.0–15.0)
MCH: 31.9 pg (ref 26.0–34.0)
MCHC: 33.1 g/dL (ref 30.0–36.0)
MCV: 96.4 fL (ref 80.0–100.0)
Platelets: 299 10*3/uL (ref 150–400)
RBC: 4.67 MIL/uL (ref 3.87–5.11)
RDW: 12.5 % (ref 11.5–15.5)
WBC: 9 10*3/uL (ref 4.0–10.5)
nRBC: 0 % (ref 0.0–0.2)

## 2018-12-30 LAB — HEMOGLOBIN A1C
Hgb A1c MFr Bld: 5.8 % — ABNORMAL HIGH (ref 4.8–5.6)
Mean Plasma Glucose: 119.76 mg/dL

## 2018-12-30 NOTE — Progress Notes (Signed)
Chart given to Konrad Felix, PA to review BMP results.

## 2018-12-30 NOTE — Patient Instructions (Addendum)
MAISEE VOLLMAN  12/30/2018   Your procedure is scheduled on: Tuesday 12/31/2018  Report to [facility] Main  Entrance              Report to admitting at  1000  AM               ONCE YOUR COVID TEST IS COMPLETED, PLEASE BEGIN THE QUARANTINE INSTRUCTIONS AS OUTLINED IN YOUR HANDOUT.   Call this number if you have problems the morning of surgery [phone]    Remember: Do not eat food or drink liquids :After Midnight.               BRUSH YOUR TEETH MORNING OF SURGERY AND RINSE YOUR MOUTH OUT, NO CHEWING GUM CANDY OR MINTS.     Take these medicines the morning of surgery with A SIP OF WATER: Atorvastatin (Lipitor), Levothyroxine (Synthroid), Tamulosin (Flomax)                                  You may not have any metal on your body including hair pins and              piercings  Do not wear jewelry, make-up, lotions, powders or perfumes, deodorant             Do not wear nail polish.  Do not shave  48 hours prior to surgery.                Do not bring valuables to the hospital. [place].  Contacts, dentures or bridgework may not be worn into surgery.  Leave suitcase in the car. After surgery it may be brought to your room.     Patients discharged the day of surgery will not be allowed to drive home. IF YOU ARE HAVING SURGERY AND GOING HOME THE SAME DAY, YOU MUST HAVE AN ADULT TO DRIVE YOU HOME AND BE WITH YOU FOR 24 HOURS. YOU MAY GO HOME BY TAXI OR UBER OR ORTHERWISE, BUT AN ADULT MUST ACCOMPANY YOU HOME AND STAY WITH YOU FOR 24 HOURS.  Name and phone number of your driver: spouse- Lanny Hurst  [phone]               Please read over the following fact sheets you were given: _____________________________________________________________________  [facility] - Preparing for Surgery Before surgery, you can play an important role.  Because skin is not sterile, your skin needs to be as free of germs as  possible.  You can reduce the number of germs on your skin by washing with CHG (chlorahexidine gluconate) soap before surgery.  CHG is an antiseptic cleaner which kills germs and bonds with the skin to continue killing germs even after washing. Please DO NOT use if you have an allergy to CHG or antibacterial soaps.  If your skin becomes reddened/irritated stop using the CHG and inform your nurse when you arrive at Short Stay. Do not shave (including legs and underarms) for at least 48 hours prior to the first CHG shower.  You may shave your face/neck. Please follow these instructions carefully:  1.  Shower with CHG Soap the night before surgery and the  morning of Surgery.  2.  If you choose  to wash your hair, wash your hair first as usual with your  normal  shampoo.  3.  After you shampoo, rinse your hair and body thoroughly to remove the  shampoo.                           4.  Use CHG as you would any other liquid soap.  You can apply chg directly  to the skin and wash                       Gently with a scrungie or clean washcloth.  5.  Apply the CHG Soap to your body ONLY FROM THE NECK DOWN.   Do not use on face/ open                           Wound or open sores. Avoid contact with eyes, ears mouth and genitals (private parts).                       Wash face,  Genitals (private parts) with your normal soap.             6.  Wash thoroughly, paying special attention to the area where your surgery  will be performed.  7.  Thoroughly rinse your body with warm water from the neck down.  8.  DO NOT shower/wash with your normal soap after using and rinsing off  the CHG Soap.                9.  Pat yourself dry with a clean towel.            10.  Wear clean pajamas.            11.  Place clean sheets on your bed the night of your first shower and do not  sleep with pets. Day of Surgery : Do not apply any lotions/deodorants the morning of surgery.  Please wear clean clothes to the hospital/surgery  center.  FAILURE TO FOLLOW THESE INSTRUCTIONS MAY RESULT IN THE CANCELLATION OF YOUR SURGERY PATIENT SIGNATURE_________________________________  NURSE SIGNATURE__________________________________  ________________________________________________________________________

## 2018-12-30 NOTE — Telephone Encounter (Signed)
Patient was notified and did not have any additional questions. She will talk with Urology to make sure she can stop taking the medication.

## 2018-12-30 NOTE — Telephone Encounter (Signed)
I do not think stopping the Flomax would be a problem, but I would probably have her contact the urologist and follow whatever their recommendations are

## 2018-12-30 NOTE — H&P (Signed)
Office Visit Report     12/26/2018   --------------------------------------------------------------------------------   Jillian Lee  MRN: [id number]  PRIMARY CARE:  Emeterio Reeve, DO  DOB: 25-Jan-1976, 43 year old Female  REFERRING:  Emeterio Reeve, DO  SSN:   PROVIDER:  Festus Aloe, M.D.    LOCATION:  [facility] [phone]   --------------------------------------------------------------------------------   CC: I have kidney stones.  HPI: Jillian Lee is a 43 year-old female patient who was referred by Dr. Emeterio Reeve, DO who is here for renal calculi.  The problem is on the left side. She first stated noticing pain on 12/20/2018. This is not her first kidney stone. She has had more than 5 stones prior to getting this one. She is currently having flank pain. She denies having back pain, groin pain, nausea, vomiting, fever, and chills. She has not caught a stone in her urine strainer since her symptoms began.   She has never had surgical treatment for calculi in the past.   Genuine CT scan of the abdomen and pelvis December 20, 2018. This revealed a 17 x 12 mm left renal pelvic stone, HU 1500, ssd 7 cm; and there was also an 8-9 mm left lower pole stone. There were small stones or nephrocalcinosis on the right.   Her UA today is clear. She's passed stones since age 3 or 50.   She is well today. She is staying well hydrated. She quit soda 4 yrs ago but drank energy drinks. She has no heart or lung disease and no blood thinners.   Modifying factors: There are no other modifying factors  Associated signs and symptoms: There are no other associated signs and symptoms  Aggravating and relieving factors: There are no other aggravating or relieving factors  Severity: Moderate  Duration: Persistent      ALLERGIES: clarithromycin    MEDICATIONS: Levothyroxine Sodium 50 mcg tablet  Aspirin Ec 81 mg tablet, delayed release  Atorvastatin Calcium 20 mg  tablet  Aviane 0.1 mg-20 mcg tablet  Cetirizine Hcl 10 mg tablet  Ferrous Sulfate 325 mg (65 mg iron) tablet  Fluticasone Propionate 50 mcg/actuation spray, suspension  Ipratropium Bromide 42 mcg (0.06 %) aerosol, spray  Lisinopril-Hydrochlorothiazide 20 mg-25 mg tablet  Multiple Vitamin  Nitroglycerin 0.3 mg tablet, sublingual  Oxycodone-Acetaminophen 5 mg-325 mg tablet  Pantoprazole Sodium 40 mg tablet, delayed release  Valacyclovir 500 mg tablet  Vitamin C     GU PSH: None   NON-GU PSH: None   GU PMH: None   NON-GU PMH: GERD Hypercholesterolemia Hypertension Hyperthyroidism    FAMILY HISTORY: Death of family member - Mother Hypertension - Runs in Family Kidney Stones - Grandmother, Uncle    Notes: 1 son   SOCIAL HISTORY: Marital Status: Married Preferred Language: English; Ethnicity: Not Hispanic Or Latino; Race: White Current Smoking Status: Patient has never smoked.   Tobacco Use Assessment Completed: Used Tobacco in last 30 days? Does not use smokeless tobacco. Drinks 1 drink per month.  Drinks 1 caffeinated drink per day. 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    REVIEW OF SYSTEMS:    GU Review Female:   Patient reports frequent urination, burning /pain with urination, and get up at night to urinate. Patient denies hard to postpone urination, leakage of urine, stream starts and stops, trouble starting your stream, have to strain to urinate, and being pregnant.  Gastrointestinal (Upper):   Patient reports nausea and indigestion/ heartburn. Patient denies vomiting.  Gastrointestinal (Lower):   Patient  denies diarrhea and constipation.  Constitutional:   Patient denies fever, night sweats, weight loss, and fatigue.  Skin:   Patient denies skin rash/ lesion and itching.  Eyes:   Patient denies double vision and blurred vision.  Ears/ Nose/ Throat:   Patient denies sore throat and sinus problems.  Hematologic/Lymphatic:   Patient denies swollen glands and easy  bruising.  Cardiovascular:   Patient denies leg swelling and chest pains.  Respiratory:   Patient denies cough and shortness of breath.  Endocrine:   Patient denies excessive thirst.  Musculoskeletal:   Patient denies back pain and joint pain.  Neurological:   Patient reports headaches. Patient denies dizziness.  Psychologic:   Patient reports anxiety. Patient denies depression.   VITAL SIGNS:      12/26/2018 08:40 AM  Weight 121 lb / 54.88 kg  Height 59 in / 149.86 cm  BP 144/83 mmHg  Pulse 87 /min  Temperature 98.0 F / 36.6 C  BMI 24.4 kg/m   MULTI-SYSTEM PHYSICAL EXAMINATION:    Constitutional: Well-nourished. No physical deformities. Normally developed. Good grooming. She looks well. No pain.   Neck: Neck symmetrical, not swollen. Normal tracheal position.  Respiratory: No labored breathing, no use of accessory muscles.   Cardiovascular: Normal temperature, normal extremity pulses, no swelling, no varicosities.  Skin: No paleness, no jaundice, no cyanosis. No lesion, no ulcer, no rash.  Neurologic / Psychiatric: Oriented to time, oriented to place, oriented to person. No depression, no anxiety, no agitation.  Gastrointestinal: No mass, no tenderness, no rigidity, non obese abdomen.  Eyes: Normal conjunctivae. Normal eyelids.  Ears, Nose, Mouth, and Throat: Left ear no scars, no lesions, no masses. Right ear no scars, no lesions, no masses. Nose no scars, no lesions, no masses. Normal hearing. Normal lips.  Musculoskeletal: Normal gait and station of head and neck.     PAST DATA REVIEWED:  Source Of History:  Patient  X-Ray Review: C.T. Abdomen/Pelvis: Reviewed Films. June 2020    PROCEDURES:          Urinalysis w/Scope Dipstick Dipstick Cont'd Micro  Color: Straw Bilirubin: Neg mg/dL WBC/hpf: 0 - 5/hpf  Appearance: Clear Ketones: Neg mg/dL RBC/hpf: 0 - 2/hpf  Specific Gravity: <=1.005 Blood: Neg ery/uL Bacteria: NS (Not Seen)  pH: 6.0 Protein: Neg mg/dL Cystals: NS (Not  Seen)  Glucose: Neg mg/dL Urobilinogen: 0.2 mg/dL Casts: NS (Not Seen)    Nitrites: Neg Trichomonas: Not Present    Leukocyte Esterase: Trace leu/uL Mucous: Not Present      Epithelial Cells: NS (Not Seen)      Yeast: NS (Not Seen)      Sperm: Not Present    ASSESSMENT:      ICD-10 Details  1 GU:   Renal calculus - N20.0    PLAN:           Schedule Return Visit/Planned Activity: Next Available Appointment - Schedule Surgery          Document Letter(s):  Created for Patient: Clinical Summary         Notes:   renal stones left - I discussed with the patient the nature risks and benefits of staged stent/shockwave lithotripsy/ureteroscopy or PCNL. All questions answered. She was concerned about infection. We discussed bleeding and blood transfusion. We discussed post-op with stent or Nx tube and possible URS. I recommended PCNL for stone size, density and clearance but she is really resistant to any invasive procedure. She cleans 6E in hospital. So, we'll plan a  staged stent/URS. She can work now and with a stent which is a plus.   cc: Dr. Sheppard Coil      * Signed by Festus Aloe, M.D. on 12/26/18 at 5:33 PM (EDT)*     The information contained in this medical record document is considered private and confidential patient information. This information can only be used for the medical diagnosis and/or medical services that are being provided by the patient's selected caregivers. This information can only be distributed outside of the patient's care if the patient agrees and signs waivers of authorization for this information to be sent to an outside source or route.

## 2018-12-31 ENCOUNTER — Other Ambulatory Visit: Payer: Self-pay

## 2018-12-31 ENCOUNTER — Ambulatory Visit (HOSPITAL_COMMUNITY): Payer: 59 | Admitting: Physician Assistant

## 2018-12-31 ENCOUNTER — Encounter (HOSPITAL_COMMUNITY): Payer: Self-pay | Admitting: Emergency Medicine

## 2018-12-31 ENCOUNTER — Ambulatory Visit (HOSPITAL_COMMUNITY)
Admission: RE | Admit: 2018-12-31 | Discharge: 2018-12-31 | Disposition: A | Discharge status: Home / Self Care | Payer: 59 | Source: Ambulatory Visit | Attending: Urology | Admitting: Urology

## 2018-12-31 ENCOUNTER — Ambulatory Visit (HOSPITAL_COMMUNITY): Payer: 59 | Admitting: Certified Registered"

## 2018-12-31 ENCOUNTER — Encounter (HOSPITAL_COMMUNITY)
Admission: RE | Disposition: A | Discharge status: Home / Self Care | Payer: Self-pay | Source: Ambulatory Visit | Attending: Urology

## 2018-12-31 ENCOUNTER — Ambulatory Visit (HOSPITAL_COMMUNITY): Payer: 59

## 2018-12-31 DIAGNOSIS — N200 Calculus of kidney: Secondary | ICD-10-CM | POA: Diagnosis not present

## 2018-12-31 DIAGNOSIS — E0590 Thyrotoxicosis, unspecified without thyrotoxic crisis or storm: Secondary | ICD-10-CM | POA: Diagnosis not present

## 2018-12-31 DIAGNOSIS — Z8249 Family history of ischemic heart disease and other diseases of the circulatory system: Secondary | ICD-10-CM | POA: Diagnosis not present

## 2018-12-31 DIAGNOSIS — Z793 Long term (current) use of hormonal contraceptives: Secondary | ICD-10-CM | POA: Insufficient documentation

## 2018-12-31 DIAGNOSIS — Z79890 Hormone replacement therapy (postmenopausal): Secondary | ICD-10-CM | POA: Insufficient documentation

## 2018-12-31 DIAGNOSIS — K219 Gastro-esophageal reflux disease without esophagitis: Secondary | ICD-10-CM | POA: Insufficient documentation

## 2018-12-31 DIAGNOSIS — N202 Calculus of kidney with calculus of ureter: Secondary | ICD-10-CM | POA: Diagnosis not present

## 2018-12-31 DIAGNOSIS — Z79891 Long term (current) use of opiate analgesic: Secondary | ICD-10-CM | POA: Diagnosis not present

## 2018-12-31 DIAGNOSIS — E7800 Pure hypercholesterolemia, unspecified: Secondary | ICD-10-CM | POA: Diagnosis not present

## 2018-12-31 DIAGNOSIS — E039 Hypothyroidism, unspecified: Secondary | ICD-10-CM | POA: Diagnosis not present

## 2018-12-31 DIAGNOSIS — Z7982 Long term (current) use of aspirin: Secondary | ICD-10-CM | POA: Insufficient documentation

## 2018-12-31 DIAGNOSIS — Z79899 Other long term (current) drug therapy: Secondary | ICD-10-CM | POA: Diagnosis not present

## 2018-12-31 DIAGNOSIS — E119 Type 2 diabetes mellitus without complications: Secondary | ICD-10-CM | POA: Diagnosis not present

## 2018-12-31 DIAGNOSIS — I10 Essential (primary) hypertension: Secondary | ICD-10-CM | POA: Diagnosis not present

## 2018-12-31 HISTORY — PX: CYSTOSCOPY/URETEROSCOPY/HOLMIUM LASER/STENT PLACEMENT: SHX6546

## 2018-12-31 LAB — PREGNANCY, URINE: Preg Test, Ur: NEGATIVE

## 2018-12-31 SURGERY — CYSTOSCOPY/URETEROSCOPY/HOLMIUM LASER/STENT PLACEMENT
Anesthesia: General | Site: Ureter | Laterality: Left

## 2018-12-31 MED ORDER — HYDROMORPHONE HCL 1 MG/ML IJ SOLN
0.2500 mg | INTRAMUSCULAR | Status: DC | PRN
  Administered 2018-12-31 (×2): 0.5 mg via INTRAVENOUS

## 2018-12-31 MED ORDER — SCOPOLAMINE 1 MG/3DAYS TD PT72
MEDICATED_PATCH | TRANSDERMAL | Status: AC
  Filled 2018-12-31: qty 1

## 2018-12-31 MED ORDER — PROPOFOL 10 MG/ML IV BOLUS
INTRAVENOUS | Status: DC | PRN
  Administered 2018-12-31: 120 mg via INTRAVENOUS
  Administered 2018-12-31: 80 mg via INTRAVENOUS

## 2018-12-31 MED ORDER — SCOPOLAMINE 1 MG/3DAYS TD PT72
MEDICATED_PATCH | TRANSDERMAL | Status: DC | PRN
  Administered 2018-12-31: 1 via TRANSDERMAL

## 2018-12-31 MED ORDER — PHENYLEPHRINE 40 MCG/ML (10ML) SYRINGE FOR IV PUSH (FOR BLOOD PRESSURE SUPPORT)
PREFILLED_SYRINGE | INTRAVENOUS | Status: AC
  Filled 2018-12-31: qty 20

## 2018-12-31 MED ORDER — LIDOCAINE 2% (20 MG/ML) 5 ML SYRINGE
INTRAMUSCULAR | Status: DC | PRN
  Administered 2018-12-31: 25 mg via INTRAVENOUS
  Administered 2018-12-31: 40 mg via INTRAVENOUS

## 2018-12-31 MED ORDER — OXYCODONE HCL 5 MG PO TABS
5.0000 mg | ORAL_TABLET | Freq: Once | ORAL | Status: AC | PRN
  Administered 2018-12-31: 5 mg via ORAL

## 2018-12-31 MED ORDER — MIDAZOLAM HCL 2 MG/2ML IJ SOLN
INTRAMUSCULAR | Status: AC
  Filled 2018-12-31: qty 2

## 2018-12-31 MED ORDER — FENTANYL CITRATE (PF) 100 MCG/2ML IJ SOLN
INTRAMUSCULAR | Status: AC
  Filled 2018-12-31: qty 2

## 2018-12-31 MED ORDER — SODIUM CHLORIDE 0.9 % IR SOLN
Status: DC | PRN
  Administered 2018-12-31: 6000 mL via INTRAVESICAL

## 2018-12-31 MED ORDER — HYDROMORPHONE HCL 1 MG/ML IJ SOLN
INTRAMUSCULAR | Status: AC
  Filled 2018-12-31: qty 1

## 2018-12-31 MED ORDER — PHENYLEPHRINE 40 MCG/ML (10ML) SYRINGE FOR IV PUSH (FOR BLOOD PRESSURE SUPPORT)
PREFILLED_SYRINGE | INTRAVENOUS | Status: DC | PRN
  Administered 2018-12-31: 80 ug via INTRAVENOUS
  Administered 2018-12-31 (×2): 60 ug via INTRAVENOUS
  Administered 2018-12-31: 80 ug via INTRAVENOUS
  Administered 2018-12-31: 40 ug via INTRAVENOUS
  Administered 2018-12-31: 80 ug via INTRAVENOUS

## 2018-12-31 MED ORDER — PROMETHAZINE HCL 25 MG/ML IJ SOLN
6.2500 mg | INTRAMUSCULAR | Status: DC | PRN
  Administered 2018-12-31: 6.25 mg via INTRAVENOUS

## 2018-12-31 MED ORDER — ESMOLOL HCL 100 MG/10ML IV SOLN
INTRAVENOUS | Status: DC | PRN
  Administered 2018-12-31: 40 mg via INTRAVENOUS
  Administered 2018-12-31 (×2): 30 mg via INTRAVENOUS

## 2018-12-31 MED ORDER — PROPOFOL 10 MG/ML IV BOLUS
INTRAVENOUS | Status: AC
  Filled 2018-12-31: qty 60

## 2018-12-31 MED ORDER — PROMETHAZINE HCL 25 MG/ML IJ SOLN
INTRAMUSCULAR | Status: AC
  Filled 2018-12-31: qty 1

## 2018-12-31 MED ORDER — NITROFURANTOIN MONOHYD MACRO 100 MG PO CAPS: 100.0000 mg | ORAL_CAPSULE | Freq: Every day | ORAL | 0 refills | Status: DC

## 2018-12-31 MED ORDER — DEXAMETHASONE SODIUM PHOSPHATE 10 MG/ML IJ SOLN
INTRAMUSCULAR | Status: DC | PRN
  Administered 2018-12-31: 8 mg via INTRAVENOUS

## 2018-12-31 MED ORDER — DEXMEDETOMIDINE HCL IN NACL 400 MCG/100ML IV SOLN
INTRAVENOUS | Status: DC | PRN
  Administered 2018-12-31: 2 ug via INTRAVENOUS

## 2018-12-31 MED ORDER — SODIUM CHLORIDE 0.9 % IV SOLN
INTRAVENOUS | Status: DC | PRN
  Administered 2018-12-31: 30 mL

## 2018-12-31 MED ORDER — MIDAZOLAM HCL 5 MG/5ML IJ SOLN
INTRAMUSCULAR | Status: DC | PRN
  Administered 2018-12-31: 2 mg via INTRAVENOUS

## 2018-12-31 MED ORDER — OXYCODONE HCL 5 MG PO TABS
ORAL_TABLET | ORAL | Status: AC
  Filled 2018-12-31: qty 1

## 2018-12-31 MED ORDER — FENTANYL CITRATE (PF) 100 MCG/2ML IJ SOLN
INTRAMUSCULAR | Status: DC | PRN
  Administered 2018-12-31: 25 ug via INTRAVENOUS
  Administered 2018-12-31: 50 ug via INTRAVENOUS
  Administered 2018-12-31 (×3): 25 ug via INTRAVENOUS

## 2018-12-31 MED ORDER — CEFAZOLIN SODIUM-DEXTROSE 2-4 GM/100ML-% IV SOLN
2.0000 g | Freq: Once | INTRAVENOUS | Status: AC
  Administered 2018-12-31: 2 g via INTRAVENOUS
  Filled 2018-12-31: qty 100

## 2018-12-31 MED ORDER — ONDANSETRON HCL 4 MG/2ML IJ SOLN
INTRAMUSCULAR | Status: DC | PRN
  Administered 2018-12-31: 4 mg via INTRAVENOUS

## 2018-12-31 MED ORDER — METOPROLOL TARTRATE 5 MG/5ML IV SOLN
INTRAVENOUS | Status: DC | PRN
  Administered 2018-12-31: 1 mg via INTRAVENOUS

## 2018-12-31 MED ORDER — LACTATED RINGERS IV SOLN
INTRAVENOUS | Status: DC
  Administered 2018-12-31 (×2): via INTRAVENOUS

## 2018-12-31 MED ORDER — OXYCODONE HCL 5 MG/5ML PO SOLN: 5.0000 mg | Freq: Once | ORAL | Status: AC | PRN

## 2018-12-31 MED FILL — NITROFURANTOIN MONO-MCR 100: 100 | 30 days supply | Qty: 30 | Fill #0

## 2018-12-31 SURGICAL SUPPLY — 18 items
BAG URO CATCHER STRL LF (MISCELLANEOUS) ×2 IMPLANT
BASKET ZERO TIP NITINOL 2.4FR (BASKET) ×1 IMPLANT
CATH INTERMIT  6FR 70CM (CATHETERS) IMPLANT
CLOTH BEACON ORANGE TIMEOUT ST (SAFETY) ×2 IMPLANT
COVER WAND RF STERILE (DRAPES) IMPLANT
FIBER LASER FLEXIVA 365 (UROLOGICAL SUPPLIES) ×2 IMPLANT
GLOVE BIOGEL M STRL SZ7.5 (GLOVE) ×2 IMPLANT
GOWN STRL REUS W/TWL LRG LVL3 (GOWN DISPOSABLE) ×2 IMPLANT
GOWN STRL REUS W/TWL XL LVL3 (GOWN DISPOSABLE) ×2 IMPLANT
GUIDEWIRE ANG ZIPWIRE 038X150 (WIRE) ×1 IMPLANT
GUIDEWIRE STR DUAL SENSOR (WIRE) ×2 IMPLANT
KIT TURNOVER KIT A (KITS) IMPLANT
MANIFOLD NEPTUNE II (INSTRUMENTS) ×2 IMPLANT
PACK CYSTO (CUSTOM PROCEDURE TRAY) ×2 IMPLANT
SHEATH URETERAL 12FRX28CM (UROLOGICAL SUPPLIES) ×1 IMPLANT
STENT URET 6FRX24 CONTOUR (STENTS) ×1 IMPLANT
TUBING CONNECTING 10 (TUBING) ×2 IMPLANT
TUBING UROLOGY SET (TUBING) IMPLANT

## 2018-12-31 NOTE — Transfer of Care (Signed)
Immediate Anesthesia Transfer of Care Note  Patient: FIORA WEILL  Procedure(s) Performed: CYSTOSCOPY/URETEROSCOPY/HOLMIUM LASER/STENT PLACEMENT;LEFT RETROGRADE PYELOGRAM;STONE BASKET EXTRACTION (Left Ureter)  Patient Location: PACU  Anesthesia Type:General  Level of Consciousness: drowsy, patient cooperative and responds to stimulation  Airway & Oxygen Therapy: Patient Spontanous Breathing and Patient connected to face mask oxygen  Post-op Assessment: Report given to RN and Post -op Vital signs reviewed and stable  Post vital signs: Reviewed and stable  Last Vitals:  Vitals Value Taken Time  BP 112/60 12/31/18 1350  Temp    Pulse 85 12/31/18 1352  Resp 13 12/31/18 1352  SpO2 100 % 12/31/18 1352  Vitals shown include unvalidated device data.  Last Pain:  Vitals:   12/31/18 1022  TempSrc:   PainSc: 3       Patients Stated Pain Goal: 4 (93/26/71 2458)  Complications: No apparent anesthesia complications

## 2018-12-31 NOTE — Op Note (Signed)
Preoperative diagnosis: Left renal stones Postoperative diagnosis: Same  Procedure: Cystoscopy with left ureteroscopy, holmium laser lithotripsy, stone basket extraction, left ureteral stent placement  Surgeon: Junious Silk  Anesthesia: General  Indication for procedure: Jillian Lee is a 43 year old female with a significant stone burden in the left kidney.  A dominant renal pelvic stone measured 17 mm and was quite dense.  There were also a few left lower pole stones and a posterior mid-lower pole stone.  I recommended a PCNL but she favored a noninvasive approach and elected to proceed with a staged ureteroscopy.  Findings: The left renal pelvic stone was fragmented.  Some of the larger fragments went into the upper pole.  There were 2 or 3 left lower pole stones and a posterior mid-lower pole stone. I tried to pull these out of the lower pole and dropped them in the upper pole for ease of fragmentation.  There may be another stone in the left lower pole.  After about an hour of work the mucosa was a little bit oozing from the stone impaction and visualization was more difficult.  I left a stent and will come back and look for a second procedure.  The stones were visible on the scout image.  Left retrograde pyelogram-this outlined a normal ureter with a large filling defect in the renal pelvis and lower pole consistent with the stones.  Description of procedure: After consent was obtained the patient brought to the operating room.  After adequate anesthesia she is placed in lithotomy position and prepped and draped in the usual sterile fashion.  A timeout was performed to confirm the patient and procedure.  The cystoscope was passed per urethra and the bladder inspected.  The urethra and the bladder were unremarkable.  There was no stone or foreign body in the bladder.  The 6 [place] open-ended catheter was used to cannulate the left ureteral orifice.  I passed a sensor wire and then used a ureteral  access sheath to pass a Glidewire.  I went adjacent to the [place] leaving it as the safety wire.  The access sheath went with typical resistance but nothing significant.  The dual channel digital ureteroscope was advanced into the proximal ureter and at the UPJ the tip of the stone was met.  A 365 m laser fiber was used to fragment the stone at 0.4 and 50.  This whittled the laser tip completely off and down to the green covering.  A new 365 m laser was passed in some of the larger fragments in the upper pole were dusted and then I fragmented some of the lower pole but it was a tough angle.  I used a 0 tip basket to drop what I could into the upper pole from the lower pole.  Further fragmentation was done.  There are quite a few fragments remaining possibly an intact lower pole stone.  The access sheath was backed out on the ureteroscope in the collecting system renal pelvis and ureter were inspected again.  There was noted to be no injury, stone fragment or stricture of the collecting system or ureter.  The Glidewire was backloaded on the cystoscope and a 6 x 24 cm stent advanced.  The wire was removed with a good coil seen in the upper pole calyx and a good coil in the bladder.  The bladder was drained and the scope removed.  She was awakened taken recovery room in stable condition.  Complications: None  Blood loss: Minimal  Specimens: None  Drains: 6 x 24 cm left ureteral stent  Disposition: Patient stable to PACU

## 2018-12-31 NOTE — Interval H&P Note (Signed)
History and Physical Interval Note:  12/31/2018 12:13 PM  Jillian Lee  has presented today for surgery, with the diagnosis of LEFT RENAL STONE.  The various methods of treatment have been discussed with the patient and family. After consideration of risks, benefits and other options for treatment, the patient has consented to  Procedure(s): CYSTOSCOPY/URETEROSCOPY/HOLMIUM LASER/STENT PLACEMENT (Left) as a surgical intervention.  The patient's history has been reviewed, patient examined, no change in status, stable for surgery.  I have reviewed the patient's chart and labs. Discussed again need for staged procedure, living and working with a stent, etc. Questions were answered to the patient's satisfaction.  She is well with no fever or dysuria.    Festus Aloe

## 2018-12-31 NOTE — Anesthesia Procedure Notes (Signed)
Procedure Name: LMA Insertion Date/Time: 12/31/2018 12:23 PM Performed by: Silas [place], CRNA Pre-anesthesia Checklist: Patient identified, Emergency Drugs available, Suction available and Patient being monitored Patient Re-evaluated:Patient Re-evaluated prior to induction Oxygen Delivery Method: Circle system utilized Preoxygenation: Pre-oxygenation with 100% oxygen Induction Type: IV induction LMA: LMA inserted LMA Size: 3.0 Tube type: Oral Number of attempts: 1 Airway Equipment and Method: Stylet and Oral airway Placement Confirmation: ETT inserted through vocal cords under direct vision,  positive ETCO2 and breath sounds checked- equal and bilateral Tube secured with: Tape Dental Injury: Teeth and Oropharynx as per pre-operative assessment

## 2018-12-31 NOTE — Discharge Instructions (Signed)
Kidney Stones  Kidney stones (urolithiasis) are rock-like masses that form inside of the kidneys. Kidneys are organs that make pee (urine). A kidney stone can cause very bad pain and can block the flow of pee. The stone usually leaves your body (passes) through your pee. You may need to have a doctor take out the stone. Follow these instructions at home: Eating and drinking  Drink enough fluid to keep your pee clear or pale yellow. This will help you pass the stone.  If told by your doctor, change the foods you eat (your diet). This may include: ? Limiting how much salt (sodium) you eat. ? Eating more fruits and vegetables. ? Limiting how much meat, poultry, fish, and eggs you eat.  Follow instructions from your doctor about eating or drinking restrictions. General instructions  Collect pee samples as told by your doctor. You may need to collect a pee sample: ? 24 hours after a stone comes out. ? 8-12 weeks after a stone comes out, and every 6-12 months after that.  Strain your pee every time you pee (urinate), for as long as told. Use the strainer that your doctor recommends.  Do not throw out the stone. Keep it so that it can be tested by your doctor.  Take over-the-counter and prescription medicines only as told by your doctor.  Keep all follow-up visits as told by your doctor. This is important. You may need follow-up tests. Preventing kidney stones To prevent another kidney stone:  Drink enough fluid to keep your pee clear or pale yellow. This is the best way to prevent kidney stones.  Eat healthy foods.  Avoid certain foods as told by your doctor. You may be told to eat less protein.  Stay at a healthy weight. Contact a doctor if:  You have pain that gets worse or does not get better with medicine. Get help right away if:  You have a fever or chills.  You get very bad pain.  You get new pain in your belly (abdomen).  You pass out (faint).  You cannot  pee. This information is not intended to replace advice given to you by your health care provider. Make sure you discuss any questions you have with your health care provider. Document Released: 12/13/2007 Document Revised: 03/14/2016 Document Reviewed: 03/14/2016 Elsevier Interactive Patient Education  2019 [employer] Ureteral Stent Implantation, Care After Refer to this sheet in the next few weeks. These instructions provide you with information about caring for yourself after your procedure. Your health care provider may also give you more specific instructions. Your treatment has been planned according to current medical practices, but problems sometimes occur. Call your health care provider if you have any problems or questions after your procedure. What can I expect after the procedure? After the procedure, it is common to have:  Nausea.  Mild pain when you urinate. You may feel this pain in your lower back or lower abdomen. Pain should stop within a few minutes after you urinate. This may last for up to 1 week.  A small amount of blood in your urine for several days. Follow these instructions at home:  Medicines  Take over-the-counter and prescription medicines only as told by your health care provider.  If you were prescribed an antibiotic medicine, take it as told by your health care provider. Do not stop taking the antibiotic even if you start to feel better.  Do not drive for 24 hours if you received a sedative.  Do not drive or operate heavy machinery while taking prescription pain medicines. Activity  Return to your normal activities as told by your health care provider. Ask your health care provider what activities are safe for you.  Do not lift anything that is heavier than 10 lb (4.5 kg). Follow this limit for 1 week after your procedure, or for as long as told by your health care provider. General instructions  Watch for any blood in your urine. Call your health  care provider if the amount of blood in your urine increases.  If you have a catheter: ? Follow instructions from your health care provider about taking care of your catheter and collection bag. ? Do not take baths, swim, or use a hot tub until your health care provider approves.  Drink enough fluid to keep your urine clear or pale yellow.  Keep all follow-up visits as told by your health care provider. This is important. Contact a health care provider if:  You have pain that gets worse or does not get better with medicine, especially pain when you urinate.  You have difficulty urinating.  You feel nauseous or you vomit repeatedly during a period of more than 2 days after the procedure. Get help right away if:  Your urine is dark red or has blood clots in it.  You are leaking urine (have incontinence).  The end of the stent comes out of your urethra.  You cannot urinate.  You have sudden, sharp, or severe pain in your abdomen or lower back.  You have a fever. This information is not intended to replace advice given to you by your health care provider. Make sure you discuss any questions you have with your health care provider. Document Released: 02/26/2013 Document Revised: 12/02/2015 Document Reviewed: 01/08/2015 Elsevier Interactive Patient Education  2019 [employer].

## 2018-12-31 NOTE — Anesthesia Preprocedure Evaluation (Signed)
Anesthesia Evaluation  Patient identified by MRN, date of birth, ID band Patient awake    Reviewed: Allergy & Precautions, NPO status , Patient's Chart, lab work & pertinent test results  Airway Mallampati: II  TM Distance: >3 FB Neck ROM: Full    Dental no notable dental hx.    Pulmonary neg pulmonary ROS,    Pulmonary exam normal breath sounds clear to auscultation       Cardiovascular hypertension, negative cardio ROS Normal cardiovascular exam Rhythm:Regular Rate:Normal     Neuro/Psych negative neurological ROS  negative psych ROS   GI/Hepatic Neg liver ROS, GERD  ,  Endo/Other  Hypothyroidism   Renal/GU Renal disease  negative genitourinary   Musculoskeletal negative musculoskeletal ROS (+)   Abdominal   Peds negative pediatric ROS (+)  Hematology negative hematology ROS (+)   Anesthesia Other Findings   Reproductive/Obstetrics negative OB ROS                             Anesthesia Physical Anesthesia Plan  ASA: II  Anesthesia Plan: General   Post-op Pain Management:    Induction: Intravenous  PONV Risk Score and Plan: 3 and Ondansetron, Dexamethasone, Midazolam and Treatment may vary due to age or medical condition  Airway Management Planned: LMA  Additional Equipment:   Intra-op Plan:   Post-operative Plan: Extubation in OR  Informed Consent: I have reviewed the patients History and Physical, chart, labs and discussed the procedure including the risks, benefits and alternatives for the proposed anesthesia with the patient or authorized representative who has indicated his/her understanding and acceptance.     Dental advisory given  Plan Discussed with: CRNA  Anesthesia Plan Comments:         Anesthesia Quick Evaluation

## 2018-12-31 NOTE — Anesthesia Postprocedure Evaluation (Signed)
Anesthesia Post Note  Patient: AZULA ZAPPIA  Procedure(s) Performed: CYSTOSCOPY/URETEROSCOPY/HOLMIUM LASER/STENT PLACEMENT;LEFT RETROGRADE PYELOGRAM;STONE BASKET EXTRACTION (Left Ureter)     Patient location during evaluation: PACU Anesthesia Type: General Level of consciousness: awake and alert Pain management: pain level controlled Vital Signs Assessment: post-procedure vital signs reviewed and stable Respiratory status: spontaneous breathing, nonlabored ventilation and respiratory function stable Cardiovascular status: blood pressure returned to baseline and stable Postop Assessment: no apparent nausea or vomiting Anesthetic complications: no    Last Vitals:  Vitals:   12/31/18 1455 12/31/18 1515  BP: 133/73 128/75  Pulse: 86 90  Resp: 14 16  Temp: 36.4 C 36.5 C  SpO2: 93% 95%    Last Pain:  Vitals:   12/31/18 1515  TempSrc:   PainSc: [place]

## 2019-01-01 ENCOUNTER — Encounter (HOSPITAL_COMMUNITY): Payer: Self-pay | Admitting: Urology

## 2019-01-01 ENCOUNTER — Telehealth: Payer: Self-pay

## 2019-01-01 NOTE — Telephone Encounter (Signed)
Pt left a vm msg requesting a call back from provider. She stated that the information on the paperwork was noted incorrect. She wants to speak with provider directly to discuss the matter. Pls advise, thanks.

## 2019-01-02 NOTE — Telephone Encounter (Signed)
Task completed. I have contacted the pt with an update regarding provider's note. Aware to contact Dr. Festus Aloe (urologist specialist)  for further inquiries regarding FMLA paperwork. No other inquiries during call.

## 2019-01-02 NOTE — Telephone Encounter (Signed)
I did get a note which asked about return to work date, which I was not able to estimate because her surgeon should be the person to determine that. I faxed the forms back asking them to contact her urologist. If this is the issue she's asking about, her FMLA coordinator needs to contact her surgeon.

## 2019-01-06 DIAGNOSIS — N200 Calculus of kidney: Secondary | ICD-10-CM | POA: Diagnosis not present

## 2019-01-06 MED FILL — ONDANSETRON HCL 4 MG TABLET: 4 | 7 days supply | Qty: 20 | Fill #0

## 2019-01-06 MED FILL — HYDROCODON-APAP 5-325: 5-325 | 5 days supply | Qty: 20 | Fill #0

## 2019-01-06 MED FILL — OXYBUTYNIN 5 MG TABLET: 5 | 10 days supply | Qty: 30 | Fill #0

## 2019-01-06 MED FILL — KETOROLAC 10 MG TABLET: 10 | 5 days supply | Qty: 20 | Fill #0

## 2019-01-07 ENCOUNTER — Other Ambulatory Visit: Payer: Self-pay | Admitting: Urology

## 2019-01-08 ENCOUNTER — Encounter: Payer: Self-pay | Admitting: Emergency Medicine

## 2019-01-08 ENCOUNTER — Emergency Department (INDEPENDENT_AMBULATORY_CARE_PROVIDER_SITE_OTHER): Payer: 59

## 2019-01-08 ENCOUNTER — Emergency Department
Admission: EM | Admit: 2019-01-08 | Discharge: 2019-01-08 | Disposition: A | Discharge status: Home / Self Care | Payer: 59 | Source: Home / Self Care | Attending: Family Medicine | Admitting: Family Medicine

## 2019-01-08 ENCOUNTER — Other Ambulatory Visit: Payer: Self-pay

## 2019-01-08 DIAGNOSIS — R0602 Shortness of breath: Secondary | ICD-10-CM | POA: Diagnosis not present

## 2019-01-08 DIAGNOSIS — Z8659 Personal history of other mental and behavioral disorders: Secondary | ICD-10-CM

## 2019-01-08 DIAGNOSIS — R079 Chest pain, unspecified: Secondary | ICD-10-CM | POA: Diagnosis not present

## 2019-01-08 DIAGNOSIS — R0789 Other chest pain: Secondary | ICD-10-CM

## 2019-01-08 NOTE — ED Triage Notes (Signed)
Patient had lithotripsy yesterday; had injections before being discharged; felt some mild chest discomfort on way home but it resolved; today on way to work again felt SOB; coughed once and it was productive; no fever. 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

## 2019-01-08 NOTE — ED Provider Notes (Signed)
[name]    CSN: [id number] Arrival date & time: 01/08/19  0805     History   Chief Complaint Chief Complaint  Patient presents with  . Shortness of Breath    HPI Jillian Lee is a 43 y.o. female.   HPI  Jillian Lee is a 43 y.o. female presenting to UC with c/o intermittent chest tightness and shortness of breath since Monday, 01/06/2019.  Pt had lithotripsy last week on 12/31/2018.  She continued to have pain from the surgery despite taking previously prescribed oxycodone but she was not having any SOB.  She was called into the office on Monday and given an injection of Ketoralac, monitored for 15 minutes in the clinic, then discharged after denying having any side effects from the injection. On the way home, she felt mild chest tightness and SOB that eventually resolved on its own.  Yesterday, she took one tab of Ketoralac 16m at [place] She went to work for the first time in over 1 month.  Pt works in house keeping at the hospital.  She does have bilateral calf pain that is mildly aching and sore, she figures from bending down and climbing stairs. Denies redness or swelling in her legs. Denies hx of clots.  Pt took her oxycodone last night before bed, like she has many times in the past. She woke at 3AM short of breath and had a coughing episode where she coughed up some phlegm.  She has not coughed since then but still has mild chest tightness and feels mildly SOB. Denies fever, chills, n/v/d. No recent travel or sick contacts. Pt did test negative for Covid-19 on 12/28/2018 as her pre-op workup.  She has another Covid-19 test ordered yesterday, likely for upcoming lithotripsy on July 21st.  She has not taken any medications today.   Past Medical History:  Diagnosis Date  . Anemia   . GERD (gastroesophageal reflux disease)   . History of kidney stones   . HSV (herpes simplex virus) infection   . Hypercholesterolemia   . Hypertensive urgency 03/26/2015  .  Hypothyroidism 03/31/2015  . Ingrown right big toenail 1998  . Pre-diabetes    patient does not know anything about this    Patient Active Problem List   Diagnosis Date Noted  . Weight gain 11/02/2017  . HSV infection 08/16/2016  . Prediabetes 04/21/2016  . GERD (gastroesophageal reflux disease) 12/01/2015  . Hyperlipidemia 06/09/2015  . Essential hypertension, benign 03/31/2015  . Hypothyroidism 03/31/2015  . Calculus of kidney 12/20/2013    Past Surgical History:  Procedure Laterality Date  . CYSTOSCOPY/URETEROSCOPY/HOLMIUM LASER/STENT PLACEMENT Left 12/31/2018   Procedure: CYSTOSCOPY/URETEROSCOPY/HOLMIUM LASER/STENT PLACEMENT;LEFT RETROGRADE PYELOGRAM;STONE BASKET EXTRACTION;  Surgeon: EFestus Aloe MD;  Location: [facility];  Service: Urology;  Laterality: Left;  . TOENAIL AVULSION      OB History   No obstetric history on file.      Home Medications    Prior to Admission medications   Medication Sig Start Date End Date Taking? Authorizing Provider  Ascorbic Acid (VITAMIN C) 1000 MG tablet Take 1,000 mg by mouth daily with lunch. W/Rose Hips    [provider]  aspirin 81 MG chewable tablet Chew 1 tablet (81 mg total) by mouth daily. 04/25/17   AEmeterio Reeve DO  atorvastatin (LIPITOR) 20 MG tablet Take 1 tablet (20 mg total) by mouth daily. 07/23/18   AEmeterio Reeve DO  cetirizine (ZYRTEC) 10 MG tablet Take 10 mg by mouth daily as needed  for allergies.     [provider]  [employer] MAXIMUM REDNESS RELIEF 0.03-0.5 % SOLN Place 1 drop into both eyes 3 (three) times daily.    [provider]  ferrous sulfate 325 (65 FE) MG tablet Take 1 tablet (325 mg total) by mouth daily with breakfast. Patient taking differently: Take 325 mg by mouth daily with lunch.  04/25/17   Emeterio Reeve, DO  fluticasone [facility]) 50 MCG/ACT nasal spray One spray in each nostril twice a day, use left hand for right nostril, and right hand for left nostril.  Patient taking differently: Place 1-2 sprays into both nostrils daily as needed for allergies.  06/09/16   Silverio Decamp, MD  HYDROcodone-acetaminophen (NORCO/VICODIN) 5-325 MG tablet Take 1 tablet by mouth 2 (two) times a day.    [provider]  ipratropium (ATROVENT) 0.06 % nasal spray Place 2 sprays into both nostrils 4 (four) times daily. As needed for sinus congestion Patient taking differently: Place 2 sprays into both nostrils 4 (four) times daily as needed (sinus congestion.).  06/28/18   Emeterio Reeve, DO  levonorgestrel-ethinyl estradiol (AVIANE,ALESSE,LESSINA) 0.1-20 MG-MCG tablet TAKE 1 TABLET BY MOUTH DAILY. Patient taking differently: Take 1 tablet by mouth at bedtime.  03/28/18   Emeterio Reeve, DO  levothyroxine (SYNTHROID, LEVOTHROID) 50 MCG tablet Take 1 tablet (50 mcg total) by mouth daily before breakfast. 07/29/18   Emeterio Reeve, DO  lisinopril-hydrochlorothiazide (PRINZIDE,ZESTORETIC) 20-25 MG tablet Take 1 tablet by mouth daily. 06/28/18   Emeterio Reeve, DO  Multiple Vitamins-Minerals (MULTIVITAMIN WITH MINERALS) tablet Take 1 tablet by mouth daily with lunch.     [provider]  nitrofurantoin, macrocrystal-monohydrate, (MACROBID) 100 MG capsule Take 1 capsule (100 mg total) by mouth at bedtime. 12/31/18   Festus Aloe, MD  nitroGLYCERIN (NITROSTAT) 0.3 MG SL tablet Place 1 tablet (0.3 mg total) under the tongue every 5 (five) minutes as needed for chest pain. 03/26/15   Emeterio Reeve, DO  pantoprazole (PROTONIX) 40 MG tablet Take 1 tablet (40 mg total) by mouth 2 (two) times daily. For 8 weeks 12/04/18   Emeterio Reeve, DO  sucralfate (CARAFATE) 1 g tablet Take 1 tablet (1 g total) by mouth 4 (four) times daily for 30 days. Patient not taking: Reported on 12/27/2018 12/04/18 01/03/19  Emeterio Reeve, DO  tamsulosin (FLOMAX) 0.4 MG CAPS capsule Take 1 capsule (0.4 mg total) by mouth daily. Until passage of stone, max use  4 weeks 12/16/18   Emeterio Reeve, DO  valACYclovir (VALTREX) 500 MG tablet TAKE 1 TABLET BY MOUTH DAILY. Patient taking differently: Take 500 mg by mouth at bedtime.  07/16/18   Emeterio Reeve, DO    Family History Family History  Problem Relation Age of Onset  . Anxiety disorder Mother   . Hypertension Mother   . Dementia Mother   . Alcohol abuse Mother   . Depression Mother   . Stroke Mother   . Alcohol abuse Maternal Uncle   . Diabetes Maternal Uncle   . Hypertension Maternal Uncle   . Cancer Maternal Grandmother   . Diabetes Maternal Grandmother     Social History Social History   Tobacco Use  . Smoking status: Never Smoker  . Smokeless tobacco: Never Used  Substance Use Topics  . Alcohol use: No  . Drug use: No     Allergies   Clarithromycin   Review of Systems Review of Systems  Constitutional: Negative for chills and fever.  HENT: Negative for congestion, postnasal drip and  sore throat.   Respiratory: Positive for chest tightness and shortness of breath. Negative for wheezing and stridor.   Cardiovascular: Negative for chest pain, palpitations and leg swelling.  Gastrointestinal: Negative for abdominal pain, nausea and vomiting.  Genitourinary: Positive for flank pain (chronic from stones). Negative for dysuria and frequency.  Musculoskeletal: Positive for myalgias. Negative for arthralgias.  Neurological: Negative for dizziness, syncope, weakness, light-headedness, numbness and headaches.     Physical Exam Triage Vital Signs ED Triage Vitals  Enc Vitals Group     BP 120/76     Pulse 79     Resp 18     Temp 98.4     Temp src oral     SpO2 100%     Weight      Height      Head Circumference      Peak Flow      Pain Score      Pain Loc      Pain Edu?      Excl. in [place]?    No data found.  Updated Vital Signs BP 120/76 (BP Location: Right Arm)   Pulse 89 Comment: ambulating  Temp 98.4 F (36.9 C) (Oral)   Resp 18   Ht 4' 9"  (1.448 m)    Wt 100 lb (45.4 kg)   LMP 12/26/2018 (Exact Date)   SpO2 98% Comment: ambulating  BMI 21.64 kg/m       Physical Exam Vitals signs and nursing note reviewed.  Constitutional:      Appearance: She is well-developed.     Comments: Pt sitting comfortably on exam table, NAD.  HENT:     Head: Normocephalic and atraumatic.  Neck:     Musculoskeletal: Normal range of motion.  Cardiovascular:     Rate and Rhythm: Normal rate and regular rhythm.  Pulmonary:     Effort: Pulmonary effort is normal.     Breath sounds: Normal breath sounds. No decreased breath sounds, wheezing, rhonchi or rales.  Musculoskeletal: Normal range of motion.  Skin:    General: Skin is warm and dry.  Neurological:     Mental Status: She is alert and oriented to person, place, and time.  Psychiatric:        Behavior: Behavior normal.      UC Treatments / Results  Labs (all labs ordered are listed, but only abnormal results are displayed) Labs Reviewed - No data to display  EKG   Radiology Dg Chest 2 View  Result Date: 01/08/2019 CLINICAL DATA:  Chest pain and shortness of breath EXAM: CHEST - 2 VIEW COMPARISON:  November 06, 2012 FINDINGS: Lungs are clear. Heart size and pulmonary vascularity are normal. No adenopathy. No pneumothorax. No bone lesions. There is an apparent double-J stent on the left, incompletely visualized. IMPRESSION: No edema or consolidation. Electronically Signed   By: Lowella Grip III M.D.   On: 01/08/2019 08:56    Procedures Procedures (including critical care time)  Medications Ordered in UC Medications - No data to display  Initial Impression / Assessment and Plan / UC Course  I have reviewed the triage vital signs and the nursing notes.  Pertinent labs & imaging results that were available during my care of the patient were reviewed by me and considered in my medical decision making (see chart for details).     Reassured pt of normal CXR and vitals including  ambulatory vitals with HR of 89 and O2Sat on RA 98% Pt denies dizziness or SOB  while walking. Doubt PE. Pt denies chest pain, doubt ACS No evidence of emergent process taking place at this time.    Encouraged f/u with her PCP and urologist to discuss symptoms and medications.  Discussed symptoms that warrant emergent care in the ED.   Final Clinical Impressions(s) / UC Diagnoses   Final diagnoses:  Feeling of chest tightness  Shortness of breath  History of panic attacks     Discharge Instructions      The medication you were given on Monday and took yesterday morning at 8AM is likely not the cause of your shortness of breath given when you took the medication and when your symptoms started.  Oxycodone is more likely to cause shortness of breath, fatigue, and chest tightness. Use caution when taking this narcotic pain medication.  In the meantime, you may take other over the counter antiinflammatory medications such as Motrin and Advil (ibuprofen) if you have done well with these in the past.     It is advised that you call your family doctor today or send her a MyChart message to schedule a follow up visit tomorrow or Friday for recheck of your symptoms and to discuss your medications.  Call 911 or go to the closest hospital if symptoms keep returning, especially if worsening.     ED Prescriptions    None     Controlled Substance Prescriptions [place] Controlled Substance Registry consulted? Not Applicable   Tyrell Antonio 01/08/19 [id number]

## 2019-01-08 NOTE — Discharge Instructions (Signed)
°  The medication you were given on Monday and took yesterday morning at 8AM is likely not the cause of your shortness of breath given when you took the medication and when your symptoms started.  Oxycodone is more likely to cause shortness of breath, fatigue, and chest tightness. Use caution when taking this narcotic pain medication.  In the meantime, you may take other over the counter antiinflammatory medications such as Motrin and Advil (ibuprofen) if you have done well with these in the past.     It is advised that you call your family doctor today or send her a MyChart message to schedule a follow up visit tomorrow or Friday for recheck of your symptoms and to discuss your medications.  Call 911 or go to the closest hospital if symptoms keep returning, especially if worsening.

## 2019-01-09 ENCOUNTER — Telehealth: Payer: Self-pay

## 2019-01-09 NOTE — Telephone Encounter (Signed)
Spoke with patient this am, still having muscle aches, still having SOB, but better.  Has appointment with PCP early next week.

## 2019-01-13 ENCOUNTER — Other Ambulatory Visit: Payer: Self-pay | Admitting: Osteopathic Medicine

## 2019-01-13 MED FILL — LISINOPRIL-HCTZ 20-25 MG TA: 20-25 | 90 days supply | Qty: 90 | Fill #0

## 2019-01-13 NOTE — Telephone Encounter (Signed)
[facility] requesting med refill for tamsulosin. Rx was short term. Pls advise, thanks.

## 2019-01-13 NOTE — Telephone Encounter (Signed)
Noted  

## 2019-01-13 NOTE — Telephone Encounter (Signed)
Pharmacy should contact her surgeon at [facility]

## 2019-01-15 ENCOUNTER — Encounter: Payer: Self-pay | Admitting: Osteopathic Medicine

## 2019-01-15 ENCOUNTER — Ambulatory Visit (INDEPENDENT_AMBULATORY_CARE_PROVIDER_SITE_OTHER): Payer: 59 | Admitting: Osteopathic Medicine

## 2019-01-15 ENCOUNTER — Other Ambulatory Visit: Payer: Self-pay

## 2019-01-15 DIAGNOSIS — R0789 Other chest pain: Secondary | ICD-10-CM | POA: Diagnosis not present

## 2019-01-15 DIAGNOSIS — R0989 Other specified symptoms and signs involving the circulatory and respiratory systems: Secondary | ICD-10-CM | POA: Diagnosis not present

## 2019-01-15 DIAGNOSIS — J989 Respiratory disorder, unspecified: Secondary | ICD-10-CM

## 2019-01-15 MED ORDER — BUDESONIDE-FORMOTEROL FUMARATE 160-4.5 MCG/ACT IN AERO: 2.0000 | INHALATION_SPRAY | Freq: Two times a day (BID) | RESPIRATORY_TRACT | 1 refills | Status: DC

## 2019-01-15 MED FILL — SYMBICORT 160-4.5 MCG INH: 160-4.5 | 30 days supply | Qty: 10 | Fill #0

## 2019-01-15 NOTE — Progress Notes (Signed)
Virtual Visit via Video (App used: Doximity) Note  I connected with      Jillian Lee on 01/15/19 at 1:00 PM by a telemedicine application and verified that I am speaking with the correct person using two identifiers.  Patient is at home I am in office   I discussed the limitations of evaluation and management by telemedicine and the availability of in person appointments. The patient expressed understanding and agreed to proceed.  History of Present Illness: Jillian Lee is a 43 y.o. female who would like to discuss chest tightness    Deep breathing causes chest tightness. Has been going on since 01/08/2019. Hurts to breathe in deep. Xray in UC was ok. Midline chest tightness. Some stuffy nose but that started after the cough. GERD is much better on the PPI. She has been painting and remodeling her house. She seems to have symptoms improve at work and get worse at home. No CP/SOB on exertion. Has stayed about same level of severity past week or so.       Observations/Objective: LMP 12/26/2018 (Exact Date)  BP Readings from Last 3 Encounters:  01/08/19 120/76  12/31/18 128/75  12/30/18 126/72   Exam: Normal Speech.  NAD  Lab and Radiology Results No results found for this or any previous visit (from the past 72 hour(s)). No results found.     Assessment and Plan: 43 y.o. female with The encounter diagnosis was Reactive airway disease that is not asthma.  Seems more like some kind of respiratory irritant as opposed to viral or other issue. Gets better at work and worse at home, she is remodeling house. Let's try inhaler and see if this helps. RTC if no improvement.   PDMP not reviewed this encounter. No orders of the defined types were placed in this encounter.  Meds ordered this encounter  Medications  . budesonide-formoterol (SYMBICORT) 160-4.5 MCG/ACT inhaler    Sig: Inhale 2 puffs into the lungs 2 (two) times daily.    Dispense:  1 Inhaler   Refill:  1      Follow Up Instructions: Return if symptoms worsen or fail to improve.    I discussed the assessment and treatment plan with the patient. The patient was provided an opportunity to ask questions and all were answered. The patient agreed with the plan and demonstrated an understanding of the instructions.   The patient was advised to call back or seek an in-person evaluation if any new concerns, if symptoms worsen or if the condition fails to improve as anticipated.  25 minutes of non-face-to-face time was provided during this encounter.                      Historical information moved to improve visibility of documentation.  Past Medical History:  Diagnosis Date  . Anemia   . GERD (gastroesophageal reflux disease)   . History of kidney stones   . HSV (herpes simplex virus) infection   . Hypercholesterolemia   . Hypertensive urgency 03/26/2015  . Hypothyroidism 03/31/2015  . Ingrown right big toenail 1998  . Pre-diabetes    patient does not know anything about this   Past Surgical History:  Procedure Laterality Date  . CYSTOSCOPY/URETEROSCOPY/HOLMIUM LASER/STENT PLACEMENT Left 12/31/2018   Procedure: CYSTOSCOPY/URETEROSCOPY/HOLMIUM LASER/STENT PLACEMENT;LEFT RETROGRADE PYELOGRAM;STONE BASKET EXTRACTION;  Surgeon: Festus Aloe, MD;  Location: [facility];  Service: Urology;  Laterality: Left;  . TOENAIL AVULSION     Social History   Tobacco  Use  . Smoking status: Never Smoker  . Smokeless tobacco: Never Used  Substance Use Topics  . Alcohol use: No   family history includes Alcohol abuse in her maternal uncle and mother; Anxiety disorder in her mother; Cancer in her maternal grandmother; Dementia in her mother; Depression in her mother; Diabetes in her maternal grandmother and maternal uncle; Hypertension in her maternal uncle and mother; Stroke in her mother.  Medications: Current Outpatient Medications  Medication Sig Dispense Refill  .  Ascorbic Acid (VITAMIN C) 1000 MG tablet Take 1,000 mg by mouth daily with lunch. W/Rose Hips    . aspirin 81 MG chewable tablet Chew 1 tablet (81 mg total) by mouth daily. 90 tablet 3  . atorvastatin (LIPITOR) 20 MG tablet Take 1 tablet (20 mg total) by mouth daily. 90 tablet 3  . budesonide-formoterol (SYMBICORT) 160-4.5 MCG/ACT inhaler Inhale 2 puffs into the lungs 2 (two) times daily. 1 Inhaler 1  . cetirizine (ZYRTEC) 10 MG tablet Take 10 mg by mouth daily as needed for allergies.     . [employer] MAXIMUM REDNESS RELIEF 0.03-0.5 % SOLN Place 1 drop into both eyes 3 (three) times daily.    . ferrous sulfate 325 (65 FE) MG tablet Take 1 tablet (325 mg total) by mouth daily with breakfast. (Patient taking differently: Take 325 mg by mouth daily with lunch. ) 90 tablet 3  . fluticasone (FLONASE) 50 MCG/ACT nasal spray One spray in each nostril twice a day, use left hand for right nostril, and right hand for left nostril. (Patient taking differently: Place 1-2 sprays into both nostrils daily as needed for allergies. ) 48 g 3  . HYDROcodone-acetaminophen (NORCO/VICODIN) 5-325 MG tablet Take 1 tablet by mouth 2 (two) times a day.    . ipratropium (ATROVENT) 0.06 % nasal spray Place 2 sprays into both nostrils 4 (four) times daily. As needed for sinus congestion (Patient taking differently: Place 2 sprays into both nostrils 4 (four) times daily as needed (sinus congestion.). ) 15 mL 1  . levonorgestrel-ethinyl estradiol (AVIANE,ALESSE,LESSINA) 0.1-20 MG-MCG tablet TAKE 1 TABLET BY MOUTH DAILY. (Patient taking differently: Take 1 tablet by mouth at bedtime. ) 84 tablet 3  . levothyroxine (SYNTHROID, LEVOTHROID) 50 MCG tablet Take 1 tablet (50 mcg total) by mouth daily before breakfast. 30 tablet 5  . lisinopril-hydrochlorothiazide (PRINZIDE,ZESTORETIC) 20-25 MG tablet Take 1 tablet by mouth daily. 90 tablet 3  . Multiple Vitamins-Minerals (MULTIVITAMIN WITH MINERALS) tablet Take 1 tablet by mouth daily with lunch.      . nitrofurantoin, macrocrystal-monohydrate, (MACROBID) 100 MG capsule Take 1 capsule (100 mg total) by mouth at bedtime. (Patient not taking: Reported on 01/15/2019) 30 capsule 0  . nitroGLYCERIN (NITROSTAT) 0.3 MG SL tablet Place 1 tablet (0.3 mg total) under the tongue every 5 (five) minutes as needed for chest pain. 15 tablet 12  . pantoprazole (PROTONIX) 40 MG tablet Take 1 tablet (40 mg total) by mouth 2 (two) times daily. For 8 weeks 120 tablet 0  . sucralfate (CARAFATE) 1 g tablet Take 1 tablet (1 g total) by mouth 4 (four) times daily for 30 days. (Patient not taking: Reported on 12/27/2018) 120 tablet 0  . tamsulosin (FLOMAX) 0.4 MG CAPS capsule Take 1 capsule (0.4 mg total) by mouth daily. Until passage of stone, max use 4 weeks 30 capsule 0  . valACYclovir (VALTREX) 500 MG tablet TAKE 1 TABLET BY MOUTH DAILY. (Patient taking differently: Take 500 mg by mouth at bedtime. ) 90 tablet 1  No current facility-administered medications for this visit.    Allergies  Allergen Reactions  . Clarithromycin Other (See Comments)    Doesn't feel well/ body aches     PDMP not reviewed this encounter. No orders of the defined types were placed in this encounter.  Meds ordered this encounter  Medications  . budesonide-formoterol (SYMBICORT) 160-4.5 MCG/ACT inhaler    Sig: Inhale 2 puffs into the lungs 2 (two) times daily.    Dispense:  1 Inhaler    Refill:  1

## 2019-01-20 MED FILL — ATORVASTATIN 20 MG TABLET: 20 | 90 days supply | Qty: 90 | Fill #0

## 2019-01-24 ENCOUNTER — Other Ambulatory Visit (HOSPITAL_COMMUNITY)
Admission: RE | Admit: 2019-01-24 | Discharge: 2019-01-24 | Disposition: A | Discharge status: Home / Self Care | Payer: 59 | Source: Ambulatory Visit | Attending: Urology | Admitting: Urology

## 2019-01-24 DIAGNOSIS — Z1159 Encounter for screening for other viral diseases: Secondary | ICD-10-CM | POA: Diagnosis not present

## 2019-01-24 LAB — SARS CORONAVIRUS 2 (TAT 6-24 HRS): SARS Coronavirus 2: NEGATIVE

## 2019-01-27 ENCOUNTER — Encounter (HOSPITAL_BASED_OUTPATIENT_CLINIC_OR_DEPARTMENT_OTHER): Payer: Self-pay | Admitting: *Deleted

## 2019-01-27 ENCOUNTER — Other Ambulatory Visit: Payer: Self-pay

## 2019-01-27 NOTE — Anesthesia Preprocedure Evaluation (Addendum)
Anesthesia Evaluation  Patient identified by MRN, date of birth, ID band Patient awake    Reviewed: Allergy & Precautions, NPO status , Patient's Chart, lab work & pertinent test results  Airway Mallampati: I  TM Distance: >3 FB Neck ROM: Full    Dental no notable dental hx. (+) Teeth Intact, Dental Advisory Given   Pulmonary neg pulmonary ROS,    Pulmonary exam normal breath sounds clear to auscultation       Cardiovascular hypertension, Pt. on medications negative cardio ROS Normal cardiovascular exam Rhythm:Regular Rate:Normal     Neuro/Psych negative neurological ROS  negative psych ROS   GI/Hepatic Neg liver ROS, GERD  Medicated,  Endo/Other  Hypothyroidism   Renal/GU Renal disease  negative genitourinary   Musculoskeletal negative musculoskeletal ROS (+)   Abdominal   Peds negative pediatric ROS (+)  Hematology  (+) Blood dyscrasia, anemia ,   Anesthesia Other Findings   Reproductive/Obstetrics negative OB ROS                            Anesthesia Physical  Anesthesia Plan  ASA: III  Anesthesia Plan: General   Post-op Pain Management:    Induction: Intravenous  PONV Risk Score and Plan: 3 and Ondansetron, Dexamethasone, Midazolam and Treatment may vary due to age or medical condition  Airway Management Planned: LMA and Oral ETT  Additional Equipment:   Intra-op Plan:   Post-operative Plan: Extubation in OR  Informed Consent: I have reviewed the patients History and Physical, chart, labs and discussed the procedure including the risks, benefits and alternatives for the proposed anesthesia with the patient or authorized representative who has indicated his/her understanding and acceptance.     Dental advisory given  Plan Discussed with: CRNA, Anesthesiologist and Surgeon  Anesthesia Plan Comments:         Anesthesia Quick Evaluation

## 2019-01-27 NOTE — H&P (Signed)
Office Visit Report     01/06/2019   --------------------------------------------------------------------------------   Jillian Lee  MRN: [id number]  DOB: Mar 15, 1976, 43 year old Female  SSN:    PRIMARY CARE:  Emeterio Reeve, DO  REFERRING:  Emeterio Reeve, DO  PROVIDER:  Festus Aloe, M.D.  TREATING:  Azucena Fallen  LOCATION:  [facility] [phone]     --------------------------------------------------------------------------------   CC: I have kidney stones.  HPI: Jillian Lee is a 43 year-old female established patient who is here for renal calculi.  12/26/18: She underwent CT scan of the abdomen and pelvis December 20, 2018. This revealed a 17 x 12 mm left renal pelvic stone, HU 1500, ssd 7 cm; and there was also an 8-9 mm left lower pole stone. There were small stones or nephrocalcinosis on the right.   Her UA today is clear. She's passed stones since age 43 or 43.   She is well today. She is staying well hydrated. She quit soda 4 yrs ago but drank energy drinks. She has no heart or lung disease and no blood thinners.   Modifying factors: There are no other modifying factors  Associated signs and symptoms: There are no other associated signs and symptoms  Aggravating and relieving factors: There are no other aggravating or relieving factors  Severity: Moderate  Duration: Persistent    01/06/19: Patient with a significant stone burden in the left kidney. A dominant renal pelvic stone measured 17 mm and was quite dense. There were also a few left lower pole stones and a posterior mid-lower pole stone. PCNL was recommended, but she favored a noninvasive approach and elected to proceed with a staged ureteroscopy. She is s/p left URS on 6/23. Ureteral stent was left in place and the need for second procedure was indicated based on review of the operative note. She was placed on Nitrofurantoin daily following her procedure. Today she presents with  concerns. She complains of intermittent episodes of colicky left flank and LLQ pain. She states that voiding does not make the pain worse. She states that pain is typically well controlled with PRN use of Hydrocodone, which she is using sparingly for more severe episodes of pain. She complains of gross hematuria, but denies clots or difficulties voiding. She does have dysuria, increased urgency, and increase frequency from her baseline. She has had mild nausea with pain, but denies vomiting. No fevers or chills.   The problem is on the left side. She first stated noticing pain on 12/20/2018. This is not her first kidney stone. She has had more than 5 stones prior to getting this one. She is currently having flank pain. She denies having back pain, groin pain, nausea, vomiting, fever, and chills. She has not caught a stone in her urine strainer since her symptoms began.   She has never had surgical treatment for calculi in the past.     ALLERGIES: Biaxin TABS clarithromycin    MEDICATIONS: Levothyroxine Sodium 50 mcg tablet  Aspirin Ec 81 mg tablet, delayed release  Atorvastatin Calcium 20 mg tablet  Aviane 0.1 mg-20 mcg tablet  Birth Control Pill  Cetirizine Hcl 5 mg tablet Oral  Cetirizine Hcl 10 mg tablet  Ferrous Sulfate 325 mg (65 mg iron) tablet  Fluticasone Propionate 50 mcg/actuation spray, suspension  Ipratropium Bromide 42 mcg (0.06 %) aerosol, spray  Lisinopril-Hydrochlorothiazide 20 mg-25 mg tablet  Multiple Vitamin  Multivitamins tablet Oral  Nitroglycerin 0.3 mg tablet, sublingual  Oxycodone-Acetaminophen 5 mg-325 mg tablet  Pantoprazole Sodium 40 mg tablet, delayed release  Valacyclovir 500 mg tablet  Valtrex 500 mg tablet Oral  Vitamin C     Notes: requesting refill on oxycodone     GU PSH: Ureteroscopic laser litho, Left - 12/31/2018       PSH Notes:  Foot Surgery Right   NON-GU PSH: None   GU PMH: Renal calculus - 12/26/2018, Nephrolithiasis, - 2015 History  of urolithiasis, History of renal calculi - 2015    NON-GU PMH: Anxiety, Anxiety - 2015 Encounter for general adult medical examination without abnormal findings, Encounter for preventive health examination - 2015 Personal history of other diseases of the circulatory system, History of hypertension - 2015 Personal history of other diseases of the digestive system, History of esophageal reflux - 2015 Personal history of other endocrine, nutritional and metabolic disease, History of hypercholesterolemia - 2015 Personal history of other infectious and parasitic diseases, History of infection due to human papilloma virus (HPV) - 2015, History of herpes simplex infection, - 2015 GERD Hypercholesterolemia Hypertension Hyperthyroidism    FAMILY HISTORY: Death of family member - Mother Deceased - Runs In Family Diabetes - Runs In Family hematuria syndrome - Runs In Family Hypertension - Runs in Family Kidney Stones - Grandmother, Runs In Family, Uncle malignant neoplasm - Runs In Family    Notes: 1 son     SOCIAL HISTORY: Marital Status: Married Preferred Language: English; Ethnicity: Not Hispanic Or Latino; Race: White Current Smoking Status: Patient has never smoked.   Tobacco Use Assessment Completed: Used Tobacco in last 30 days? Does not use smokeless tobacco. Drinks 1 drink per month.  Drinks 1 caffeinated drink per day. 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    REVIEW OF SYSTEMS:    GU Review Female:   Patient reports frequent urination, hard to postpone urination, and burning /pain with urination. Patient denies get up at night to urinate, leakage of urine, stream starts and stops, trouble starting your stream, have to strain to urinate, and being pregnant.  Gastrointestinal (Upper):   Patient reports nausea. Patient denies vomiting and indigestion/ heartburn.  Gastrointestinal (Lower):   Patient denies diarrhea and constipation.  Constitutional:   Patient denies fever,  night sweats, weight loss, and fatigue.  Skin:   Patient denies skin rash/ lesion and itching.  Eyes:   Patient denies blurred vision and double vision.  Ears/ Nose/ Throat:   Patient denies sore throat and sinus problems.  Hematologic/Lymphatic:   Patient denies swollen glands and easy bruising.  Cardiovascular:   Patient denies leg swelling and chest pains.  Respiratory:   Patient denies cough and shortness of breath.  Endocrine:   Patient denies excessive thirst.  Musculoskeletal:   Patient denies back pain and joint pain.  Neurological:   Patient denies headaches and dizziness.  Psychologic:   Patient denies depression and anxiety.   Notes: blood in urine- pain scale 5    VITAL SIGNS:      01/06/2019 09:52 AM  Weight 125 lb / 56.7 kg  Height 59 in / 149.86 cm  BP 114/74 mmHg  Pulse 76 /min  Temperature 98.5 F / 36.9 C  BMI 25.2 kg/m   MULTI-SYSTEM PHYSICAL EXAMINATION:    Constitutional: Well-nourished. No physical deformities. Normally developed. Good grooming. She looks well. No acute distress.   Respiratory: No labored breathing, no use of accessory muscles.   Cardiovascular: Normal temperature, normal extremity pulses, no swelling, no varicosities.  Neurologic / Psychiatric: Oriented to time, oriented to place, oriented to  person. No depression, no anxiety, no agitation.  Musculoskeletal: Normal gait and station of head and neck.     PAST DATA REVIEWED:  Source Of History:  Patient  Lab Test Review:   BMP, CBC with Diff  Records Review:   Previous Patient Records  Urine Test Review:   Urinalysis, Urine Culture  X-Ray Review: C.T. Abdomen/Pelvis: Reviewed Films. Reviewed Report.    Notes:                     6/22 labs- Creatinine 0.91, Hgb 14.9   PROCEDURES:         KUB - 74018  A single view of the abdomen is obtained. Left ureteral stent remains in good position. She continues to have some stone burden noted within the left kidney/left renal pelvis. No obvious  stones noted along the course of the left ureter.       Patient confirmed No Neulasta OnPro Device.            Urinalysis w/Scope - 81001 Dipstick Dipstick Cont'd Micro  Color: Amber Bilirubin: Neg WBC/hpf: 10 - 20/hpf  Appearance: Cloudy Ketones: Neg RBC/hpf: >60/hpf  Specific Gravity: 1.025 Blood: 3+ Bacteria: Few (10-25/hpf)  pH: 6.0 Protein: 2+ Cystals: NS (Not Seen)  Glucose: Neg Urobilinogen: 0.2 Casts: NS (Not Seen)    Nitrites: Neg Trichomonas: Not Present    Leukocyte Esterase: 3+ Mucous: Not Present      Epithelial Cells: 0 - 5/hpf      Yeast: NS (Not Seen)      Sperm: Not Present    Notes:            Ketoralac 29m - [id number] injection site was sterilely prepped with alcohol. Ketoralac was injected (IM) using standard technique. The patient tolerated the procedure well.   Qty: 30 Adm. By: CHurshel Keys Unit: mg Lot No [id number] Route: IM Exp. Date 03/10/2020  Freq: None Mfgr.:   Site: Left Buttock   ASSESSMENT:      ICD-10 Details  1 GU:   Renal calculus - N20.0    PLAN:            Medications New Meds: Ketorolac Tromethamine 10 mg tablet 1 tablet PO Q 6 H PRN   #20  0 Refill(s)  Oxybutynin Chloride 5 mg tablet 1 tablet PO Q 8 H PRN   #30  0 Refill(s)  Hydrocodone-Acetaminophen 5 mg-325 mg tablet 1 tablet PO Q 6 H PRN   #20  0 Refill(s)  Zofran 4 mg tablet 1 tablet PO Q 8 H PRN For nausea  #20  0 Refill(s)            Orders Labs Urine Culture  X-Rays: KUB  X-Ray Notes: History:  Hematuria: Yes/No  Patient to see MD after exam: Yes/No  Previous exam: CT / IVP/ US/ KUB/ None  When:  Where:  Diabetic: Yes/ No  BUN/ Creatinine:  Date of last BUN Creatinine:  Weight in pounds:  Allergy- IV Contrast: Yes/ No  Conflicting diabetic meds: Yes/ No  Diabetic Meds:  Prior Authorization #:            [facility]: [facility] [phone]- Ketoralac 351m(Toradol Per 15 Mg) - [id number]        Document Letter(s):  Created for Patient: Clinical Summary         Notes:   Urinalysis today is not overtly concerning  for infectious processes, but I will send for urine culture. KUB imaging shows that her left ureteral stent remains well positioned. I provided reassurance today and discussed that symptoms are likely related to ureteral stent. Injection of Ketoralac given for pain in the clinic today and I provided oral therapy for use with mild to moderate pain. I did refill Hydrocodone today for use of more severe pain. Zofran prescribed for nausea, as well. Given her complaints today I also feel she would benefit from Oxybutynin. We reviewed indications and potential side effects of all medications prescribed. I encouraged that she continue to hydrate well. We will be in touch to get her scheduled for second stage intervention. Return precuations reviewed for fevers or progressive pain, nausea, or vomiting.     * Signed by Azucena Fallen on 01/06/19 at 5:05 PM (EDT*     The information contained in this medical record document is considered private and confidential patient information. This information can only be used for the medical diagnosis and/or medical services that are being provided by the patient's selected caregivers. This information can only be distributed outside of the patient's care if the patient agrees and signs waivers of authorization for this information to be sent to an outside source or route.  Addendum: urine cx negative.

## 2019-01-27 NOTE — Progress Notes (Signed)
Spoke w/ pt via phone for pre-op interview.  Npo after mn.  Arrive at 0530.  Needs istat 8 and urine preg.  Current ekg in chart and epic.   Pt had covid test done 01-24-2019.  Will take protonix am dos w/ sips of water.

## 2019-01-27 NOTE — Progress Notes (Signed)
SPOKE W/  _ pt via [place] 19:   COUGH-- no  RUNNY NOSE--- no  SORE THROAT--- no  NASAL CONGESTION---- no  SNEEZING---- no  SHORTNESS OF BREATH--- no  DIFFICULTY BREATHING--- no  TEMP >100.0 ----- no  UNEXPLAINED BODY ACHES------ no  CHILLS --------  no  HEADACHES --------- no  LOSS OF SMELL/ TASTE -------- no    HAVE YOU OR ANY FAMILY MEMBER TRAVELLED PAST 14 DAYS OUT OF THE   COUNTY--- no STATE---- no COUNTRY---- no  HAVE YOU OR ANY FAMILY MEMBER BEEN EXPOSED TO ANYONE WITH COVID 19?    denies

## 2019-01-28 ENCOUNTER — Encounter (HOSPITAL_BASED_OUTPATIENT_CLINIC_OR_DEPARTMENT_OTHER): Payer: Self-pay

## 2019-01-28 ENCOUNTER — Ambulatory Visit (HOSPITAL_BASED_OUTPATIENT_CLINIC_OR_DEPARTMENT_OTHER)
Admission: RE | Admit: 2019-01-28 | Discharge: 2019-01-28 | Disposition: A | Discharge status: Home / Self Care | Payer: 59 | Source: Ambulatory Visit | Attending: Urology | Admitting: Urology

## 2019-01-28 ENCOUNTER — Other Ambulatory Visit: Payer: Self-pay | Admitting: Osteopathic Medicine

## 2019-01-28 ENCOUNTER — Encounter (HOSPITAL_BASED_OUTPATIENT_CLINIC_OR_DEPARTMENT_OTHER)
Admission: RE | Disposition: A | Discharge status: Home / Self Care | Payer: Self-pay | Source: Ambulatory Visit | Attending: Urology

## 2019-01-28 ENCOUNTER — Ambulatory Visit (HOSPITAL_BASED_OUTPATIENT_CLINIC_OR_DEPARTMENT_OTHER): Payer: 59 | Admitting: Anesthesiology

## 2019-01-28 DIAGNOSIS — Z79899 Other long term (current) drug therapy: Secondary | ICD-10-CM | POA: Insufficient documentation

## 2019-01-28 DIAGNOSIS — N200 Calculus of kidney: Secondary | ICD-10-CM

## 2019-01-28 DIAGNOSIS — I10 Essential (primary) hypertension: Secondary | ICD-10-CM | POA: Insufficient documentation

## 2019-01-28 DIAGNOSIS — N202 Calculus of kidney with calculus of ureter: Secondary | ICD-10-CM | POA: Diagnosis not present

## 2019-01-28 DIAGNOSIS — Z7982 Long term (current) use of aspirin: Secondary | ICD-10-CM | POA: Insufficient documentation

## 2019-01-28 DIAGNOSIS — K219 Gastro-esophageal reflux disease without esophagitis: Secondary | ICD-10-CM | POA: Insufficient documentation

## 2019-01-28 DIAGNOSIS — Z79890 Hormone replacement therapy (postmenopausal): Secondary | ICD-10-CM | POA: Diagnosis not present

## 2019-01-28 DIAGNOSIS — E039 Hypothyroidism, unspecified: Secondary | ICD-10-CM | POA: Insufficient documentation

## 2019-01-28 DIAGNOSIS — Z793 Long term (current) use of hormonal contraceptives: Secondary | ICD-10-CM | POA: Diagnosis not present

## 2019-01-28 DIAGNOSIS — E7800 Pure hypercholesterolemia, unspecified: Secondary | ICD-10-CM | POA: Diagnosis not present

## 2019-01-28 DIAGNOSIS — E034 Atrophy of thyroid (acquired): Secondary | ICD-10-CM

## 2019-01-28 DIAGNOSIS — D649 Anemia, unspecified: Secondary | ICD-10-CM | POA: Diagnosis not present

## 2019-01-28 HISTORY — DX: Essential (primary) hypertension: I10

## 2019-01-28 HISTORY — DX: Respiratory disorder, unspecified: J98.9

## 2019-01-28 HISTORY — DX: Iron deficiency anemia, unspecified: D50.9

## 2019-01-28 HISTORY — PX: URETEROSCOPY WITH HOLMIUM LASER LITHOTRIPSY: SHX6645

## 2019-01-28 LAB — POCT I-STAT, CHEM 8
BUN: 23 mg/dL — ABNORMAL HIGH (ref 6–20)
Calcium, Ion: 1.24 mmol/L (ref 1.15–1.40)
Chloride: 105 mmol/L (ref 98–111)
Creatinine, Ser: 0.9 mg/dL (ref 0.44–1.00)
Glucose, Bld: 117 mg/dL — ABNORMAL HIGH (ref 70–99)
HCT: 41 % (ref 36.0–46.0)
Hemoglobin: 13.9 g/dL (ref 12.0–15.0)
Potassium: 4 mmol/L (ref 3.5–5.1)
Sodium: 140 mmol/L (ref 135–145)
TCO2: 25 mmol/L (ref 22–32)

## 2019-01-28 LAB — POCT PREGNANCY, URINE: Preg Test, Ur: NEGATIVE

## 2019-01-28 SURGERY — URETEROSCOPY, WITH LITHOTRIPSY USING HOLMIUM LASER
Anesthesia: General | Site: Ureter | Laterality: Left

## 2019-01-28 MED ORDER — FENTANYL CITRATE (PF) 100 MCG/2ML IJ SOLN
INTRAMUSCULAR | Status: DC | PRN
  Administered 2019-01-28 (×2): 50 ug via INTRAVENOUS
  Administered 2019-01-28 (×2): 25 ug via INTRAVENOUS
  Administered 2019-01-28: 50 ug via INTRAVENOUS

## 2019-01-28 MED ORDER — CEFAZOLIN SODIUM-DEXTROSE 2-4 GM/100ML-% IV SOLN
2.0000 g | Freq: Once | INTRAVENOUS | Status: AC
  Administered 2019-01-28: 2 g via INTRAVENOUS
  Filled 2019-01-28: qty 100

## 2019-01-28 MED ORDER — DEXAMETHASONE SODIUM PHOSPHATE 10 MG/ML IJ SOLN
INTRAMUSCULAR | Status: DC | PRN
  Administered 2019-01-28: 10 mg via INTRAVENOUS

## 2019-01-28 MED ORDER — ACETAMINOPHEN 160 MG/5ML PO SOLN
325.0000 mg | ORAL | Status: DC | PRN
  Filled 2019-01-28: qty 20.3

## 2019-01-28 MED ORDER — MIDAZOLAM HCL 2 MG/2ML IJ SOLN
INTRAMUSCULAR | Status: AC
  Filled 2019-01-28: qty 2

## 2019-01-28 MED ORDER — FENTANYL CITRATE (PF) 100 MCG/2ML IJ SOLN
INTRAMUSCULAR | Status: AC
  Filled 2019-01-28: qty 2

## 2019-01-28 MED ORDER — CEFAZOLIN SODIUM-DEXTROSE 2-4 GM/100ML-% IV SOLN
INTRAVENOUS | Status: AC
  Filled 2019-01-28: qty 100

## 2019-01-28 MED ORDER — LIDOCAINE 2% (20 MG/ML) 5 ML SYRINGE
INTRAMUSCULAR | Status: DC | PRN
  Administered 2019-01-28: 60 mg via INTRAVENOUS

## 2019-01-28 MED ORDER — KETOROLAC TROMETHAMINE 30 MG/ML IJ SOLN
INTRAMUSCULAR | Status: AC
  Filled 2019-01-28: qty 1

## 2019-01-28 MED ORDER — ONDANSETRON HCL 4 MG/2ML IJ SOLN
4.0000 mg | Freq: Once | INTRAMUSCULAR | Status: AC | PRN
  Administered 2019-01-28: 4 mg via INTRAVENOUS
  Filled 2019-01-28: qty 2

## 2019-01-28 MED ORDER — ONDANSETRON HCL 4 MG/2ML IJ SOLN
INTRAMUSCULAR | Status: AC
  Filled 2019-01-28: qty 2

## 2019-01-28 MED ORDER — KETOROLAC TROMETHAMINE 30 MG/ML IJ SOLN
INTRAMUSCULAR | Status: DC | PRN
  Administered 2019-01-28: 30 mg via INTRAVENOUS

## 2019-01-28 MED ORDER — HYDROMORPHONE HCL 1 MG/ML IJ SOLN
INTRAMUSCULAR | Status: DC | PRN
  Administered 2019-01-28: 0.5 mg via INTRAVENOUS

## 2019-01-28 MED ORDER — LACTATED RINGERS IV SOLN
INTRAVENOUS | Status: DC
  Administered 2019-01-28 (×2): via INTRAVENOUS
  Filled 2019-01-28: qty 1000

## 2019-01-28 MED ORDER — OXYCODONE HCL 5 MG/5ML PO SOLN
5.0000 mg | Freq: Once | ORAL | Status: DC | PRN
  Filled 2019-01-28: qty 5

## 2019-01-28 MED ORDER — SODIUM CHLORIDE 0.9 % IR SOLN
Status: DC | PRN
  Administered 2019-01-28: 3000 mL

## 2019-01-28 MED ORDER — SUGAMMADEX SODIUM 200 MG/2ML IV SOLN
INTRAVENOUS | Status: DC | PRN
  Administered 2019-01-28: 110 mg via INTRAVENOUS

## 2019-01-28 MED ORDER — ACETAMINOPHEN 325 MG PO TABS
325.0000 mg | ORAL_TABLET | ORAL | Status: DC | PRN
  Filled 2019-01-28: qty 2

## 2019-01-28 MED ORDER — ROCURONIUM BROMIDE 100 MG/10ML IV SOLN
INTRAVENOUS | Status: DC | PRN
  Administered 2019-01-28: 20 mg via INTRAVENOUS

## 2019-01-28 MED ORDER — IOHEXOL 300 MG/ML  SOLN
INTRAMUSCULAR | Status: DC | PRN
  Administered 2019-01-28: 1 mL via INTRAVENOUS

## 2019-01-28 MED ORDER — PROPOFOL 10 MG/ML IV BOLUS
INTRAVENOUS | Status: AC
  Filled 2019-01-28: qty 20

## 2019-01-28 MED ORDER — HYDROMORPHONE HCL 2 MG/ML IJ SOLN
INTRAMUSCULAR | Status: AC
  Filled 2019-01-28: qty 1

## 2019-01-28 MED ORDER — FENTANYL CITRATE (PF) 100 MCG/2ML IJ SOLN
25.0000 ug | INTRAMUSCULAR | Status: DC | PRN
  Filled 2019-01-28: qty 1

## 2019-01-28 MED ORDER — PROPOFOL 10 MG/ML IV BOLUS
INTRAVENOUS | Status: DC | PRN
  Administered 2019-01-28: 200 mg via INTRAVENOUS

## 2019-01-28 MED ORDER — DEXAMETHASONE SODIUM PHOSPHATE 10 MG/ML IJ SOLN
INTRAMUSCULAR | Status: AC
  Filled 2019-01-28: qty 1

## 2019-01-28 MED ORDER — MEPERIDINE HCL 25 MG/ML IJ SOLN
6.2500 mg | INTRAMUSCULAR | Status: DC | PRN
  Filled 2019-01-28: qty 1

## 2019-01-28 MED ORDER — ONDANSETRON HCL 4 MG/2ML IJ SOLN
INTRAMUSCULAR | Status: DC | PRN
  Administered 2019-01-28: 4 mg via INTRAVENOUS

## 2019-01-28 MED ORDER — OXYCODONE HCL 5 MG PO TABS
5.0000 mg | ORAL_TABLET | Freq: Once | ORAL | Status: DC | PRN
  Filled 2019-01-28: qty 1

## 2019-01-28 MED ORDER — LIDOCAINE 2% (20 MG/ML) 5 ML SYRINGE
INTRAMUSCULAR | Status: AC
  Filled 2019-01-28: qty 5

## 2019-01-28 MED ORDER — MIDAZOLAM HCL 2 MG/2ML IJ SOLN
INTRAMUSCULAR | Status: DC | PRN
  Administered 2019-01-28: 2 mg via INTRAVENOUS

## 2019-01-28 MED FILL — LEVOTHYROXINE 50 MCG TABLET: 50 | 30 days supply | Qty: 30 | Fill #0

## 2019-01-28 SURGICAL SUPPLY — 26 items
BAG DRAIN URO-CYSTO SKYTR STRL (DRAIN) ×2 IMPLANT
BASKET ZERO TIP NITINOL 2.4FR (BASKET) ×1 IMPLANT
CATH URET 5FR 28IN CONE TIP (BALLOONS)
CATH URET 5FR 28IN OPEN ENDED (CATHETERS) IMPLANT
CATH URET 5FR 70CM CONE TIP (BALLOONS) IMPLANT
CATH URET DUAL LUMEN 6-10FR 50 (CATHETERS) IMPLANT
CLOTH BEACON ORANGE TIMEOUT ST (SAFETY) ×2 IMPLANT
FIBER LASER FLEXIVA 200 (UROLOGICAL SUPPLIES) ×1 IMPLANT
GLOVE BIO SURGEON STRL SZ7.5 (GLOVE) ×2 IMPLANT
GLOVE BIOGEL PI IND STRL 8.5 (GLOVE) IMPLANT
GLOVE BIOGEL PI INDICATOR 8.5 (GLOVE) ×1
GLOVE INDICATOR 8.5 STRL (GLOVE) ×1 IMPLANT
GOWN STRL REUS W/ TWL XL LVL3 (GOWN DISPOSABLE) IMPLANT
GOWN STRL REUS W/TWL LRG LVL3 (GOWN DISPOSABLE) ×2 IMPLANT
GOWN STRL REUS W/TWL XL LVL3 (GOWN DISPOSABLE) ×1
GUIDEWIRE ANG ZIPWIRE 038X150 (WIRE) ×1 IMPLANT
GUIDEWIRE STR DUAL SENSOR (WIRE) ×2 IMPLANT
IV NS IRRIG 3000ML ARTHROMATIC (IV SOLUTION) ×3 IMPLANT
KIT TURNOVER CYSTO (KITS) ×2 IMPLANT
MANIFOLD NEPTUNE II (INSTRUMENTS) ×1 IMPLANT
NS IRRIG 500ML POUR BTL (IV SOLUTION) ×1 IMPLANT
PACK CYSTO (CUSTOM PROCEDURE TRAY) ×2 IMPLANT
SHEATH URETERAL 12FRX28CM (UROLOGICAL SUPPLIES) ×1 IMPLANT
STENT URET 6FRX26 CONTOUR (STENTS) ×1 IMPLANT
TUBE CONNECTING 12X1/4 (SUCTIONS) ×1 IMPLANT
TUBING UROLOGY SET (TUBING) ×1 IMPLANT

## 2019-01-28 NOTE — Telephone Encounter (Signed)
Requested Prescriptions  Pending Prescriptions Disp Refills  . levothyroxine (SYNTHROID) 50 MCG tablet [Pharmacy Med Name: LEVOTHYROXINE 50 MCG TABLET 50 TAB] 30 tablet 2    Sig: TAKE 1 TABLET BY MOUTH DAILY BEFORE BREAKFAST.     Endocrinology:  Hypothyroid Agents Failed - 01/28/2019  1:09 PM      Failed - TSH needs to be rechecked within 3 months after an abnormal result. Refill until TSH is due.      Failed - Valid encounter within last 12 months    Recent Outpatient Visits          1 week ago Reactive airway disease that is not asthma   [facility], Lanelle Bal, DO   1 month ago Nephrolithiasis   [facility], Lanelle Bal, DO   1 month ago Gastroesophageal reflux disease, esophagitis presence not specified   [facility], Lanelle Bal, DO   7 months ago Viral URI with cough   [facility], Lanelle Bal, DO   1 year ago Cough   [facility], Rebekah [place], MD             Passed - TSH in normal range and within 360 days    TSH  Date Value Ref Range Status  07/17/2018 1.10 mIU/L Final    Comment:              Reference Range .           > or = 20 Years  0.40-4.50 .                Pregnancy Ranges           First trimester    0.26-2.66           Second trimester   0.55-2.73           Third trimester    0.43-2.91

## 2019-01-28 NOTE — Discharge Instructions (Signed)
Ureteral Stent Implantation, Care After This sheet gives you information about how to care for yourself after your procedure. Your health care provider may also give you more specific instructions. If you have problems or questions, contact your health care provider.  Removal of the stent: Remove the stent on Friday morning, January 31, 2019 by pulling the string with slow steady pressure.  What can I expect after the procedure? After the procedure, it is common to have:  Nausea.  Mild pain when you urinate. You may feel this pain in your lower back or lower abdomen. The pain should stop within a few minutes after you urinate. This may last for up to 1 week.  A small amount of blood in your urine for several days. Follow these instructions at home: Medicines  Take over-the-counter and prescription medicines only as told by your health care provider.  If you were prescribed an antibiotic medicine, take it as told by your health care provider. Do not stop taking the antibiotic even if you start to feel better.  Do not drive for 24 hours if you were given a sedative during your procedure.  Ask your health care provider if the medicine prescribed to you requires you to avoid driving or using heavy machinery. Activity  Rest as told by your health care provider.  Avoid sitting for a long time without moving. Get up to take short walks every 1-2 hours. This is important to improve blood flow and breathing. Ask for help if you feel weak or unsteady.  Return to your normal activities as told by your health care provider. Ask your health care provider what activities are safe for you. General instructions   Watch for any blood in your urine. Call your health care provider if the amount of blood in your urine increases.  If you have a catheter: ? Follow instructions from your health care provider about taking care of your catheter and collection bag. ? Do not take baths, swim, or use a hot tub  until your health care provider approves. Ask your health care provider if you may take showers. You may only be allowed to take sponge baths.  Drink enough fluid to keep your urine pale yellow.  Do not use any products that contain nicotine or tobacco, such as cigarettes, e-cigarettes, and chewing tobacco. These can delay healing after surgery. If you need help quitting, ask your health care provider.  Keep all follow-up visits as told by your health care provider. This is important. Contact a health care provider if:  You have pain that gets worse or does not get better with medicine, especially pain when you urinate.  You have difficulty urinating.  You feel nauseous or you vomit repeatedly during a period of more than 2 days after the procedure. Get help right away if:  Your urine is dark red or has blood clots in it.  You are leaking urine (have incontinence).  The end of the stent comes out of your urethra.  You cannot urinate.  You have sudden, sharp, or severe pain in your abdomen or lower back.  You have a fever.  You have swelling or pain in your legs.  You have difficulty breathing. Summary  After the procedure, it is common to have mild pain when you urinate that goes away within a few minutes after you urinate. This may last for up to 1 week.  Watch for any blood in your urine. Call your health care provider if the  amount of blood in your urine increases.  Take over-the-counter and prescription medicines only as told by your health care provider.  Drink enough fluid to keep your urine pale yellow. This information is not intended to replace advice given to you by your health care provider. Make sure you discuss any questions you have with your health care provider. Document Released: 02/26/2013 Document Revised: 04/02/2018 Document Reviewed: 04/03/2018 Elsevier Patient Education  [place] Instructions  Activity: Get  plenty of rest for the remainder of the day. A responsible adult should stay with you for 24 hours following the procedure.  For the next 24 hours, DO NOT: -Drive a car -Paediatric nurse -Drink alcoholic beverages -Take any medication unless instructed by your physician -Make any legal decisions or sign important papers.  Meals: Start with liquid foods such as gelatin or soup. Progress to regular foods as tolerated. Avoid greasy, spicy, heavy foods. If nausea and/or vomiting occur, drink only clear liquids until the nausea and/or vomiting subsides. Call your physician if vomiting continues.  Special Instructions/Symptoms: Your throat may feel dry or sore from the anesthesia or the breathing tube placed in your throat during surgery. If this causes discomfort, gargle with warm salt water. The discomfort should disappear within 24 hours.  If you had a scopolamine patch placed behind your ear for the management of post- operative nausea and/or vomiting:  1. The medication in the patch is effective for 72 hours, after which it should be removed.  Wrap patch in a tissue and discard in the trash. Wash hands thoroughly with soap and water. 2. You may remove the patch earlier than 72 hours if you experience unpleasant side effects which may include dry mouth, dizziness or visual disturbances. 3. Avoid touching the patch. Wash your hands with soap and water after contact with the patch.

## 2019-01-28 NOTE — Anesthesia Postprocedure Evaluation (Signed)
Anesthesia Post Note  Patient: SERYNA MAREK  Procedure(s) Performed: URETEROSCOPY WITH HOLMIUM LASER LITHOTRIPSY/ STENT EXCHANGE (Left Ureter)     Patient location during evaluation: PACU Anesthesia Type: General Level of consciousness: awake and alert Pain management: pain level controlled Vital Signs Assessment: post-procedure vital signs reviewed and stable Respiratory status: spontaneous breathing, nonlabored ventilation, respiratory function stable and patient connected to nasal cannula oxygen Cardiovascular status: blood pressure returned to baseline and stable Postop Assessment: no apparent nausea or vomiting Anesthetic complications: no    Last Vitals:  Vitals:   01/28/19 0930 01/28/19 1130  BP: 115/73 126/70  Pulse: 83 80  Resp: 12 14  Temp:  36.6 C  SpO2: 98% 100%    Last Pain:  Vitals:   01/28/19 1130  TempSrc:   PainSc: 2                  Lyon Dumont

## 2019-01-28 NOTE — Op Note (Signed)
Preoperative diagnosis: Left renal stones Postoperative diagnosis: Left ureteral stones, left renal stones  Procedure: Cystoscopy, left ureteroscopy with stone basket extraction, holmium laser lithotripsy, ureteral stent exchange  Surgeon: Junious Silk  Anesthesia: General  Indication for procedure: Jillian Lee is a 43 year old female with a large left renal pelvic stone.  She underwent ureteroscopy a few weeks ago and was brought back for staged procedure given the size of the stone.  She favored a noninvasive approach as far as avoiding a PCNL.  Findings: Multiple fragments were along the ureter and were cleared.  There was a pocket of stones in the midpole calyx posterior lower pole calyx and an inferior lower pole calyx which was fairly inaccessible.  A large fragment or fragments remain in the left lower pole.  She may need left lower pole ESWL in the future.  Description of procedure: After consent was obtained the patient was brought to the operating room.  After adequate anesthesia she is placed in lithotomy position and prepped and draped in usual sterile fashion.  A timeout was performed to confirm the patient and procedure.  The cystoscope was passed per urethra and the left ureteral stent grasped and removed through the urethral meatus.  A Glidewire was advanced through the stent and coiled in the left renal pelvis.  Scout imaging revealed there to be some mid and lower pole stone fragments but no obvious fragments along the ureter.  I tried to pass the access sheath but there was some resistance so I tried to pass the inner of the sheath and there was resistance.  Therefore I took a semirigid ureteroscope along the wire and some small fragments were noted in the distal ureter the mid ureter and the proximal ureter.  These fragments were sequentially removed and dropped into the bladder with a 0 tip basket.  I then passed the ureteroscope up to the left UPJ no other stone fragments were noted  and no ureteral injury noted.  A sensor wire was advanced under direct vision.  The scope was backed out and then the access sheath passed over the sensor wire without difficulty.  The digital ureteroscope was advanced into the kidney and a collection of midpole stones were dusted.  I then found the larger fragment in the left posterior lower pole calyx and this was grasped and dropped in the upper pole and dusted.  I then dusted the rest of the stones and a lower pole posterior calyx.  I then went down to the inferior lower pole calyx and this was a sharp 180 turn which was difficult to see with no basket or laser past.  I tried 3 times to grasp some of the larger fragments with the 0 tip basket without success.  I passed the laser a few times and on the edge of some of the fragments tried to perform lithotripsy.  Having clear the remainder of the calyces of any significant stone fragment.  There were no obvious stone fragments on fluoroscopy nor by ureteroscopy except in the very inferior left lower pole I decided to leave these fragments after about 4 failed attempts at grasping the largest pieces.  The collecting system inspected one more time as well as the renal pelvis.  The sheath was backed out on the ureteroscope and the scope backed out and the ureter inspected noted to be free of injury and no ureteral fragments.  A 6 x 26 cm stent was advanced.  The wire was removed a good coil seen in  the kidney and a good coil in the bladder.  Any fragments were drained from the bladder.  The scope was removed and she was awakened and taken to the recovery room in stable condition.  Complications: None  Blood loss: Minimal  Specimens: Stone fragments to office lab  Drains: 6 x 26 cm left ureteral stent with string  Disposition: Patient stable to PACU.  I called and updated her husband Jillian Lee and we discussed the left lower pole fragments or fragment and that she may need shockwave lithotripsy in the future or  other procedure.

## 2019-01-28 NOTE — Transfer of Care (Signed)
Immediate Anesthesia Transfer of Care Note  Patient: Jillian Lee  Procedure(s) Performed: Procedure(s) (LRB): URETEROSCOPY WITH HOLMIUM LASER LITHOTRIPSY/ STENT EXCHANGE (Left)  Patient Location: PACU  Anesthesia Type: General  Level of Consciousness: awake, oriented, sedated and patient cooperative  Airway & Oxygen Therapy: Patient Spontanous Breathing and Patient connected to face mask oxygen  Post-op Assessment: Report given to PACU RN and Post -op Vital signs reviewed and stable  Post vital signs: Reviewed and stable  Complications: No apparent anesthesia complications  Last Vitals:  Vitals Value Taken Time  BP 147/69 01/28/19 0900  Temp 36.4 C 01/28/19 0857  Pulse 98 01/28/19 0901  Resp 15 01/28/19 0901  SpO2 100 % 01/28/19 0901  Vitals shown include unvalidated device data.  Last Pain:  Vitals:   01/28/19 0857  TempSrc:   PainSc: 2       Patients Stated Pain Goal: 5 (01/28/19 0555)

## 2019-01-28 NOTE — Anesthesia Procedure Notes (Signed)
Procedure Name: Intubation Date/Time: 01/28/2019 7:39 AM Performed by: Suan Halter, CRNA Pre-anesthesia Checklist: Patient identified, Emergency Drugs available, Suction available and Patient being monitored Patient Re-evaluated:Patient Re-evaluated prior to induction Oxygen Delivery Method: Circle system utilized Preoxygenation: Pre-oxygenation with 100% oxygen Induction Type: IV induction Ventilation: Mask ventilation without difficulty Tube type: Oral Number of attempts: 1 Airway Equipment and Method: Stylet and Oral airway Placement Confirmation: ETT inserted through vocal cords under direct vision,  positive ETCO2 and breath sounds checked- equal and bilateral Tube secured with: Tape Dental Injury: Teeth and Oropharynx as per pre-operative assessment

## 2019-01-28 NOTE — Interval H&P Note (Signed)
History and Physical Interval Note:  01/28/2019 7:27 AM  Jillian Lee  has presented today for surgery, with the diagnosis of LEFT RENAL STONE.  The various methods of treatment have been discussed with the patient and family. After consideration of risks, benefits and other options for treatment, the patient has consented to  Procedure(s) with comments: URETEROSCOPY WITH HOLMIUM LASER LITHOTRIPSY/ STENT EXCHANGE (Left) - ONLY NEEDS 60 MIN as a surgical intervention. She is doing well. No fever. Discussed new stent.  The patient's history has been reviewed, patient examined, no change in status, stable for surgery.  I have reviewed the patient's chart and labs.  Questions were answered to the patient's satisfaction.     Festus Aloe

## 2019-01-28 NOTE — OR Nursing (Signed)
Left ureteral stent was removed by Dr. Junious Silk

## 2019-01-29 ENCOUNTER — Encounter (HOSPITAL_BASED_OUTPATIENT_CLINIC_OR_DEPARTMENT_OTHER): Payer: Self-pay | Admitting: Urology

## 2019-02-10 ENCOUNTER — Other Ambulatory Visit: Payer: Self-pay | Admitting: Osteopathic Medicine

## 2019-02-10 DIAGNOSIS — B009 Herpesviral infection, unspecified: Secondary | ICD-10-CM

## 2019-02-10 MED FILL — VALACYCLOVIR HCL 500 MG TAB: 500 | 30 days supply | Qty: 30 | Fill #0

## 2019-02-10 NOTE — Telephone Encounter (Signed)
Requested Prescriptions  Pending Prescriptions Disp Refills  . valACYclovir (VALTREX) 500 MG tablet [Pharmacy Med Name: VALACYCLOVIR HCL 500 MG TAB 500 TAB] 30 tablet 0    Sig: TAKE 1 TABLET BY MOUTH DAILY     Antimicrobials:  Antiviral Agents - Anti-Herpetic Failed - 02/10/2019  2:26 PM      Failed - Valid encounter within last 12 months    Recent Outpatient Visits          3 weeks ago Reactive airway disease that is not asthma   [place] [facility], Lanelle Bal, DO   1 month ago Nephrolithiasis   [facility], Lanelle Bal, DO   2 months ago Gastroesophageal reflux disease, esophagitis presence not specified   [place] [facility], Lanelle Bal, DO   7 months ago Viral URI with cough   [place] [facility], Lanelle Bal, DO   1 year ago Cough   [place] [facility], Rebekah [place], MD             Spoke with patient, advised she is due for routine follow up.  She states she will call to schedule.  30 day courtesy refill sent.

## 2019-02-17 ENCOUNTER — Other Ambulatory Visit: Payer: Self-pay | Admitting: Osteopathic Medicine

## 2019-02-17 MED FILL — PANTOPRAZOLE SOD DR 40 MG T: 40 | 60 days supply | Qty: 120 | Fill #0

## 2019-02-20 DIAGNOSIS — Z760 Encounter for issue of repeat prescription: Secondary | ICD-10-CM | POA: Diagnosis not present

## 2019-02-26 ENCOUNTER — Other Ambulatory Visit: Payer: Self-pay | Admitting: Osteopathic Medicine

## 2019-02-26 DIAGNOSIS — Z3041 Encounter for surveillance of contraceptive pills: Secondary | ICD-10-CM

## 2019-02-26 NOTE — Telephone Encounter (Signed)
[facility] requesting med refill for [place]. Rx not listed in active med list. Pls advise, thanks.

## 2019-02-26 NOTE — Telephone Encounter (Signed)
Please advise 

## 2019-02-27 MED FILL — LARISSIA 0.1-20 MG-MCG TABS: 0.1-20 | 84 days supply | Qty: 84 | Fill #0

## 2019-02-28 MED FILL — LEVOTHYROXINE 50 MCG TABLET: 50 | 30 days supply | Qty: 30 | Fill #1

## 2019-03-07 ENCOUNTER — Encounter: Payer: Self-pay | Admitting: Osteopathic Medicine

## 2019-03-07 ENCOUNTER — Ambulatory Visit (INDEPENDENT_AMBULATORY_CARE_PROVIDER_SITE_OTHER): Payer: 59 | Admitting: Osteopathic Medicine

## 2019-03-07 VITALS — Wt 124.0 lb

## 2019-03-07 DIAGNOSIS — J0100 Acute maxillary sinusitis, unspecified: Secondary | ICD-10-CM

## 2019-03-07 MED ORDER — AMOXICILLIN-POT CLAVULANATE 875-125 MG PO TABS: 1.0000 | ORAL_TABLET | Freq: Two times a day (BID) | ORAL | 0 refills | Status: AC

## 2019-03-07 MED ORDER — IPRATROPIUM BROMIDE 0.06 % NA SOLN: 2.0000 | Freq: Four times a day (QID) | NASAL | 1 refills | Status: DC

## 2019-03-07 MED FILL — IPRATROPIUM 0.06% SPRAY: 0.06 | 10 days supply | Qty: 15 | Fill #0

## 2019-03-07 MED FILL — AMOX-CLAV 875-125 MG TABLET: 875-125 | 7 days supply | Qty: 14 | Fill #0

## 2019-03-07 NOTE — Progress Notes (Signed)
Virtual Visit via Video (App used: Doximity) Note  I connected with      Jillian Lee on 03/07/19 at 9:51 AM by a telemedicine application and verified that I am speaking with the correct person using two identifiers.  Patient is at home I am in office   I discussed the limitations of evaluation and management by telemedicine and the availability of in person appointments. The patient expressed understanding and agreed to proceed.  History of Present Illness: Jillian Lee is a 43 y.o. female who would like to discuss follow up re: kidneys. Reports sinus issues   Reports no pain issues or kidney problems, Sinus problems: feels head congestion, eye pressure and sinus pressure. Tried Mucinex cold/flu. Has been going on about 3 weeks.  Has history of pretty significant sinus allergies, allergies have been bothering her over the past couple of months.    Observations/Objective: Wt 124 lb (56.2 kg)   BMI 26.83 kg/m  BP Readings from Last 3 Encounters:  01/28/19 126/70  01/08/19 120/76  12/31/18 128/75   Exam: Normal Speech.  NAD  Lab and Radiology Results No results found for this or any previous visit (from the past 72 hour(s)). No results found.     Assessment and Plan: 43 y.o. female with The encounter diagnosis was Acute non-recurrent maxillary sinusitis.   PDMP not reviewed this encounter. No orders of the defined types were placed in this encounter.  Meds ordered this encounter  Medications  . amoxicillin-clavulanate (AUGMENTIN) 875-125 MG tablet    Sig: Take 1 tablet by mouth 2 (two) times daily for 7 days.    Dispense:  14 tablet    Refill:  0  . ipratropium (ATROVENT) 0.06 % nasal spray    Sig: Place 2 sprays into both nostrils 4 (four) times daily. As needed for sinus congestion    Dispense:  15 mL    Refill:  1    Follow Up Instructions: Return for Will be due for prediabetes follow-up 06/2019.    I discussed the assessment and treatment  plan with the patient. The patient was provided an opportunity to ask questions and all were answered. The patient agreed with the plan and demonstrated an understanding of the instructions.   The patient was advised to call back or seek an in-person evaluation if any new concerns, if symptoms worsen or if the condition fails to improve as anticipated.  15 minutes of non-face-to-face time was provided during this encounter.                      Historical information moved to improve visibility of documentation.  Past Medical History:  Diagnosis Date  . GERD (gastroesophageal reflux disease)   . History of kidney stones   . HSV (herpes simplex virus) infection   . Hypercholesterolemia   . Hypertension   . Hypothyroidism   . IDA (iron deficiency anemia)   . Pre-diabetes   . Reactive airway disease that is not asthma    per pt pcp in epic 01-08-2019  per had chest discomfort with breathing,  symbicort inhaler given, (01-27-2019  per pt stop inhaler on daily basis condition improved   Past Surgical History:  Procedure Laterality Date  . CYSTOSCOPY/URETEROSCOPY/HOLMIUM LASER/STENT PLACEMENT Left 12/31/2018   Procedure: CYSTOSCOPY/URETEROSCOPY/HOLMIUM LASER/STENT PLACEMENT;LEFT RETROGRADE PYELOGRAM;STONE BASKET EXTRACTION;  Surgeon: Festus Aloe, MD;  Location: [facility];  Service: Urology;  Laterality: Left;  . URETEROSCOPY WITH HOLMIUM LASER LITHOTRIPSY Left 01/28/2019  Procedure: URETEROSCOPY WITH HOLMIUM LASER LITHOTRIPSY/ STENT EXCHANGE;  Surgeon: Festus Aloe, MD;  Location: [facility];  Service: Urology;  Laterality: Left;   Social History   Tobacco Use  . Smoking status: Never Smoker  . Smokeless tobacco: Never Used  Substance Use Topics  . Alcohol use: No   family history includes Alcohol abuse in her maternal uncle and mother; Anxiety disorder in her mother; Cancer in her maternal grandmother; Dementia in her mother; Depression in her  mother; Diabetes in her maternal grandmother and maternal uncle; Hypertension in her maternal uncle and mother; Stroke in her mother.  Medications: Current Outpatient Medications  Medication Sig Dispense Refill  . Ascorbic Acid (VITAMIN C) 1000 MG tablet Take 1,000 mg by mouth daily with lunch. W/Rose Hips    . aspirin 81 MG chewable tablet Chew 1 tablet (81 mg total) by mouth daily. 90 tablet 3  . atorvastatin (LIPITOR) 20 MG tablet Take 1 tablet (20 mg total) by mouth daily. 90 tablet 3  . cetirizine (ZYRTEC) 10 MG tablet Take 10 mg by mouth daily as needed for allergies.     . [employer] MAXIMUM REDNESS RELIEF 0.03-0.5 % SOLN Place 1 drop into both eyes 3 (three) times daily.    . ferrous sulfate 325 (65 FE) MG tablet Take 1 tablet (325 mg total) by mouth daily with breakfast. (Patient taking differently: Take 325 mg by mouth daily with lunch. ) 90 tablet 3  . HYDROcodone-acetaminophen (NORCO/VICODIN) 5-325 MG tablet Take 1 tablet by mouth 2 (two) times a day.    . ipratropium (ATROVENT) 0.06 % nasal spray Place 2 sprays into both nostrils 4 (four) times daily. As needed for sinus congestion (Patient taking differently: Place 2 sprays into both nostrils 4 (four) times daily as needed (sinus congestion.). ) 15 mL 1  . ketorolac (TORADOL) 10 MG tablet Take 10 mg by mouth every 6 (six) hours as needed.    Marland Kitchen LARISSIA 0.1-20 MG-MCG tablet TAKE 1 TABLET BY MOUTH DAILY. 84 tablet 3  . levothyroxine (SYNTHROID) 50 MCG tablet TAKE 1 TABLET BY MOUTH DAILY BEFORE BREAKFAST. 30 tablet 2  . lisinopril-hydrochlorothiazide (PRINZIDE,ZESTORETIC) 20-25 MG tablet Take 1 tablet by mouth daily. 90 tablet 3  . Multiple Vitamins-Minerals (MULTIVITAMIN WITH MINERALS) tablet Take 1 tablet by mouth daily with lunch.     . nitroGLYCERIN (NITROSTAT) 0.3 MG SL tablet Place 1 tablet (0.3 mg total) under the tongue every 5 (five) minutes as needed for chest pain. 15 tablet 12  . pantoprazole (PROTONIX) 40 MG tablet TAKE 1 TABLET  BY MOUTH 2 TIMES DAILY FOR 8 WEEKS 120 tablet 0  . tamsulosin (FLOMAX) 0.4 MG CAPS capsule Take 1 capsule (0.4 mg total) by mouth daily. Until passage of stone, max use 4 weeks 30 capsule 0  . valACYclovir (VALTREX) 500 MG tablet TAKE 1 TABLET BY MOUTH DAILY 30 tablet 0  . budesonide-formoterol (SYMBICORT) 160-4.5 MCG/ACT inhaler Inhale 2 puffs into the lungs 2 (two) times daily. (Patient not taking: Reported on 03/07/2019) 1 Inhaler 1  . fluticasone (FLONASE) 50 MCG/ACT nasal spray One spray in each nostril twice a day, use left hand for right nostril, and right hand for left nostril. (Patient not taking: Reported on 03/07/2019) 48 g 3  . nitrofurantoin, macrocrystal-monohydrate, (MACROBID) 100 MG capsule Take 1 capsule (100 mg total) by mouth at bedtime. (Patient not taking: Reported on 03/07/2019) 30 capsule 0   No current facility-administered medications for this visit.    Allergies  Allergen Reactions  . Clarithromycin Other (See Comments)    Doesn't feel well/ body aches     PDMP not reviewed this encounter. No orders of the defined types were placed in this encounter.  No orders of the defined types were placed in this encounter.

## 2019-03-13 ENCOUNTER — Other Ambulatory Visit: Payer: Self-pay | Admitting: Osteopathic Medicine

## 2019-03-13 DIAGNOSIS — B009 Herpesviral infection, unspecified: Secondary | ICD-10-CM

## 2019-03-13 MED FILL — VALACYCLOVIR HCL 500 MG TAB: 500 | 30 days supply | Qty: 30 | Fill #0

## 2019-03-13 NOTE — Telephone Encounter (Signed)
Requested medication (s) are due for refill today: yes  Requested medication (s) are on the active medication list: yes  Last refill:  02/10/2019  Future visit scheduled: yes  Notes to clinic:  Review for refill   Requested Prescriptions  Pending Prescriptions Disp Refills   valACYclovir (VALTREX) 500 MG tablet [Pharmacy Med Name: VALACYCLOVIR HCL 500 MG TAB 500 TAB] 30 tablet 0    Sig: TAKE 1 TABLET BY MOUTH DAILY     Antimicrobials:  Antiviral Agents - Anti-Herpetic Failed - 03/13/2019  7:37 AM      Failed - Valid encounter within last 12 months    Recent Outpatient Visits          6 days ago Acute non-recurrent maxillary sinusitis   [place] [facility], Lanelle Bal, DO   1 month ago Reactive airway disease that is not asthma   [place] [facility] [place], Lanelle Bal, DO   2 months ago Nephrolithiasis   [facility], Lanelle Bal, DO   3 months ago Gastroesophageal reflux disease, esophagitis presence not specified   [place] [facility], Lanelle Bal, DO   8 months ago Viral URI with cough   [facility], [place], DO

## 2019-03-31 MED FILL — LEVOTHYROXINE 50 MCG TABLET: 50 | 30 days supply | Qty: 30 | Fill #2

## 2019-04-14 ENCOUNTER — Other Ambulatory Visit: Payer: Self-pay | Admitting: Osteopathic Medicine

## 2019-04-14 DIAGNOSIS — I10 Essential (primary) hypertension: Secondary | ICD-10-CM

## 2019-04-14 MED FILL — LISINOPRIL-HCTZ 20-25 MG TA: 20-25 | 90 days supply | Qty: 90 | Fill #0

## 2019-04-16 ENCOUNTER — Other Ambulatory Visit: Payer: Self-pay | Admitting: Osteopathic Medicine

## 2019-04-16 DIAGNOSIS — B009 Herpesviral infection, unspecified: Secondary | ICD-10-CM

## 2019-04-16 MED FILL — VALACYCLOVIR HCL 500 MG TAB: 500 | 30 days supply | Qty: 30 | Fill #0

## 2019-04-18 MED FILL — ATORVASTATIN 20 MG TABLET: 20 | 90 days supply | Qty: 90 | Fill #1

## 2019-04-21 ENCOUNTER — Other Ambulatory Visit: Payer: Self-pay | Admitting: Osteopathic Medicine

## 2019-04-21 MED FILL — PANTOPRAZOLE SOD DR 40 MG T: 40 | 60 days supply | Qty: 120 | Fill #0

## 2019-04-29 ENCOUNTER — Other Ambulatory Visit: Payer: Self-pay | Admitting: Osteopathic Medicine

## 2019-04-29 DIAGNOSIS — E034 Atrophy of thyroid (acquired): Secondary | ICD-10-CM

## 2019-04-29 MED FILL — LEVOTHYROXINE 50 MCG TABLET: 50 | 30 days supply | Qty: 30 | Fill #0

## 2019-05-19 MED FILL — VALACYCLOVIR HCL 500 MG TAB: 500 | 30 days supply | Qty: 30 | Fill #1

## 2019-05-26 MED FILL — LARISSIA 0.1-20 MG-MCG TABS: 0.1-20 | 84 days supply | Qty: 84 | Fill #1

## 2019-05-29 MED FILL — LEVOTHYROXINE 50 MCG TABLET: 50 | 30 days supply | Qty: 30 | Fill #1

## 2019-06-16 ENCOUNTER — Other Ambulatory Visit: Payer: Self-pay | Admitting: Osteopathic Medicine

## 2019-06-16 DIAGNOSIS — E034 Atrophy of thyroid (acquired): Secondary | ICD-10-CM

## 2019-06-16 DIAGNOSIS — R7303 Prediabetes: Secondary | ICD-10-CM

## 2019-06-16 DIAGNOSIS — I10 Essential (primary) hypertension: Secondary | ICD-10-CM

## 2019-06-17 ENCOUNTER — Telehealth: Payer: Self-pay

## 2019-06-17 NOTE — Telephone Encounter (Signed)
Pt left a vm msg requesting a call back regarding her appt on 06/18/19. Contacted pt, she is unable to keep scheduled appt. She did not complete lab order and will be taking her son for cancer treatment. Pt will call clinic for rescheduling appt. Aware that provider has limited appts available. No other inquiries.

## 2019-06-18 ENCOUNTER — Ambulatory Visit: Payer: 59 | Admitting: Osteopathic Medicine

## 2019-06-24 ENCOUNTER — Other Ambulatory Visit: Payer: Self-pay | Admitting: Osteopathic Medicine

## 2019-06-24 DIAGNOSIS — B009 Herpesviral infection, unspecified: Secondary | ICD-10-CM

## 2019-06-24 MED FILL — VALACYCLOVIR HCL 500 MG TAB: 500 | 30 days supply | Qty: 30 | Fill #0

## 2019-06-24 MED FILL — PANTOPRAZOLE SOD DR 40 MG T: 40 | 60 days supply | Qty: 120 | Fill #0

## 2019-06-24 NOTE — Telephone Encounter (Signed)
Requested medication (s) are due for refill today: yes  Requested medication (s) are on the active medication list: yes  Last refill:  05/19/2019  Future visit scheduled: no  Notes to clinic:  review for refill   Requested Prescriptions  Pending Prescriptions Disp Refills   valACYclovir (VALTREX) 500 MG tablet [Pharmacy Med Name: VALACYCLOVIR HCL 500 MG TAB 500 TAB] 30 tablet 1    Sig: TAKE 1 TABLET BY MOUTH DAILY      Antimicrobials:  Antiviral Agents - Anti-Herpetic Failed - 06/24/2019  7:34 AM      Failed - Valid encounter within last 12 months    Recent Outpatient Visits           3 months ago Acute non-recurrent maxillary sinusitis   [place] [facility], Lanelle Bal, DO   5 months ago Reactive airway disease that is not asthma   [place] [facility] [place], Lanelle Bal, DO   6 months ago Nephrolithiasis   [facility], Lanelle Bal, DO   6 months ago Gastroesophageal reflux disease, esophagitis presence not specified   [place] [facility], [place], DO   12 months ago Viral URI with cough   [place] [facility], Lanelle Bal, DO                pantoprazole (PROTONIX) 40 MG tablet [Pharmacy Med Name: PANTOPRAZOLE SOD DR 40 MG T 40 TAB] 120 tablet 0    Sig: TAKE 1 TABLET BY MOUTH 2 TIMES DAILY FOR 8 WEEKS      Gastroenterology: Proton Pump Inhibitors Failed - 06/24/2019  7:34 AM      Failed - Valid encounter within last 12 months    Recent Outpatient Visits           3 months ago Acute non-recurrent maxillary sinusitis   [place] [facility], Lanelle Bal, DO   5 months ago Reactive airway disease that is not asthma   [place] [facility] [place], Lanelle Bal, DO   6 months ago Nephrolithiasis   [facility], Lanelle Bal, DO   6 months ago Gastroesophageal reflux disease, esophagitis presence not specified    [facility], Lanelle Bal, DO   12 months ago Viral URI with cough   [facility], [place], DO

## 2019-06-30 MED FILL — LEVOTHYROXINE 50 MCG TABLET: 50 | 30 days supply | Qty: 30 | Fill #2

## 2019-07-08 ENCOUNTER — Other Ambulatory Visit: Payer: Self-pay | Admitting: Osteopathic Medicine

## 2019-07-08 DIAGNOSIS — I10 Essential (primary) hypertension: Secondary | ICD-10-CM

## 2019-07-08 MED FILL — LISINOPRIL-HCTZ 20-25 MG TA: 20-25 | 90 days supply | Qty: 90 | Fill #0

## 2019-07-08 NOTE — Telephone Encounter (Signed)
Needs appointment

## 2019-07-08 NOTE — Telephone Encounter (Signed)
Requested medication (s) are due for refill today: yes  Requested medication (s) are on the active medication list: yes  Last refill:  04/14/2019  Future visit scheduled: no  Notes to clinic:  review for refill   Requested Prescriptions  Pending Prescriptions Disp Refills   lisinopril-hydrochlorothiazide (ZESTORETIC) 20-25 MG tablet [Pharmacy Med Name: LISINOPRIL-HCTZ 20-25 MG TA 20-25 Tablet] 90 tablet 0    Sig: TAKE 1 TABLET BY MOUTH DAILY.      Cardiovascular:  ACEI + Diuretic Combos Failed - 07/08/2019  7:52 AM      Failed - Valid encounter within last 6 months    Recent Outpatient Visits           4 months ago Acute non-recurrent maxillary sinusitis   [place] [facility], Lanelle Bal, DO   5 months ago Reactive airway disease that is not asthma   [place] [facility], Lanelle Bal, DO   6 months ago Nephrolithiasis   [place] [facility], Lanelle Bal, DO   7 months ago Gastroesophageal reflux disease, esophagitis presence not specified   [place] [facility], Lanelle Bal, DO   1 year ago Viral URI with cough   [place] [facility] [place], DO              Passed - Na in normal range and within 180 days    Sodium  Date Value Ref Range Status  01/28/2019 140 135 - 145 mmol/L Final          Passed - K in normal range and within 180 days    Potassium  Date Value Ref Range Status  01/28/2019 4.0 3.5 - 5.1 mmol/L Final          Passed - Cr in normal range and within 180 days    Creat  Date Value Ref Range Status  07/17/2018 0.86 0.50 - 1.10 mg/dL Final   Creatinine, Ser  Date Value Ref Range Status  01/28/2019 0.90 0.44 - 1.00 mg/dL Final          Passed - Ca in normal range and within 180 days    Calcium  Date Value Ref Range Status  12/30/2018 8.9 8.9 - 10.3 mg/dL Final   Calcium,  Ion  Date Value Ref Range Status  01/28/2019 1.24 1.15 - 1.40 mmol/L Final          Passed - Patient is not pregnant      Passed - Last BP in normal range    BP Readings from Last 1 Encounters:  01/28/19 126/70

## 2019-07-14 ENCOUNTER — Other Ambulatory Visit: Payer: Self-pay | Admitting: Osteopathic Medicine

## 2019-07-14 DIAGNOSIS — N200 Calculus of kidney: Secondary | ICD-10-CM

## 2019-07-14 NOTE — Telephone Encounter (Signed)
Saw patient at work!  C/o frequency and dysuria for 3 days  I tried to send abx but cant e-rx? Please send! Thanks!

## 2019-07-15 ENCOUNTER — Telehealth: Payer: Self-pay | Admitting: Osteopathic Medicine

## 2019-07-15 MED ORDER — NITROFURANTOIN MONOHYD MACRO 100 MG PO CAPS: 100.0000 mg | ORAL_CAPSULE | Freq: Two times a day (BID) | ORAL | 0 refills | Status: DC

## 2019-07-15 MED FILL — NITROFURANTOIN MONO-MCR 100: 100 | 5 days supply | Qty: 10 | Fill #0

## 2019-07-15 NOTE — Telephone Encounter (Signed)
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

## 2019-07-15 NOTE — Telephone Encounter (Signed)
Med sent and culture order sent to lab

## 2019-07-16 ENCOUNTER — Ambulatory Visit: Payer: 59 | Admitting: Medical-Surgical

## 2019-07-21 ENCOUNTER — Other Ambulatory Visit: Payer: Self-pay | Admitting: Osteopathic Medicine

## 2019-07-21 MED FILL — ATORVASTATIN CALCIUM 20 MG: 20 | 90 days supply | Qty: 90 | Fill #0

## 2019-07-28 ENCOUNTER — Other Ambulatory Visit: Payer: Self-pay | Admitting: Osteopathic Medicine

## 2019-07-28 DIAGNOSIS — E034 Atrophy of thyroid (acquired): Secondary | ICD-10-CM

## 2019-07-28 MED FILL — LEVOTHYROXINE 50 MCG TABLET: 50 | 30 days supply | Qty: 30 | Fill #0

## 2019-07-28 MED FILL — VALACYCLOVIR HCL 500 MG TAB: 500 | 30 days supply | Qty: 30 | Fill #1

## 2019-07-28 NOTE — Telephone Encounter (Signed)
[facility] pharmacy requesting med refill for levothyroxine. Pt has been contacted - informed labs have been ordered for her to complete. Aware #30 will be sent to the pharmacy. Pt was agreeable with plan. No other inquiries during call.

## 2019-08-06 DIAGNOSIS — E034 Atrophy of thyroid (acquired): Secondary | ICD-10-CM | POA: Diagnosis not present

## 2019-08-06 DIAGNOSIS — I10 Essential (primary) hypertension: Secondary | ICD-10-CM | POA: Diagnosis not present

## 2019-08-06 DIAGNOSIS — R7303 Prediabetes: Secondary | ICD-10-CM | POA: Diagnosis not present

## 2019-08-07 LAB — COMPLETE METABOLIC PANEL WITH GFR
AG Ratio: 2.1 (calc) (ref 1.0–2.5)
ALT: 19 U/L (ref 6–29)
AST: 17 U/L (ref 10–35)
Albumin: 4.2 g/dL (ref 3.6–5.1)
Alkaline phosphatase (APISO): 59 U/L (ref 31–125)
BUN: 17 mg/dL (ref 7–25)
CO2: 23 mmol/L (ref 20–32)
Calcium: 9.1 mg/dL (ref 8.6–10.2)
Chloride: 102 mmol/L (ref 98–110)
Creat: 0.84 mg/dL (ref 0.50–1.10)
GFR, Est African American: 97 mL/min/{1.73_m2} (ref >=60)
GFR, Est Non African American: 84 mL/min/{1.73_m2} (ref >=60)
Globulin: 2 g/dL (calc) (ref 1.9–3.7)
Glucose, Bld: 105 mg/dL — ABNORMAL HIGH (ref 65–99)
Potassium: 3.8 mmol/L (ref 3.5–5.3)
Sodium: 136 mmol/L (ref 135–146)
Total Bilirubin: 0.5 mg/dL (ref 0.2–1.2)
Total Protein: 6.2 g/dL (ref 6.1–8.1)

## 2019-08-07 LAB — HEMOGLOBIN A1C
Hgb A1c MFr Bld: 5.9 % of total Hgb — ABNORMAL HIGH (ref <5.7)
Mean Plasma Glucose: 123 (calc)
eAG (mmol/L): 6.8 (calc)

## 2019-08-07 LAB — LIPID PANEL
Cholesterol: 230 mg/dL — ABNORMAL HIGH (ref <200)
HDL: 54 mg/dL (ref >=50)
LDL Cholesterol (Calc): 144 mg/dL (calc) — ABNORMAL HIGH
Non-HDL Cholesterol (Calc): 176 mg/dL (calc) — ABNORMAL HIGH (ref <130)
Total CHOL/HDL Ratio: 4.3 (calc) (ref <5.0)
Triglycerides: 187 mg/dL — ABNORMAL HIGH (ref <150)

## 2019-08-07 LAB — TSH: TSH: 2.65 mIU/L

## 2019-08-08 ENCOUNTER — Other Ambulatory Visit: Payer: Self-pay | Admitting: Osteopathic Medicine

## 2019-08-11 ENCOUNTER — Other Ambulatory Visit: Payer: Self-pay | Admitting: Osteopathic Medicine

## 2019-08-11 MED ORDER — ATORVASTATIN CALCIUM 40 MG PO TABS: 40.0000 mg | ORAL_TABLET | Freq: Every day | ORAL | 3 refills | Status: DC

## 2019-08-12 MED FILL — ATORVASTATIN 40 MG TABLET: 40 | 90 days supply | Qty: 90 | Fill #0

## 2019-08-19 MED FILL — LARISSIA 0.1-20 MG-MCG TABS: 0.1-20 | 84 days supply | Qty: 84 | Fill #2

## 2019-08-25 ENCOUNTER — Other Ambulatory Visit: Payer: Self-pay | Admitting: Osteopathic Medicine

## 2019-08-25 MED FILL — PANTOPRAZOLE SOD DR 40 MG T: 40 | 60 days supply | Qty: 120 | Fill #0

## 2019-08-26 ENCOUNTER — Other Ambulatory Visit: Payer: Self-pay | Admitting: Osteopathic Medicine

## 2019-08-26 DIAGNOSIS — E034 Atrophy of thyroid (acquired): Secondary | ICD-10-CM

## 2019-08-26 MED FILL — LEVOTHYROXINE 50 MCG TABLET: 50 | 60 days supply | Qty: 60 | Fill #0

## 2019-08-26 NOTE — Telephone Encounter (Signed)
Requested medication (s) are due for refill today: yes  Last refill:  07/28/2019  Future visit scheduled: no  Notes to clinic: review for refill   Requested Prescriptions  Pending Prescriptions Disp Refills   levothyroxine (SYNTHROID) 50 MCG tablet [Pharmacy Med Name: LEVOTHYROXINE 50 MCG TABLET 50 Tablet] 30 tablet 0    Sig: TAKE 1 TABLET BY MOUTH DAILY BEFORE BREAKFAST.      Endocrinology:  Hypothyroid Agents Failed - 08/26/2019  7:42 AM      Failed - TSH needs to be rechecked within 3 months after an abnormal result. Refill until TSH is due.      Failed - Valid encounter within last 12 months    Recent Outpatient Visits           5 months ago Acute non-recurrent maxillary sinusitis   [place] [facility], [place], DO   7 months ago Reactive airway disease that is not asthma   [place] [facility], Lanelle Bal, DO   8 months ago Nephrolithiasis   [facility], [place], DO   8 months ago Gastroesophageal reflux disease, esophagitis presence not specified   [place] [facility], Lanelle Bal, DO   1 year ago Viral URI with cough    [facility], Lanelle Bal, DO              Passed - TSH in normal range and within 360 days    TSH  Date Value Ref Range Status  08/06/2019 2.65 mIU/L Final    Comment:              Reference Range .           > or = 20 Years  0.40-4.50 .                Pregnancy Ranges           First trimester    0.26-2.66           Second trimester   0.55-2.73           Third trimester    0.43-2.91

## 2019-09-02 ENCOUNTER — Other Ambulatory Visit: Payer: Self-pay | Admitting: Osteopathic Medicine

## 2019-09-02 DIAGNOSIS — B009 Herpesviral infection, unspecified: Secondary | ICD-10-CM

## 2019-09-02 MED FILL — IPRATROPIUM 0.06% SPRAY: 0.06 | 10 days supply | Qty: 15 | Fill #1

## 2019-09-03 MED FILL — VALACYCLOVIR HCL 500 MG TAB: 500 | 30 days supply | Qty: 30 | Fill #0

## 2019-10-02 ENCOUNTER — Telehealth (INDEPENDENT_AMBULATORY_CARE_PROVIDER_SITE_OTHER): Payer: 59 | Admitting: Medical-Surgical

## 2019-10-02 ENCOUNTER — Encounter: Payer: Self-pay | Admitting: Medical-Surgical

## 2019-10-02 ENCOUNTER — Telehealth: Payer: Self-pay | Admitting: Medical-Surgical

## 2019-10-02 DIAGNOSIS — K219 Gastro-esophageal reflux disease without esophagitis: Secondary | ICD-10-CM

## 2019-10-02 MED ORDER — ESOMEPRAZOLE MAGNESIUM 20 MG PO CPDR: 20.0000 mg | DELAYED_RELEASE_CAPSULE | Freq: Two times a day (BID) | ORAL | 1 refills | Status: DC

## 2019-10-02 NOTE — Progress Notes (Signed)
Virtual Visit via Video Note  I connected with Jillian Lee on 10/02/19 at  8:30 AM EDT by a video enabled telemedicine application and verified that I am speaking with the correct person using two identifiers.   I discussed the limitations of evaluation and management by telemedicine and the availability of in person appointments. The patient expressed understanding and agreed to proceed.  Subjective:    CC: GERD exacerbation  HPI: Pleasant 44 year old female presenting via [facility] video visit with complaints of GERD exacerbation x 4 days.  Currently taking Protonix 40 mg twice daily with no relief.  Reports frequently belching acid, experiencing burning pain in her chest up to her jaw.  Denies nausea, vomiting, diarrhea, constipation, and blood in stool.  Eating small meals throughout the day.  Endorses drinking a lot of Sprite and also drinking 3-4 bottles of angry orchard apple cider every other weekend when off work.  States "I want to take something so I can eat whatever I want".  Previously tried Nexium many years ago and feels this worked well for her.   Past medical history, Surgical history, Family history not pertinant except as noted below, Social history, Allergies, and medications have been entered into the medical record, reviewed, and corrections made.   Review of Systems: No fevers, chills, night sweats, weight loss, chest pain, or shortness of breath.   Objective:    General: Speaking clearly in complete sentences without any shortness of breath.  Alert and oriented x3.  Normal judgment. No apparent acute distress.  Impression and Recommendations:    1. Gastroesophageal reflux disease, unspecified whether esophagitis present Discussed treatment options and expectations for treatment results.  Switching from Protonix to Nexium twice daily.  Discussed the TIF procedure and success rates at reduction of symptoms.  Patient very interested in more information.  Referring to  [facility] gastroenterology for further evaluation.  - esomeprazole (NEXIUM) 20 MG capsule; Take 1 capsule (20 mg total) by mouth in the morning and at bedtime.  Dispense: 60 capsule; Refill: 1 - Ambulatory referral to Gastroenterology  I discussed the assessment and treatment plan with the patient. The patient was provided an opportunity to ask questions and all were answered. The patient agreed with the plan and demonstrated an understanding of the instructions.   The patient was advised to call back or seek an in-person evaluation if the symptoms worsen or if the condition fails to improve as anticipated.  Return in about 4 weeks (around 10/30/2019) for GERD follow up.  Jillian Sorrel, DNP, APRN, FNP-BC [facility]

## 2019-10-02 NOTE — Telephone Encounter (Signed)
Received PA on Esomeprazole sent through cover my meds waiting on determination. - CF

## 2019-10-07 NOTE — Telephone Encounter (Signed)
Received fax from [employer] and they denied coverage on Esomeprazole due to asking to cover a larger amount than allowed under the quantity limit rule  Placing in providers box for further review. - CF

## 2019-10-08 ENCOUNTER — Telehealth: Payer: Self-pay

## 2019-10-08 ENCOUNTER — Other Ambulatory Visit: Payer: Self-pay | Admitting: Osteopathic Medicine

## 2019-10-08 DIAGNOSIS — I10 Essential (primary) hypertension: Secondary | ICD-10-CM

## 2019-10-08 MED FILL — LISINOPRIL-HCTZ 20-25 MG TA: 20-25 | 30 days supply | Qty: 30 | Fill #0

## 2019-10-08 NOTE — Telephone Encounter (Signed)
-----   Message from Samuel Bouche, NP sent at 10/07/2019  3:55 PM EDT ----- Regarding: Omeprazole insurance coverage denial Prior authorization for insurance coverage of esomeprazole denied. Recommend obtaining medication over the counter or using [place] until to GI appointment.

## 2019-10-08 NOTE — Telephone Encounter (Signed)
Pt aware of recommendations regarding OTC med vs. GoodRx. No further questions or concerns at this time.

## 2019-10-13 MED FILL — VALACYCLOVIR HCL 500 MG TAB: 500 | 30 days supply | Qty: 30 | Fill #1

## 2019-10-27 ENCOUNTER — Other Ambulatory Visit: Payer: Self-pay | Admitting: Osteopathic Medicine

## 2019-10-27 ENCOUNTER — Telehealth: Payer: Self-pay

## 2019-10-27 ENCOUNTER — Other Ambulatory Visit: Payer: Self-pay

## 2019-10-27 DIAGNOSIS — E034 Atrophy of thyroid (acquired): Secondary | ICD-10-CM

## 2019-10-27 MED ORDER — PANTOPRAZOLE SODIUM 40 MG PO TBEC: 40.0000 mg | DELAYED_RELEASE_TABLET | Freq: Two times a day (BID) | ORAL | 0 refills | Status: DC

## 2019-10-27 MED FILL — PANTOPRAZOLE SOD DR 40 MG T: 40 | 30 days supply | Qty: 60 | Fill #0

## 2019-10-27 MED FILL — LEVOTHYROXINE 50 MCG TABLET: 50 | 60 days supply | Qty: 60 | Fill #0

## 2019-10-27 NOTE — Telephone Encounter (Addendum)
Pt left a vm msg stating she no longer wants to take omeprazole. Requesting pantoprazole. Pls review previous telephone encounter for additional information.

## 2019-10-28 ENCOUNTER — Other Ambulatory Visit: Payer: Self-pay | Admitting: Osteopathic Medicine

## 2019-10-28 MED ORDER — PANTOPRAZOLE SODIUM 40 MG PO TBEC: 40.0000 mg | DELAYED_RELEASE_TABLET | Freq: Two times a day (BID) | ORAL | 3 refills | Status: DC

## 2019-11-10 ENCOUNTER — Other Ambulatory Visit: Payer: Self-pay | Admitting: Osteopathic Medicine

## 2019-11-10 DIAGNOSIS — I10 Essential (primary) hypertension: Secondary | ICD-10-CM

## 2019-11-10 MED FILL — ATORVASTATIN 40 MG TABLET: 40 | 90 days supply | Qty: 90 | Fill #1

## 2019-11-10 MED FILL — LISINOPRIL-HCTZ 20-25 MG TA: 20-25 | 30 days supply | Qty: 30 | Fill #0

## 2019-11-18 ENCOUNTER — Other Ambulatory Visit: Payer: Self-pay | Admitting: Osteopathic Medicine

## 2019-11-18 DIAGNOSIS — B009 Herpesviral infection, unspecified: Secondary | ICD-10-CM

## 2019-11-27 MED FILL — LARISSIA 0.1-20 MG-MCG TABS: 0.1-20 | 84 days supply | Qty: 84 | Fill #3

## 2019-12-01 ENCOUNTER — Other Ambulatory Visit: Payer: Self-pay | Admitting: Medical-Surgical

## 2019-12-01 MED FILL — PANTOPRAZOLE SOD DR 40 MG T: 40 | 90 days supply | Qty: 180 | Fill #0

## 2019-12-01 NOTE — Telephone Encounter (Signed)
Routing to PCP

## 2019-12-05 ENCOUNTER — Other Ambulatory Visit: Payer: Self-pay | Admitting: Osteopathic Medicine

## 2019-12-05 DIAGNOSIS — I10 Essential (primary) hypertension: Secondary | ICD-10-CM

## 2019-12-10 ENCOUNTER — Other Ambulatory Visit: Payer: Self-pay | Admitting: Osteopathic Medicine

## 2019-12-10 ENCOUNTER — Telehealth: Payer: Self-pay

## 2019-12-10 DIAGNOSIS — I10 Essential (primary) hypertension: Secondary | ICD-10-CM

## 2019-12-10 MED FILL — LISINOPRIL-HCTZ 20-25 MG TA: 20-25 | 15 days supply | Qty: 15 | Fill #0

## 2019-12-10 NOTE — Telephone Encounter (Signed)
Please call pt to schedule appt.  No further refills until pt is seen.  Charyl Bigger, CMA

## 2019-12-11 NOTE — Telephone Encounter (Signed)
Patient scheduled.

## 2019-12-22 ENCOUNTER — Other Ambulatory Visit: Payer: Self-pay | Admitting: Osteopathic Medicine

## 2019-12-22 DIAGNOSIS — I10 Essential (primary) hypertension: Secondary | ICD-10-CM

## 2019-12-22 DIAGNOSIS — E034 Atrophy of thyroid (acquired): Secondary | ICD-10-CM

## 2019-12-22 MED FILL — VALACYCLOVIR HCL 500 MG TAB: 500 | 30 days supply | Qty: 30 | Fill #1

## 2019-12-26 ENCOUNTER — Encounter: Payer: Self-pay | Admitting: Osteopathic Medicine

## 2019-12-26 ENCOUNTER — Ambulatory Visit (INDEPENDENT_AMBULATORY_CARE_PROVIDER_SITE_OTHER): Payer: 59 | Admitting: Osteopathic Medicine

## 2019-12-26 ENCOUNTER — Other Ambulatory Visit: Payer: Self-pay | Admitting: Osteopathic Medicine

## 2019-12-26 ENCOUNTER — Other Ambulatory Visit: Payer: Self-pay

## 2019-12-26 VITALS — BP 143/85 | HR 78 | Temp 98.0°F | Wt 131.0 lb

## 2019-12-26 DIAGNOSIS — Z3041 Encounter for surveillance of contraceptive pills: Secondary | ICD-10-CM

## 2019-12-26 DIAGNOSIS — Z8639 Personal history of other endocrine, nutritional and metabolic disease: Secondary | ICD-10-CM | POA: Diagnosis not present

## 2019-12-26 DIAGNOSIS — E785 Hyperlipidemia, unspecified: Secondary | ICD-10-CM

## 2019-12-26 DIAGNOSIS — R7303 Prediabetes: Secondary | ICD-10-CM | POA: Diagnosis not present

## 2019-12-26 DIAGNOSIS — K219 Gastro-esophageal reflux disease without esophagitis: Secondary | ICD-10-CM | POA: Diagnosis not present

## 2019-12-26 DIAGNOSIS — Z1231 Encounter for screening mammogram for malignant neoplasm of breast: Secondary | ICD-10-CM

## 2019-12-26 DIAGNOSIS — E034 Atrophy of thyroid (acquired): Secondary | ICD-10-CM | POA: Diagnosis not present

## 2019-12-26 DIAGNOSIS — I10 Essential (primary) hypertension: Secondary | ICD-10-CM

## 2019-12-26 DIAGNOSIS — Z0000 Encounter for general adult medical examination without abnormal findings: Secondary | ICD-10-CM

## 2019-12-26 DIAGNOSIS — B009 Herpesviral infection, unspecified: Secondary | ICD-10-CM | POA: Diagnosis not present

## 2019-12-26 DIAGNOSIS — Z1211 Encounter for screening for malignant neoplasm of colon: Secondary | ICD-10-CM

## 2019-12-26 MED ORDER — LEVONORGESTREL-ETHINYL ESTRAD 0.1-20 MG-MCG PO TABS: 1.0000 | ORAL_TABLET | Freq: Every day | ORAL | 3 refills | Status: DC

## 2019-12-26 MED ORDER — FERROUS SULFATE 325 (65 FE) MG PO TABS: 325.0000 mg | ORAL_TABLET | Freq: Every day | ORAL | 3 refills | Status: AC

## 2019-12-26 MED ORDER — VALACYCLOVIR HCL 500 MG PO TABS: 500.0000 mg | ORAL_TABLET | Freq: Every day | ORAL | 3 refills | Status: DC

## 2019-12-26 MED ORDER — LEVOTHYROXINE SODIUM 50 MCG PO TABS: 50.0000 ug | ORAL_TABLET | Freq: Every day | ORAL | 3 refills | Status: DC

## 2019-12-26 MED ORDER — ATORVASTATIN CALCIUM 40 MG PO TABS: 40.0000 mg | ORAL_TABLET | Freq: Every day | ORAL | 3 refills | Status: DC

## 2019-12-26 MED ORDER — LISINOPRIL-HYDROCHLOROTHIAZIDE 20-25 MG PO TABS: 1.0000 | ORAL_TABLET | Freq: Every day | ORAL | 3 refills | Status: DC

## 2019-12-26 MED ORDER — ESOMEPRAZOLE MAGNESIUM 20 MG PO CPDR: 20.0000 mg | DELAYED_RELEASE_CAPSULE | Freq: Two times a day (BID) | ORAL | 3 refills | Status: DC

## 2019-12-26 MED FILL — LISINOPRIL-HCTZ 20-25 MG TA: 20-25 | 90 days supply | Qty: 90 | Fill #0

## 2019-12-26 MED FILL — LEVOTHYROXINE 50 MCG TABLET: 50 | 90 days supply | Qty: 90 | Fill #0

## 2019-12-26 NOTE — Patient Instructions (Signed)
General Preventive Care  Most recent routine screening labs: ordered today.   Blood pressure goal 130/80 or less.   Tobacco: don't!   Alcohol: responsible moderation is ok for most adults - if you have concerns about your alcohol intake, please talk to me!   Exercise: as tolerated to reduce risk of cardiovascular disease and diabetes. Strength training will also prevent osteoporosis.   Mental health: if need for mental health care (medicines, counseling, other), or concerns about moods, please let me know!   Sexual / Reproductive health: if need for STD testing, or if concerns with libido/pain problems, please let me know!  Advanced Directive: Living Will and/or Healthcare Power of Attorney recommended for all adults, regardless of age or health.  Vaccines  Flu vaccine: for almost everyone, every fall.   Shingles vaccine: after age 53.   Pneumonia vaccines: after age 3  Tetanus booster: every 10 years / 3rd trimester of pregnancy  COVID vaccine: STRONGLY RECOMMENDED  Cancer screenings   Colon cancer screening: for everyone age 41-75. Due now! Ordered  Breast cancer screening: mammogram due now! Ordered   Cervical cancer screening: Pap due 04/2022  Lung cancer screening: not needed for non-smokers  Infection screenings  . HIV: recommended screening at least once age 9-65 . Gonorrhea/Chlamydia: screening as needed . Hepatitis C: recommended once for everyone age 81-75 . TB: certain at-risk populations, or depending on work requirements and/or travel history Other . Bone Density Test: recommended for women at age 60

## 2019-12-26 NOTE — Progress Notes (Signed)
Jillian Lee is a 44 y.o. female who presents to  [facility] [place] [facility]  today, 12/26/19, seeking care for the following: . ANNUAL PHYSICAL      ASSESSMENT & PLAN with other pertinent history/findings:  The primary encounter diagnosis was Annual physical exam. Diagnoses of Essential hypertension, benign, Prediabetes, Gastroesophageal reflux disease, unspecified whether esophagitis present, Hyperlipidemia, unspecified hyperlipidemia type, Hypothyroidism due to acquired atrophy of thyroid, HSV infection, Encounter for surveillance of contraceptive pills, H/O iron deficiency, Breast cancer screening by mammogram, and Colon cancer screening were also pertinent to this visit.  Detailed PE normal  Ordered cologuard, mammo Gave info on COVID vaccine  Pt to recheck BP at work (in hospital)    Patient Instructions  General Preventive Care  Most recent routine screening labs: ordered today.   Blood pressure goal 130/80 or less.   Tobacco: don't!   Alcohol: responsible moderation is ok for most adults - if you have concerns about your alcohol intake, please talk to me!   Exercise: as tolerated to reduce risk of cardiovascular disease and diabetes. Strength training will also prevent osteoporosis.   Mental health: if need for mental health care (medicines, counseling, other), or concerns about moods, please let me know!   Sexual / Reproductive health: if need for STD testing, or if concerns with libido/pain problems, please let me know!  Advanced Directive: Living Will and/or Healthcare Power of Attorney recommended for all adults, regardless of age or health.  Vaccines  Flu vaccine: for almost everyone, every fall.   Shingles vaccine: after age 30.   Pneumonia vaccines: after age 68  Tetanus booster: every 10 years / 3rd trimester of pregnancy  COVID vaccine: STRONGLY RECOMMENDED  Cancer screenings   Colon cancer screening: for  everyone age 39-75. Due now! Ordered  Breast cancer screening: mammogram due now! Ordered   Cervical cancer screening: Pap due 04/2022  Lung cancer screening: not needed for non-smokers  Infection screenings  . HIV: recommended screening at least once age 68-65 . Gonorrhea/Chlamydia: screening as needed . Hepatitis C: recommended once for everyone age 67-75 . TB: certain at-risk populations, or depending on work requirements and/or travel history Other . Bone Density Test: recommended for women at age 33    Orders Placed This Encounter  Procedures  . MM 3D SCREEN BREAST BILATERAL  . CBC  . COMPLETE METABOLIC PANEL WITH GFR  . Lipid panel  . TSH  . Hemoglobin A1c  . Cologuard    Meds ordered this encounter  Medications  . lisinopril-hydrochlorothiazide (ZESTORETIC) 20-25 MG tablet    Sig: Take 1 tablet by mouth daily. Due for follow up    Dispense:  90 tablet    Refill:  3  . valACYclovir (VALTREX) 500 MG tablet    Sig: Take 1 tablet (500 mg total) by mouth daily.    Dispense:  90 tablet    Refill:  3  . levonorgestrel-ethinyl estradiol (LARISSIA) 0.1-20 MG-MCG tablet    Sig: Take 1 tablet by mouth daily.    Dispense:  84 tablet    Refill:  3  . ferrous sulfate 325 (65 FE) MG tablet    Sig: Take 1 tablet (325 mg total) by mouth daily.    Dispense:  90 tablet    Refill:  3  . esomeprazole (NEXIUM) 20 MG capsule    Sig: Take 1 capsule (20 mg total) by mouth in the morning and at bedtime.    Dispense:  180 capsule    Refill:  3  . levothyroxine (SYNTHROID) 50 MCG tablet    Sig: Take 1 tablet (50 mcg total) by mouth daily before breakfast.    Dispense:  90 tablet    Refill:  3  . atorvastatin (LIPITOR) 40 MG tablet    Sig: Take 1 tablet (40 mg total) by mouth daily.    Dispense:  90 tablet    Refill:  3    Replaces 20 mg dose    .         Follow-up instructions: Return in about 6 months (around 06/26/2020) for IN-OFFICE VISIT MONITOR BLOOD PRESSURE  .                                         BP (!) 143/85 (BP Location: Right Arm, Patient Position: Sitting, Cuff Size: Normal)   Pulse 78   Temp 98 F (36.7 C) (Oral)   Wt 131 lb (59.4 kg)   BMI 28.35 kg/m   Current Meds  Medication Sig  . Ascorbic Acid (VITAMIN C) 1000 MG tablet Take 1,000 mg by mouth daily with lunch. W/Rose Hips  . aspirin 81 MG chewable tablet Chew 1 tablet (81 mg total) by mouth daily.  Marland Kitchen atorvastatin (LIPITOR) 40 MG tablet Take 1 tablet (40 mg total) by mouth daily.  . [facility] MAXIMUM REDNESS RELIEF 0.03-0.5 % SOLN Place 1 drop into both eyes 3 (three) times daily.  Marland Kitchen esomeprazole (NEXIUM) 20 MG capsule Take 1 capsule (20 mg total) by mouth in the morning and at bedtime.  . ferrous sulfate 325 (65 FE) MG tablet Take 1 tablet (325 mg total) by mouth daily.  Marland Kitchen ipratropium (ATROVENT) 0.06 % nasal spray Place 2 sprays into both nostrils 4 (four) times daily. As needed for sinus congestion  . levonorgestrel-ethinyl estradiol (LARISSIA) 0.1-20 MG-MCG tablet Take 1 tablet by mouth daily.  Marland Kitchen levothyroxine (SYNTHROID) 50 MCG tablet Take 1 tablet (50 mcg total) by mouth daily before breakfast.  . lisinopril-hydrochlorothiazide (ZESTORETIC) 20-25 MG tablet Take 1 tablet by mouth daily. Due for follow up  . Multiple Vitamins-Minerals (MULTIVITAMIN WITH MINERALS) tablet Take 1 tablet by mouth daily with lunch.   . nitroGLYCERIN (NITROSTAT) 0.3 MG SL tablet Place 1 tablet (0.3 mg total) under the tongue every 5 (five) minutes as needed for chest pain.  . pantoprazole (PROTONIX) 40 MG tablet Take 1 tablet (40 mg total) by mouth daily.  . tamsulosin (FLOMAX) 0.4 MG CAPS capsule Take 1 capsule (0.4 mg total) by mouth daily. Until passage of stone, max use 4 weeks  . valACYclovir (VALTREX) 500 MG tablet Take 1 tablet (500 mg total) by mouth daily.  . [DISCONTINUED] atorvastatin (LIPITOR) 40 MG tablet Take 1 tablet (40 mg total) by mouth daily.  .  [DISCONTINUED] esomeprazole (NEXIUM) 20 MG capsule Take 1 capsule (20 mg total) by mouth in the morning and at bedtime.  . [DISCONTINUED] ferrous sulfate 325 (65 FE) MG tablet Take 1 tablet (325 mg total) by mouth daily with breakfast. (Patient taking differently: Take 325 mg by mouth daily with lunch. )  . [DISCONTINUED] LARISSIA 0.1-20 MG-MCG tablet TAKE 1 TABLET BY MOUTH DAILY.  . [DISCONTINUED] levothyroxine (SYNTHROID) 50 MCG tablet TAKE 1 TABLET BY MOUTH DAILY BEFORE BREAKFAST.  . [DISCONTINUED] lisinopril-hydrochlorothiazide (ZESTORETIC) 20-25 MG tablet TAKE 1 TABLET BY MOUTH DAILY. DUE FOR FOLLOW UP  . [DISCONTINUED]  valACYclovir (VALTREX) 500 MG tablet TAKE 1 TABLET BY MOUTH DAILY    No results found for this or any previous visit (from the past 72 hour(s)).  No results found.  Depression screen [facility] 2/9 12/16/2018 12/04/2018 06/28/2018  Decreased Interest 0 0 0  Down, Depressed, Hopeless 0 0 0  PHQ - 2 Score 0 0 0  Altered sleeping - - 0  Tired, decreased energy - - 1  Change in appetite - - 0  Feeling bad or failure about yourself  - - 0  Trouble concentrating - - 0  Moving slowly or fidgety/restless - - 0  Suicidal thoughts - - 0  PHQ-9 Score - - 1  Difficult doing work/chores - - Not difficult at all    GAD 7 : Generalized Anxiety Score 12/16/2018 12/04/2018 06/28/2018 11/02/2017  Nervous, Anxious, on Edge 0 1 0 0  Control/stop worrying 1 1 0 0  Worry too much - different things 1 1 1  0  Trouble relaxing 0 0 0 0  Restless 0 0 0 0  Easily annoyed or irritable 2 3 1  0  Afraid - awful might happen 0 0 0 0  Total GAD 7 Score 4 6 2  0  Anxiety Difficulty Not difficult at all Not difficult at all Somewhat difficult Not difficult at all      All questions at time of visit were answered - patient instructed to contact office with any additional concerns or updates.  ER/RTC precautions were reviewed with the patient.  Please note: voice recognition software was used to produce  this document, and typos may escape review. Please contact Dr. Sheppard Coil for any needed clarifications.

## 2019-12-29 ENCOUNTER — Telehealth: Payer: Self-pay

## 2019-12-29 ENCOUNTER — Encounter: Payer: Self-pay | Admitting: Gastroenterology

## 2019-12-29 DIAGNOSIS — K219 Gastro-esophageal reflux disease without esophagitis: Secondary | ICD-10-CM

## 2019-12-29 MED ORDER — ESOMEPRAZOLE MAGNESIUM 40 MG PO CPDR: 40.0000 mg | DELAYED_RELEASE_CAPSULE | Freq: Every day | ORAL | 3 refills | Status: DC

## 2019-12-29 NOTE — Telephone Encounter (Signed)
Per [employer] will only cover 1 cap/daily for esomeprazole. Can provider change rx to 40 mg instead?" Pls advise, thanks.

## 2020-01-14 ENCOUNTER — Other Ambulatory Visit: Payer: Self-pay

## 2020-01-14 ENCOUNTER — Ambulatory Visit (INDEPENDENT_AMBULATORY_CARE_PROVIDER_SITE_OTHER): Payer: 59

## 2020-01-14 DIAGNOSIS — Z0000 Encounter for general adult medical examination without abnormal findings: Secondary | ICD-10-CM | POA: Diagnosis not present

## 2020-01-14 DIAGNOSIS — Z1231 Encounter for screening mammogram for malignant neoplasm of breast: Secondary | ICD-10-CM | POA: Diagnosis not present

## 2020-01-14 DIAGNOSIS — Z1212 Encounter for screening for malignant neoplasm of rectum: Secondary | ICD-10-CM | POA: Diagnosis not present

## 2020-01-14 DIAGNOSIS — Z1211 Encounter for screening for malignant neoplasm of colon: Secondary | ICD-10-CM | POA: Diagnosis not present

## 2020-01-15 LAB — COLOGUARD: Cologuard: NEGATIVE

## 2020-01-20 ENCOUNTER — Encounter: Payer: Self-pay | Admitting: Osteopathic Medicine

## 2020-01-20 LAB — COLOGUARD
COLOGUARD: NEGATIVE
Cologuard: NEGATIVE

## 2020-01-21 ENCOUNTER — Telehealth: Payer: Self-pay | Admitting: Family Medicine

## 2020-01-21 NOTE — Telephone Encounter (Signed)
Call patient: Cologuard test was negative.  Repeat colon cancer screening in 3 years.

## 2020-01-22 ENCOUNTER — Telehealth: Payer: Self-pay

## 2020-01-22 NOTE — Telephone Encounter (Signed)
Colon cancer screening results given to patient. Verbalized her understanding. No questions concerns.

## 2020-01-22 NOTE — Telephone Encounter (Signed)
cologaurd results given to patient.

## 2020-01-26 MED FILL — VALACYCLOVIR HCL 500 MG TAB: 500 | 90 days supply | Qty: 90 | Fill #0

## 2020-02-02 MED FILL — ATORVASTATIN 40 MG TABLET: 40 | 90 days supply | Qty: 90 | Fill #2

## 2020-02-03 ENCOUNTER — Ambulatory Visit: Payer: 59 | Attending: Internal Medicine

## 2020-02-03 DIAGNOSIS — Z23 Encounter for immunization: Secondary | ICD-10-CM

## 2020-02-03 NOTE — Progress Notes (Signed)
   Covid-19 Vaccination Clinic  Name:  Jillian Lee    MRN: [id number] DOB: 01-Jul-1976  02/03/2020  Jillian Lee was observed post Covid-19 immunization for 15 minutes without incident. She was provided with Vaccine Information Sheet and instruction to access the V-Safe system.   Jillian Lee was instructed to call 911 with any severe reactions post vaccine: Marland Kitchen Difficulty breathing  . Swelling of face and throat  . A fast heartbeat  . A bad rash all over body  . Dizziness and weakness   Immunizations Administered    Name Date Dose VIS Date Route   Pfizer COVID-19 Vaccine 02/03/2020 12:57 PM 0.3 mL 09/03/2018 Intramuscular   Manufacturer: [place]   Lot: [id number]   [place]: [id number]

## 2020-02-24 ENCOUNTER — Ambulatory Visit: Payer: 59 | Attending: Internal Medicine

## 2020-02-24 DIAGNOSIS — Z23 Encounter for immunization: Secondary | ICD-10-CM

## 2020-02-24 NOTE — Progress Notes (Signed)
   Covid-19 Vaccination Clinic  Name:  Jillian Lee    MRN: [id number] DOB: 12-07-1975  02/24/2020  Jillian Lee was observed post Covid-19 immunization for 15 minutes without incident. She was provided with Vaccine Information Sheet and instruction to access the V-Safe system.   Jillian Lee was instructed to call 911 with any severe reactions post vaccine: Marland Kitchen Difficulty breathing  . Swelling of face and throat  . A fast heartbeat  . A bad rash all over body  . Dizziness and weakness   Immunizations Administered    Name Date Dose VIS Date Route   Pfizer COVID-19 Vaccine 02/24/2020  3:35 PM 0.3 mL 09/03/2018 Intramuscular   Manufacturer: [employer]   Lot: [id number]   [place]: [address]-1

## 2020-03-02 MED FILL — PANTOPRAZOLE SOD DR 40 MG T: 40 | 90 days supply | Qty: 180 | Fill #1

## 2020-03-02 MED FILL — LARISSIA 0.1-20 MG-MCG TABS: 0.1-20 | 84 days supply | Qty: 84 | Fill #0

## 2020-03-05 ENCOUNTER — Encounter: Payer: Self-pay | Admitting: Gastroenterology

## 2020-03-05 ENCOUNTER — Ambulatory Visit: Payer: 59 | Admitting: Gastroenterology

## 2020-03-05 VITALS — BP 118/64 | HR 86 | Ht <= 58 in | Wt 134.0 lb

## 2020-03-05 DIAGNOSIS — R140 Abdominal distension (gaseous): Secondary | ICD-10-CM | POA: Diagnosis not present

## 2020-03-05 DIAGNOSIS — Z1211 Encounter for screening for malignant neoplasm of colon: Secondary | ICD-10-CM

## 2020-03-05 DIAGNOSIS — K219 Gastro-esophageal reflux disease without esophagitis: Secondary | ICD-10-CM

## 2020-03-05 DIAGNOSIS — R1013 Epigastric pain: Secondary | ICD-10-CM | POA: Diagnosis not present

## 2020-03-05 NOTE — Progress Notes (Signed)
Chief Complaint: GERD, epigastric pain, bloating   Referring Provider:     Emeterio Reeve, DO   HPI:     Jillian Lee is a 44 y.o. female [employer] employee with a history of HTN, prediabetes, HLD, hypothyroidism, GERD referred to the Gastroenterology Clinic for evaluation of reflux symptoms along with epigastric pain and bloating.  Longstanding history of reflux, since her teenage years. Index sxs of HB, regurgitation.   Worse post prandial, spicy foods, overeating, supine, eating close to bedtime. Currently treated with Protonix 40 mg BID for the last few years. Previously treated with Nexium years ago (c/b abd pain, diarrhea).   Now also with abdominal bloating and epigastric discomfort the last 3 years or so.  Also worse with spicy foods, overeating.  No n/v. No dysphagia. No prior EGD. Has stopped soda, energy drinks, with some improvement in reflux symptoms.  No lower GI symptoms.  GERD-HRQL: 28/50 (on high-dose PPI therapy)  -01/2020: Negative Cologuard  Takes oral iron for previously reported history of IDA.  Hemoglobin has been normal since at least 2015 with normal MCV.  No iron panels for review.   Past Medical History:  Diagnosis Date  . GERD (gastroesophageal reflux disease)   . History of kidney stones   . HSV (herpes simplex virus) infection   . Hypercholesterolemia   . Hypertension   . Hypothyroidism   . IDA (iron deficiency anemia)   . Pre-diabetes   . Reactive airway disease that is not asthma    per pt pcp in epic 01-08-2019  per had chest discomfort with breathing,  symbicort inhaler given, (01-27-2019  per pt stop inhaler on daily basis condition improved     Past Surgical History:  Procedure Laterality Date  . CYSTOSCOPY/URETEROSCOPY/HOLMIUM LASER/STENT PLACEMENT Left 12/31/2018   Procedure: CYSTOSCOPY/URETEROSCOPY/HOLMIUM LASER/STENT PLACEMENT;LEFT RETROGRADE PYELOGRAM;STONE BASKET EXTRACTION;  Surgeon: Festus Aloe, MD;  Location:  [facility];  Service: Urology;  Laterality: Left;  . URETEROSCOPY WITH HOLMIUM LASER LITHOTRIPSY Left 01/28/2019   Procedure: URETEROSCOPY WITH HOLMIUM LASER LITHOTRIPSY/ STENT EXCHANGE;  Surgeon: Festus Aloe, MD;  Location: [facility];  Service: Urology;  Laterality: Left;   Family History  Problem Relation Age of Onset  . Anxiety disorder Mother   . Hypertension Mother   . Dementia Mother   . Alcohol abuse Mother   . Depression Mother   . Stroke Mother   . Alcohol abuse Maternal Uncle   . Diabetes Maternal Uncle   . Hypertension Maternal Uncle   . Cancer Maternal Grandmother   . Diabetes Maternal Grandmother    Social History   Tobacco Use  . Smoking status: Never Smoker  . Smokeless tobacco: Never Used  Vaping Use  . Vaping Use: Never used  Substance Use Topics  . Alcohol use: Yes    Comment: 6-8 ciders every other weekend  . Drug use: Never   Current Outpatient Medications  Medication Sig Dispense Refill  . Ascorbic Acid (VITAMIN C) 1000 MG tablet Take 1,000 mg by mouth daily with lunch. W/Rose Hips    . aspirin 81 MG chewable tablet Chew 1 tablet (81 mg total) by mouth daily. 90 tablet 3  . atorvastatin (LIPITOR) 40 MG tablet Take 1 tablet (40 mg total) by mouth daily. 90 tablet 3  . CVS MAXIMUM REDNESS RELIEF 0.03-0.5 % SOLN Place 1 drop into both eyes 3 (three) times daily.    . ferrous sulfate 325 (65  FE) MG tablet Take 1 tablet (325 mg total) by mouth daily. 90 tablet 3  . ipratropium (ATROVENT) 0.06 % nasal spray Place 2 sprays into both nostrils 4 (four) times daily. As needed for sinus congestion 15 mL 1  . levonorgestrel-ethinyl estradiol (LARISSIA) 0.1-20 MG-MCG tablet Take 1 tablet by mouth daily. 84 tablet 3  . levothyroxine (SYNTHROID) 50 MCG tablet Take 1 tablet (50 mcg total) by mouth daily before breakfast. 90 tablet 3  . lisinopril-hydrochlorothiazide (ZESTORETIC) 20-25 MG tablet Take 1 tablet by mouth daily. Due for follow up 90 tablet 3    . Multiple Vitamins-Minerals (MULTIVITAMIN WITH MINERALS) tablet Take 1 tablet by mouth daily with lunch.     . nitroGLYCERIN (NITROSTAT) 0.3 MG SL tablet Place 1 tablet (0.3 mg total) under the tongue every 5 (five) minutes as needed for chest pain. 15 tablet 12  . pantoprazole (PROTONIX) 40 MG tablet Take 1 tablet (40 mg total) by mouth daily. 90 tablet 3  . valACYclovir (VALTREX) 500 MG tablet Take 1 tablet (500 mg total) by mouth daily. 90 tablet 3   No current facility-administered medications for this visit.   Allergies  Allergen Reactions  . Clarithromycin Other (See Comments)    Doesn't feel well/ body aches      Review of Systems: All systems reviewed and negative except where noted in HPI.     Physical Exam:    Wt Readings from Last 3 Encounters:  03/05/20 134 lb (60.8 kg)  12/26/19 131 lb (59.4 kg)  03/07/19 124 lb (56.2 kg)    BP 118/64   Pulse 86   Ht 4' 9"  (1.448 m)   Wt 134 lb (60.8 kg)   BMI 29.00 kg/m  Constitutional:  Pleasant, in no acute distress. Psychiatric: Normal mood and affect. Behavior is normal. EENT: Pupils normal.  Conjunctivae are normal. No scleral icterus. Neck supple. No cervical LAD. Cardiovascular: Normal rate, regular rhythm. No edema Pulmonary/chest: Effort normal and breath sounds normal. No wheezing, rales or rhonchi. Abdominal: Soft, nondistended, nontender. Bowel sounds active throughout. There are no masses palpable. No hepatomegaly. Neurological: Alert and oriented to person place and time. Skin: Skin is warm and dry. No rashes noted.   ASSESSMENT AND PLAN;   1) GERD 2) Abdominal bloating 3) Epigastric pain  Discussed the pathophysiology of reflux at length today, to include treatment options.  Briefly discussed medical management versus antireflux surgical options, namely TIF, and will proceed as below:  -EGD to evaluate for erosive esophagitis, hiatal hernia, LES laxity -Resume high-dose Protonix for now -Continue  antireflux lifestyle/dietary modifications -Gastric/duodenal biopsies at time of EGD given additional upper GI complaints  4) Colon cancer screening -Cologuard negative in 01/2020.  Up-to-date.  Repeat screening in 2024  The indications, risks, and benefits of EGD were explained to the patient in detail. Risks include but are not limited to bleeding, perforation, adverse reaction to medications, and cardiopulmonary compromise. Sequelae include but are not limited to the possibility of surgery, hositalization, and mortality. The patient verbalized understanding and wished to proceed. All questions answered, referred to scheduler. Further recommendations pending results of the exam.    Lavena Bullion, DO, FACG  03/05/2020, 10:45 AM   Emeterio Reeve, DO

## 2020-03-05 NOTE — Patient Instructions (Addendum)
If you are age 46 or older, your body mass index should be between 23-30. Your Body mass index is 29 kg/m. If this is out of the aforementioned range listed, please consider follow up with your Primary Care Provider.  If you are age 36 or younger, your body mass index should be between 19-25. Your Body mass index is 29 kg/m. If this is out of the aformentioned range listed, please consider follow up with your Primary Care Provider.   You have been scheduled for an endoscopy. Please follow written instructions given to you at your visit today. If you use inhalers (even only as needed), please bring them with you on the day of your procedure.   It was a pleasure to see you today!  Vito Cirigliano, D.O.

## 2020-03-17 ENCOUNTER — Telehealth: Payer: Self-pay | Admitting: Gastroenterology

## 2020-03-17 ENCOUNTER — Encounter: Payer: 59 | Admitting: Gastroenterology

## 2020-03-17 NOTE — Telephone Encounter (Signed)
Thank you for the update!

## 2020-03-17 NOTE — Telephone Encounter (Signed)
Hi Dr Bryan Lemma,  Patient called this morning to cancel her procedure for this afternoon states she does not have the financial means for it at this time.

## 2020-03-22 ENCOUNTER — Telehealth: Payer: Self-pay

## 2020-03-22 MED FILL — LISINOPRIL-HCTZ 20-25 MG TA: 20-25 | 90 days supply | Qty: 90 | Fill #1

## 2020-03-22 MED FILL — LEVOTHYROXINE 50 MCG TABLET: 50 | 90 days supply | Qty: 90 | Fill #1

## 2020-03-22 NOTE — Telephone Encounter (Signed)
I do not see any information written about CoQ10 recommendations or reasons in the note from Dr. Redgie Grayer last visit. I will forward this to her to look over tomorrow. Typically, doses of 135m are acceptable, but without knowing her reasoning for taking this, I will defer to Dr. ASheppard Coilto determine what dose she should be on.

## 2020-03-22 NOTE — Telephone Encounter (Signed)
Patient called into office wanting blood work orders put in. Patient was advised orders were already in the system and to just get the blood work done. Patient also has concerns about the Coq10 regiment. Was told by Dr. Sheppard Coil that she needed the 40 mg. The patient says there is no such dsoage amount it begins at 100 mg. Should she get this dosage amount please advise.

## 2020-03-25 NOTE — Telephone Encounter (Signed)
CoQ10 can help with muscle aches associated with statins. 100 mg is fine. The 40 mg thing she's talking about is probably the atorvastatin

## 2020-03-26 NOTE — Telephone Encounter (Signed)
Patient notified and voiced her understanding.

## 2020-04-30 DIAGNOSIS — Z3041 Encounter for surveillance of contraceptive pills: Secondary | ICD-10-CM | POA: Diagnosis not present

## 2020-04-30 DIAGNOSIS — R7303 Prediabetes: Secondary | ICD-10-CM | POA: Diagnosis not present

## 2020-04-30 DIAGNOSIS — I10 Essential (primary) hypertension: Secondary | ICD-10-CM | POA: Diagnosis not present

## 2020-04-30 DIAGNOSIS — Z0000 Encounter for general adult medical examination without abnormal findings: Secondary | ICD-10-CM | POA: Diagnosis not present

## 2020-04-30 DIAGNOSIS — E034 Atrophy of thyroid (acquired): Secondary | ICD-10-CM | POA: Diagnosis not present

## 2020-04-30 DIAGNOSIS — K219 Gastro-esophageal reflux disease without esophagitis: Secondary | ICD-10-CM | POA: Diagnosis not present

## 2020-04-30 DIAGNOSIS — Z8639 Personal history of other endocrine, nutritional and metabolic disease: Secondary | ICD-10-CM | POA: Diagnosis not present

## 2020-04-30 DIAGNOSIS — E785 Hyperlipidemia, unspecified: Secondary | ICD-10-CM | POA: Diagnosis not present

## 2020-04-30 DIAGNOSIS — B009 Herpesviral infection, unspecified: Secondary | ICD-10-CM | POA: Diagnosis not present

## 2020-05-01 LAB — COMPLETE METABOLIC PANEL WITH GFR
AG Ratio: 1.8 (calc) (ref 1.0–2.5)
ALT: 28 U/L (ref 6–29)
AST: 25 U/L (ref 10–35)
Albumin: 4.1 g/dL (ref 3.6–5.1)
Alkaline phosphatase (APISO): 56 U/L (ref 31–125)
BUN: 15 mg/dL (ref 7–25)
CO2: 25 mmol/L (ref 20–32)
Calcium: 9.2 mg/dL (ref 8.6–10.2)
Chloride: 98 mmol/L (ref 98–110)
Creat: 0.88 mg/dL (ref 0.50–1.10)
GFR, Est African American: 91 mL/min/{1.73_m2} (ref >=60)
GFR, Est Non African American: 79 mL/min/{1.73_m2} (ref >=60)
Globulin: 2.3 g/dL (calc) (ref 1.9–3.7)
Glucose, Bld: 101 mg/dL — ABNORMAL HIGH (ref 65–99)
Potassium: 3.6 mmol/L (ref 3.5–5.3)
Sodium: 135 mmol/L (ref 135–146)
Total Bilirubin: 0.4 mg/dL (ref 0.2–1.2)
Total Protein: 6.4 g/dL (ref 6.1–8.1)

## 2020-05-01 LAB — LIPID PANEL
Cholesterol: 217 mg/dL — ABNORMAL HIGH (ref <200)
HDL: 49 mg/dL — ABNORMAL LOW (ref >=50)
LDL Cholesterol (Calc): 143 mg/dL (calc) — ABNORMAL HIGH
Non-HDL Cholesterol (Calc): 168 mg/dL (calc) — ABNORMAL HIGH (ref <130)
Total CHOL/HDL Ratio: 4.4 (calc) (ref <5.0)
Triglycerides: 126 mg/dL (ref <150)

## 2020-05-01 LAB — CBC
HCT: 43.4 % (ref 35.0–45.0)
Hemoglobin: 14.4 g/dL (ref 11.7–15.5)
MCH: 31.9 pg (ref 27.0–33.0)
MCHC: 33.2 g/dL (ref 32.0–36.0)
MCV: 96.2 fL (ref 80.0–100.0)
MPV: 8.8 fL (ref 7.5–12.5)
Platelets: 316 10*3/uL (ref 140–400)
RBC: 4.51 10*6/uL (ref 3.80–5.10)
RDW: 12 % (ref 11.0–15.0)
WBC: 8.9 10*3/uL (ref 3.8–10.8)

## 2020-05-01 LAB — HEMOGLOBIN A1C
Hgb A1c MFr Bld: 6.1 % of total Hgb — ABNORMAL HIGH (ref <5.7)
Mean Plasma Glucose: 128 (calc)
eAG (mmol/L): 7.1 (calc)

## 2020-05-01 LAB — TSH: TSH: 2.19 mIU/L

## 2020-05-03 MED FILL — ATORVASTATIN 40 MG TABLET: 40 | 90 days supply | Qty: 90 | Fill #3

## 2020-05-11 MED FILL — VALACYCLOVIR HCL 500 MG TAB: 500 | 90 days supply | Qty: 90 | Fill #1

## 2020-05-31 MED FILL — LARISSIA 0.1-20 MG-MCG TABS: 0.1-20 | 84 days supply | Qty: 84 | Fill #1

## 2020-06-07 ENCOUNTER — Other Ambulatory Visit: Payer: Self-pay | Admitting: Osteopathic Medicine

## 2020-06-07 MED FILL — PANTOPRAZOLE SOD DR 40 MG T: 40 | 90 days supply | Qty: 180 | Fill #2

## 2020-06-08 ENCOUNTER — Other Ambulatory Visit: Payer: Self-pay | Admitting: Osteopathic Medicine

## 2020-06-08 MED FILL — IPRATROPIUM 0.06% SPRAY: 0.06 | 10 days supply | Qty: 15 | Fill #0

## 2020-06-21 MED FILL — LEVOTHYROXINE 50 MCG TABLET: 50 | 90 days supply | Qty: 90 | Fill #2

## 2020-06-21 MED FILL — LISINOPRIL-HCTZ 20-25 MG TA: 20-25 | 90 days supply | Qty: 90 | Fill #2

## 2020-06-25 ENCOUNTER — Ambulatory Visit: Payer: 59 | Admitting: Osteopathic Medicine

## 2020-07-27 MED FILL — ATORVASTATIN 40 MG TABLET: 40 | 90 days supply | Qty: 90 | Fill #0

## 2020-08-18 MED FILL — IPRATROPIUM 0.06% SPRAY: 0.06 | 10 days supply | Qty: 15 | Fill #1

## 2020-08-26 MED FILL — VALACYCLOVIR HCL 500 MG TAB: 500 | 90 days supply | Qty: 90 | Fill #2

## 2020-08-31 ENCOUNTER — Telehealth: Payer: Self-pay | Admitting: Osteopathic Medicine

## 2020-08-31 NOTE — Telephone Encounter (Signed)
Received FMLA forms, no previous ones on file, no appt w/ me since 12/2019 for annual physical - please call patient: she needs to clarify what the FMLA forms are for - need visit w/ me to fill these out (virtual ok)

## 2020-09-06 NOTE — Telephone Encounter (Signed)
Task completed. Pt has been scheduled a virtual appt with provider on 09/08/20.

## 2020-09-08 ENCOUNTER — Telehealth: Payer: 59 | Admitting: Osteopathic Medicine

## 2020-09-08 ENCOUNTER — Other Ambulatory Visit: Payer: Self-pay | Admitting: Osteopathic Medicine

## 2020-09-08 ENCOUNTER — Encounter: Payer: Self-pay | Admitting: Osteopathic Medicine

## 2020-09-08 VITALS — Wt 130.0 lb

## 2020-09-08 DIAGNOSIS — N200 Calculus of kidney: Secondary | ICD-10-CM

## 2020-09-08 MED ORDER — HYDROCODONE-ACETAMINOPHEN 10-325 MG PO TABS: 0.5000 | ORAL_TABLET | Freq: Three times a day (TID) | ORAL | 0 refills | Status: DC | PRN

## 2020-09-08 MED FILL — HYDROCODON-APAP 10-325: 10-325 | 2 days supply | Qty: 5 | Fill #0

## 2020-09-08 NOTE — Progress Notes (Signed)
Telemedicine Visit via Audio only - telephone (patient preference /  technical difficulty with MyChart video application)  I connected with Jillian Lee on 09/08/20 at 1:48 PM  by phone or  telemedicine application as noted above  I verified that I am speaking with or regarding  the correct patient using two identifiers.  Participants: Myself, Dr Emeterio Reeve DO Patient: Jillian Lee Patient proxy if applicable: none Other, if applicable: none  Patient is at home I am in office at [facility]    I discussed the limitations of evaluation and management  by telemedicine and the availability of in person appointments.  The participant(s) above expressed understanding and  agreed to proceed with this appointment via telemedicine.       History of Present Illness: Jillian Lee is a 45 y.o. female who would like to discuss FMLA for renal stones causing episodeic flare ups    FMLA for kidney stones Has been passing renal stones and occasionally needing to leave work early. Needs FMLA for a year so she can take time off i  Has to be out of work 08/22/20 - 08/24/20 and passed stone Requesting pain meds to have on hand, uses Tylenol prn but this is only minimally helpful      Observations/Objective: Wt 130 lb (59 kg)   BMI 28.13 kg/m  BP Readings from Last 3 Encounters:  03/05/20 118/64  12/26/19 (!) 143/85  01/28/19 126/70   Exam: Normal Speech.  NAD  Lab and Radiology Results No results found for this or any previous visit (from the past 72 hour(s)). No results found.     Assessment and Plan: 45 y.o. female with The encounter diagnosis was Nephrolithiasis.  FMLA forms completes She has upcoming appt w/ urology   PDMP reviewed during this encounter. No orders of the defined types were placed in this encounter.  Meds ordered this encounter  Medications  . HYDROcodone-acetaminophen (NORCO) 10-325 MG tablet    Sig: Take 0.5-1  tablets by mouth every 8 (eight) hours as needed for up to 5 days.    Dispense:  5 tablet    Refill:  0   There are no Patient Instructions on file for this visit.  Instructions sent via MyChart.   Follow Up Instructions: Return for IN-OFFICE VISIT MONITOR A1C SOMETIME IN 10/2020.    I discussed the assessment and treatment plan with the patient. The patient was provided an opportunity to ask questions and all were answered. The patient agreed with the plan and demonstrated an understanding of the instructions.   The patient was advised to call back or seek an in-person evaluation if any new concerns, if symptoms worsen or if the condition fails to improve as anticipated.  21 minutes of non-face-to-face time was provided during this encounter.      . . . . . . . . . . . . . Marland Kitchen                   Historical information moved to improve visibility of documentation.  Past Medical History:  Diagnosis Date  . GERD (gastroesophageal reflux disease)   . History of kidney stones   . HSV (herpes simplex virus) infection   . Hypercholesterolemia   . Hypertension   . Hypothyroidism   . IDA (iron deficiency anemia)   . Pre-diabetes   . Reactive airway disease that is not asthma    per pt pcp in epic 01-08-2019  per had chest  discomfort with breathing,  symbicort inhaler given, (01-27-2019  per pt stop inhaler on daily basis condition improved   Past Surgical History:  Procedure Laterality Date  . CYSTOSCOPY/URETEROSCOPY/HOLMIUM LASER/STENT PLACEMENT Left 12/31/2018   Procedure: CYSTOSCOPY/URETEROSCOPY/HOLMIUM LASER/STENT PLACEMENT;LEFT RETROGRADE PYELOGRAM;STONE BASKET EXTRACTION;  Surgeon: Festus Aloe, MD;  Location: [facility];  Service: Urology;  Laterality: Left;  . URETEROSCOPY WITH HOLMIUM LASER LITHOTRIPSY Left 01/28/2019   Procedure: URETEROSCOPY WITH HOLMIUM LASER LITHOTRIPSY/ STENT EXCHANGE;  Surgeon: Festus Aloe, MD;  Location: [facility];  Service: Urology;  Laterality: Left;   Social History   Tobacco Use  . Smoking status: Never Smoker  . Smokeless tobacco: Never Used  Substance Use Topics  . Alcohol use: Yes    Comment: 6-8 ciders every other weekend   family history includes Alcohol abuse in her maternal uncle and mother; Anxiety disorder in her mother; Cancer in her maternal grandmother; Dementia in her mother; Depression in her mother; Diabetes in her maternal grandmother and maternal uncle; Hypertension in her maternal uncle and mother; Stroke in her mother.  Medications: Current Outpatient Medications  Medication Sig Dispense Refill  . Ascorbic Acid (VITAMIN C) 1000 MG tablet Take 1,000 mg by mouth daily with lunch. W/Rose Hips    . aspirin 81 MG chewable tablet Chew 1 tablet (81 mg total) by mouth daily. 90 tablet 3  . atorvastatin (LIPITOR) 40 MG tablet Take 1 tablet (40 mg total) by mouth daily. 90 tablet 3  . [facility] MAXIMUM REDNESS RELIEF 0.03-0.5 % SOLN Place 1 drop into both eyes 3 (three) times daily.    . ferrous sulfate 325 (65 FE) MG tablet Take 1 tablet (325 mg total) by mouth daily. 90 tablet 3  . HYDROcodone-acetaminophen (NORCO) 10-325 MG tablet Take 0.5-1 tablets by mouth every 8 (eight) hours as needed for up to 5 days. 5 tablet 0  . ipratropium (ATROVENT) 0.06 % nasal spray PLACE 2 SPRAYS INTO BOTH NOSTRILS 4 (FOUR) TIMES DAILY. AS NEEDED FOR SINUS CONGESTION 15 mL 1  . levonorgestrel-ethinyl estradiol (LARISSIA) 0.1-20 MG-MCG tablet Take 1 tablet by mouth daily. 84 tablet 3  . levothyroxine (SYNTHROID) 50 MCG tablet Take 1 tablet (50 mcg total) by mouth daily before breakfast. 90 tablet 3  . lisinopril-hydrochlorothiazide (ZESTORETIC) 20-25 MG tablet Take 1 tablet by mouth daily. Due for follow up 90 tablet 3  . Multiple Vitamins-Minerals (MULTIVITAMIN WITH MINERALS) tablet Take 1 tablet by mouth daily with lunch.     . nitroGLYCERIN (NITROSTAT) 0.3 MG SL tablet Place 1 tablet (0.3 mg  total) under the tongue every 5 (five) minutes as needed for chest pain. 15 tablet 12  . pantoprazole (PROTONIX) 40 MG tablet Take 1 tablet (40 mg total) by mouth daily. 90 tablet 3  . valACYclovir (VALTREX) 500 MG tablet Take 1 tablet (500 mg total) by mouth daily. 90 tablet 3   No current facility-administered medications for this visit.   Allergies  Allergen Reactions  . Clarithromycin Other (See Comments)    Doesn't feel well/ body aches      If phone visit, billing and coding can please add appropriate modifier if needed

## 2020-09-09 MED FILL — LARISSIA 0.1-20 MG-MCG TABS: 0.1-20 | 84 days supply | Qty: 84 | Fill #2

## 2020-09-09 MED FILL — PANTOPRAZOLE SOD DR 40 MG T: 40 | 90 days supply | Qty: 180 | Fill #3

## 2020-09-14 MED FILL — LISINOPRIL-HCTZ 20-25 MG TA: 20-25 | 90 days supply | Qty: 90 | Fill #3

## 2020-09-15 MED FILL — LEVOTHYROXINE 50 MCG TABLET: 50 | 90 days supply | Qty: 90 | Fill #3

## 2020-09-22 DIAGNOSIS — N200 Calculus of kidney: Secondary | ICD-10-CM | POA: Diagnosis not present

## 2020-10-09 ENCOUNTER — Other Ambulatory Visit (HOSPITAL_COMMUNITY): Payer: Self-pay

## 2020-10-24 MED FILL — Atorvastatin Calcium Tab 40 MG (Base Equivalent): ORAL | 90 days supply | Qty: 90 | Fill #0 | Status: AC

## 2020-10-25 ENCOUNTER — Other Ambulatory Visit (HOSPITAL_COMMUNITY): Payer: Self-pay

## 2020-11-01 ENCOUNTER — Other Ambulatory Visit: Payer: Self-pay | Admitting: Family Medicine

## 2020-11-01 ENCOUNTER — Other Ambulatory Visit: Payer: Self-pay

## 2020-11-01 ENCOUNTER — Other Ambulatory Visit (HOSPITAL_COMMUNITY): Payer: Self-pay

## 2020-11-01 ENCOUNTER — Ambulatory Visit: Payer: Self-pay

## 2020-11-01 DIAGNOSIS — M898X5 Other specified disorders of bone, thigh: Secondary | ICD-10-CM

## 2020-12-07 ENCOUNTER — Other Ambulatory Visit: Payer: Self-pay | Admitting: Osteopathic Medicine

## 2020-12-07 DIAGNOSIS — I10 Essential (primary) hypertension: Secondary | ICD-10-CM

## 2020-12-07 MED FILL — Levonorgestrel & Ethinyl Estradiol Tab 0.1 MG-20 MCG: ORAL | 84 days supply | Qty: 84 | Fill #0 | Status: AC

## 2020-12-07 MED FILL — Valacyclovir HCl Tab 500 MG: ORAL | 90 days supply | Qty: 90 | Fill #0 | Status: AC

## 2020-12-08 ENCOUNTER — Other Ambulatory Visit (HOSPITAL_COMMUNITY): Payer: Self-pay

## 2020-12-08 MED ORDER — LISINOPRIL-HYDROCHLOROTHIAZIDE 20-25 MG PO TABS
1.0000 | ORAL_TABLET | Freq: Every day | ORAL | 3 refills | Status: DC
  Filled 2020-12-08: qty 90, 90d supply, fill #0
  Filled 2021-03-09: qty 90, 90d supply, fill #1
  Filled 2021-06-06: qty 90, 90d supply, fill #2
  Filled 2021-09-07: qty 90, 90d supply, fill #3

## 2020-12-12 ENCOUNTER — Other Ambulatory Visit: Payer: Self-pay | Admitting: Osteopathic Medicine

## 2020-12-13 ENCOUNTER — Other Ambulatory Visit: Payer: Self-pay | Admitting: Osteopathic Medicine

## 2020-12-13 ENCOUNTER — Other Ambulatory Visit (HOSPITAL_COMMUNITY): Payer: Self-pay

## 2020-12-13 MED ORDER — PANTOPRAZOLE SODIUM 40 MG PO TBEC
40.0000 mg | DELAYED_RELEASE_TABLET | Freq: Two times a day (BID) | ORAL | 1 refills | Status: DC
  Filled 2020-12-13: qty 180, 90d supply, fill #0
  Filled 2021-03-13: qty 180, 90d supply, fill #1

## 2020-12-13 MED ORDER — IPRATROPIUM BROMIDE 0.06 % NA SOLN
2.0000 | Freq: Four times a day (QID) | NASAL | 1 refills | Status: DC | PRN
  Filled 2020-12-13: qty 15, 10d supply, fill #0
  Filled 2021-07-09: qty 15, 10d supply, fill #1

## 2020-12-16 ENCOUNTER — Other Ambulatory Visit (HOSPITAL_COMMUNITY): Payer: Self-pay

## 2020-12-16 ENCOUNTER — Other Ambulatory Visit: Payer: Self-pay | Admitting: Osteopathic Medicine

## 2020-12-16 DIAGNOSIS — E034 Atrophy of thyroid (acquired): Secondary | ICD-10-CM

## 2020-12-16 MED ORDER — LEVOTHYROXINE SODIUM 50 MCG PO TABS
50.0000 ug | ORAL_TABLET | Freq: Every day | ORAL | 0 refills | Status: DC
  Filled 2020-12-16: qty 90, 90d supply, fill #0

## 2021-01-23 ENCOUNTER — Other Ambulatory Visit: Payer: Self-pay | Admitting: Osteopathic Medicine

## 2021-01-24 ENCOUNTER — Other Ambulatory Visit (HOSPITAL_COMMUNITY): Payer: Self-pay

## 2021-01-24 MED ORDER — ATORVASTATIN CALCIUM 40 MG PO TABS
40.0000 mg | ORAL_TABLET | Freq: Every day | ORAL | 3 refills | Status: DC
  Filled 2021-01-24: qty 90, 90d supply, fill #0
  Filled 2021-04-25: qty 90, 90d supply, fill #1
  Filled 2021-07-28: qty 90, 90d supply, fill #2
  Filled 2021-10-30: qty 90, 90d supply, fill #3

## 2021-03-09 ENCOUNTER — Other Ambulatory Visit (HOSPITAL_COMMUNITY): Payer: Self-pay

## 2021-03-10 ENCOUNTER — Other Ambulatory Visit (HOSPITAL_COMMUNITY): Payer: Self-pay

## 2021-03-13 ENCOUNTER — Other Ambulatory Visit: Payer: Self-pay | Admitting: Osteopathic Medicine

## 2021-03-13 DIAGNOSIS — E034 Atrophy of thyroid (acquired): Secondary | ICD-10-CM

## 2021-03-15 ENCOUNTER — Other Ambulatory Visit: Payer: Self-pay | Admitting: Osteopathic Medicine

## 2021-03-15 ENCOUNTER — Other Ambulatory Visit (HOSPITAL_COMMUNITY): Payer: Self-pay

## 2021-03-15 DIAGNOSIS — E034 Atrophy of thyroid (acquired): Secondary | ICD-10-CM

## 2021-03-15 MED ORDER — LEVOTHYROXINE SODIUM 50 MCG PO TABS
50.0000 ug | ORAL_TABLET | Freq: Every day | ORAL | 0 refills | Status: DC
  Filled 2021-03-15: qty 30, 30d supply, fill #0

## 2021-03-29 ENCOUNTER — Other Ambulatory Visit: Payer: Self-pay | Admitting: Osteopathic Medicine

## 2021-03-29 DIAGNOSIS — Z3041 Encounter for surveillance of contraceptive pills: Secondary | ICD-10-CM

## 2021-03-30 ENCOUNTER — Other Ambulatory Visit (HOSPITAL_COMMUNITY): Payer: Self-pay

## 2021-03-30 MED ORDER — LEVONORGESTREL-ETHINYL ESTRAD 0.1-20 MG-MCG PO TABS
1.0000 | ORAL_TABLET | Freq: Every day | ORAL | 0 refills | Status: DC
  Filled 2021-03-30: qty 28, 28d supply, fill #0

## 2021-04-10 ENCOUNTER — Other Ambulatory Visit: Payer: Self-pay | Admitting: Osteopathic Medicine

## 2021-04-10 DIAGNOSIS — E034 Atrophy of thyroid (acquired): Secondary | ICD-10-CM

## 2021-04-10 DIAGNOSIS — B009 Herpesviral infection, unspecified: Secondary | ICD-10-CM

## 2021-04-11 ENCOUNTER — Other Ambulatory Visit (HOSPITAL_COMMUNITY): Payer: Self-pay

## 2021-04-11 ENCOUNTER — Other Ambulatory Visit: Payer: Self-pay

## 2021-04-11 DIAGNOSIS — B009 Herpesviral infection, unspecified: Secondary | ICD-10-CM

## 2021-04-11 MED ORDER — LEVOTHYROXINE SODIUM 50 MCG PO TABS
50.0000 ug | ORAL_TABLET | Freq: Every day | ORAL | 0 refills | Status: DC
  Filled 2021-04-11: qty 30, 30d supply, fill #0

## 2021-04-11 MED ORDER — VALACYCLOVIR HCL 500 MG PO TABS
500.0000 mg | ORAL_TABLET | Freq: Every day | ORAL | 0 refills | Status: DC
  Filled 2021-04-11: qty 30, 30d supply, fill #0

## 2021-04-15 ENCOUNTER — Other Ambulatory Visit (HOSPITAL_COMMUNITY): Payer: Self-pay

## 2021-04-25 ENCOUNTER — Other Ambulatory Visit (HOSPITAL_COMMUNITY): Payer: Self-pay

## 2021-04-25 ENCOUNTER — Other Ambulatory Visit: Payer: Self-pay | Admitting: Medical-Surgical

## 2021-04-25 ENCOUNTER — Other Ambulatory Visit: Payer: Self-pay | Admitting: Osteopathic Medicine

## 2021-04-25 DIAGNOSIS — Z3041 Encounter for surveillance of contraceptive pills: Secondary | ICD-10-CM

## 2021-04-25 MED FILL — Levonorgestrel & Ethinyl Estradiol Tab 0.1 MG-20 MCG: ORAL | 28 days supply | Qty: 28 | Fill #0 | Status: AC

## 2021-04-26 ENCOUNTER — Other Ambulatory Visit (HOSPITAL_COMMUNITY): Payer: Self-pay

## 2021-04-26 ENCOUNTER — Other Ambulatory Visit: Payer: Self-pay | Admitting: Medical-Surgical

## 2021-04-26 DIAGNOSIS — Z3041 Encounter for surveillance of contraceptive pills: Secondary | ICD-10-CM

## 2021-04-26 MED FILL — Levonorgestrel & Ethinyl Estradiol Tab 0.1 MG-20 MCG: ORAL | 28 days supply | Qty: 28 | Fill #0 | Status: CN

## 2021-04-28 ENCOUNTER — Other Ambulatory Visit (HOSPITAL_COMMUNITY): Payer: Self-pay

## 2021-05-04 ENCOUNTER — Ambulatory Visit: Payer: 59 | Admitting: Medical-Surgical

## 2021-05-04 ENCOUNTER — Other Ambulatory Visit (HOSPITAL_COMMUNITY): Payer: Self-pay

## 2021-05-04 ENCOUNTER — Encounter: Payer: Self-pay | Admitting: Medical-Surgical

## 2021-05-04 VITALS — BP 136/82 | HR 74 | Resp 20 | Ht <= 58 in | Wt 137.0 lb

## 2021-05-04 DIAGNOSIS — E785 Hyperlipidemia, unspecified: Secondary | ICD-10-CM | POA: Diagnosis not present

## 2021-05-04 DIAGNOSIS — K219 Gastro-esophageal reflux disease without esophagitis: Secondary | ICD-10-CM

## 2021-05-04 DIAGNOSIS — I10 Essential (primary) hypertension: Secondary | ICD-10-CM

## 2021-05-04 DIAGNOSIS — Z3041 Encounter for surveillance of contraceptive pills: Secondary | ICD-10-CM | POA: Diagnosis not present

## 2021-05-04 DIAGNOSIS — B009 Herpesviral infection, unspecified: Secondary | ICD-10-CM

## 2021-05-04 DIAGNOSIS — Z7689 Persons encountering health services in other specified circumstances: Secondary | ICD-10-CM | POA: Diagnosis not present

## 2021-05-04 DIAGNOSIS — E034 Atrophy of thyroid (acquired): Secondary | ICD-10-CM | POA: Diagnosis not present

## 2021-05-04 DIAGNOSIS — R7303 Prediabetes: Secondary | ICD-10-CM

## 2021-05-04 MED ORDER — VALACYCLOVIR HCL 500 MG PO TABS
500.0000 mg | ORAL_TABLET | Freq: Every day | ORAL | 0 refills | Status: DC
  Filled 2021-05-04: qty 30, 30d supply, fill #0

## 2021-05-04 MED ORDER — LEVONORGESTREL-ETHINYL ESTRAD 0.1-20 MG-MCG PO TABS
1.0000 | ORAL_TABLET | Freq: Every day | ORAL | 3 refills | Status: DC
  Filled 2021-05-04 – 2021-05-31 (×2): qty 84, 84d supply, fill #0
  Filled 2021-09-07: qty 84, 84d supply, fill #1
  Filled 2021-11-08 – 2021-12-07 (×2): qty 84, 84d supply, fill #2
  Filled 2022-03-15: qty 84, 84d supply, fill #3

## 2021-05-04 MED ORDER — VALACYCLOVIR HCL 500 MG PO TABS
500.0000 mg | ORAL_TABLET | Freq: Every day | ORAL | 1 refills | Status: DC
  Filled 2021-05-04: qty 90, 90d supply, fill #0
  Filled 2021-08-28: qty 90, 90d supply, fill #1

## 2021-05-04 MED ORDER — PANTOPRAZOLE SODIUM 40 MG PO TBEC
40.0000 mg | DELAYED_RELEASE_TABLET | Freq: Two times a day (BID) | ORAL | 1 refills | Status: DC
  Filled 2021-05-04 – 2021-06-16 (×2): qty 180, 90d supply, fill #0
  Filled 2021-09-16: qty 180, 90d supply, fill #1

## 2021-05-04 NOTE — Progress Notes (Signed)
  HPI with pertinent ROS:   CC: transfer of care, chronic disease follow up  HPI: Pleasant 45 year old female presenting to transfer care to a new PCP and for the following:  HTN: Stable on lisinopril-hydrochlorothiazide 20-25 mg daily.  Tolerating medication well without side effects.  Checking blood pressures at home with similar readings to today.  HLD: Taking Lipitor 40 mg daily, tolerating well without side effects.  Prediabetes: Most recent hemoglobin A1c 6.1%.  GERD: Has been on long-term pantoprazole 40 mg twice daily, about 2 years.  Has not tried reducing her dose or frequency so far.  HSV infection: Taking Valtrex 500 mg daily, tolerating well without side effects.  No recent outbreaks.  Birth control: Has been on [place] for several years, tolerating well without side effects.  Has monthly menses without significant symptoms or heavy flow.  Non-smoker with no history of migraines.  Denies vision changes, severe headaches, severe abdominal pain, and calf pain/swelling.  I reviewed the past medical history, family history, social history, surgical history, and allergies today and no changes were needed.  Please see the problem list section below in epic for further details.   Physical exam:   General: Well Developed, well nourished, and in no acute distress.  Neuro: Alert and oriented x3.  HEENT: Normocephalic, atraumatic.  Skin: Warm and dry. Cardiac: Regular rate and rhythm, no murmurs rubs or gallops, no lower extremity edema.  Respiratory: Clear to auscultation bilaterally. Not using accessory muscles, speaking in full sentences.  Impression and Recommendations:    1. Encounter to establish care Reviewed available information and discussed care concerns with patient.   2. Essential hypertension, benign Checking labs today.  Continue lisinopril-HCTZ as prescribed. - Lipid panel - COMPLETE METABOLIC PANEL WITH GFR - CBC with Differential/Platelet  3.  Hyperlipidemia, unspecified hyperlipidemia type Checking lipid panel today.  Continue atorvastatin as prescribed. - Lipid panel  4. Prediabetes Checking hemoglobin A1c today. - Hemoglobin A1c  5. Hypothyroidism due to acquired atrophy of thyroid Checking TSH today.  Continue levothyroxine as prescribed for now and may need to adjust depending on results - TSH  6. Gastroesophageal reflux disease, unspecified whether esophagitis present Continue Protonix 40 mg twice daily although we may need to try reducing the dose to determine if she can tolerate lower doses with good symptom control.  Reviewed risk of osteoporosis with high-dose long-term treatment with this medication. - pantoprazole (PROTONIX) 40 MG tablet; Take 1 tablet (40 mg total) by mouth 2 (two) times daily before a meal.  Dispense: 180 tablet; Refill: 1  7. HSV infection Continue Valtrex 500 mg daily, refills sent - valACYclovir (VALTREX) 500 MG tablet; Take 1 tablet (500 mg total) by mouth daily.  Dispense: 30 tablet; Refill: 0  8. Encounter for surveillance of contraceptive pills Continue Larissia as prescribed.  Refill sent for 1 year. - levonorgestrel-ethinyl estradiol (LARISSIA) 0.1-20 MG-MCG tablet; Take 1 tablet by mouth daily.  Dispense: 84 tablet; Refill: 3  Return in about 6 months (around 11/02/2021) for chronic disease follow up. ___________________________________________ Jillian Sorrel, DNP, APRN, FNP-BC Primary Care and [place]

## 2021-05-05 ENCOUNTER — Other Ambulatory Visit (HOSPITAL_COMMUNITY): Payer: Self-pay

## 2021-05-05 LAB — COMPLETE METABOLIC PANEL WITH GFR
AG Ratio: 2 (calc) (ref 1.0–2.5)
ALT: 47 U/L — ABNORMAL HIGH (ref 6–29)
AST: 27 U/L (ref 10–35)
Albumin: 4.5 g/dL (ref 3.6–5.1)
Alkaline phosphatase (APISO): 71 U/L (ref 31–125)
BUN: 20 mg/dL (ref 7–25)
CO2: 25 mmol/L (ref 20–32)
Calcium: 9.3 mg/dL (ref 8.6–10.2)
Chloride: 100 mmol/L (ref 98–110)
Creat: 0.95 mg/dL (ref 0.50–0.99)
Globulin: 2.3 g/dL (calc) (ref 1.9–3.7)
Glucose, Bld: 100 mg/dL — ABNORMAL HIGH (ref 65–99)
Potassium: 4.1 mmol/L (ref 3.5–5.3)
Sodium: 139 mmol/L (ref 135–146)
Total Bilirubin: 0.3 mg/dL (ref 0.2–1.2)
Total Protein: 6.8 g/dL (ref 6.1–8.1)
eGFR: 74 mL/min/{1.73_m2} (ref >=60)

## 2021-05-05 LAB — CBC WITH DIFFERENTIAL/PLATELET
Absolute Monocytes: 624 cells/uL (ref 200–950)
Basophils Absolute: 39 cells/uL (ref 0–200)
Basophils Relative: 0.5 %
Eosinophils Absolute: 94 cells/uL (ref 15–500)
Eosinophils Relative: 1.2 %
HCT: 44.8 % (ref 35.0–45.0)
Hemoglobin: 15 g/dL (ref 11.7–15.5)
Lymphs Abs: 2324 cells/uL (ref 850–3900)
MCH: 31.4 pg (ref 27.0–33.0)
MCHC: 33.5 g/dL (ref 32.0–36.0)
MCV: 93.7 fL (ref 80.0–100.0)
MPV: 8.7 fL (ref 7.5–12.5)
Monocytes Relative: 8 %
Neutro Abs: 4719 cells/uL (ref 1500–7800)
Neutrophils Relative %: 60.5 %
Platelets: 273 10*3/uL (ref 140–400)
RBC: 4.78 10*6/uL (ref 3.80–5.10)
RDW: 11.9 % (ref 11.0–15.0)
Total Lymphocyte: 29.8 %
WBC: 7.8 10*3/uL (ref 3.8–10.8)

## 2021-05-05 LAB — LIPID PANEL
Cholesterol: 229 mg/dL — ABNORMAL HIGH (ref <200)
HDL: 57 mg/dL (ref >=50)
LDL Cholesterol (Calc): 141 mg/dL (calc) — ABNORMAL HIGH
Non-HDL Cholesterol (Calc): 172 mg/dL (calc) — ABNORMAL HIGH (ref <130)
Total CHOL/HDL Ratio: 4 (calc) (ref <5.0)
Triglycerides: 179 mg/dL — ABNORMAL HIGH (ref <150)

## 2021-05-05 LAB — TSH: TSH: 2.1 mIU/L

## 2021-05-05 LAB — HEMOGLOBIN A1C
Hgb A1c MFr Bld: 6.3 % of total Hgb — ABNORMAL HIGH (ref <5.7)
Mean Plasma Glucose: 134 mg/dL
eAG (mmol/L): 7.4 mmol/L

## 2021-05-10 ENCOUNTER — Other Ambulatory Visit: Payer: Self-pay | Admitting: Physician Assistant

## 2021-05-10 ENCOUNTER — Other Ambulatory Visit (HOSPITAL_COMMUNITY): Payer: Self-pay

## 2021-05-10 DIAGNOSIS — E034 Atrophy of thyroid (acquired): Secondary | ICD-10-CM

## 2021-05-11 ENCOUNTER — Other Ambulatory Visit (HOSPITAL_COMMUNITY): Payer: Self-pay

## 2021-05-11 MED ORDER — LEVOTHYROXINE SODIUM 50 MCG PO TABS
50.0000 ug | ORAL_TABLET | Freq: Every day | ORAL | 0 refills | Status: DC
Start: 2021-05-11 — End: 2021-08-15
  Filled 2021-05-11: qty 90, 90d supply, fill #0

## 2021-05-31 ENCOUNTER — Other Ambulatory Visit (HOSPITAL_COMMUNITY): Payer: Self-pay

## 2021-06-07 ENCOUNTER — Other Ambulatory Visit (HOSPITAL_COMMUNITY): Payer: Self-pay

## 2021-06-16 ENCOUNTER — Other Ambulatory Visit (HOSPITAL_COMMUNITY): Payer: Self-pay

## 2021-07-11 ENCOUNTER — Other Ambulatory Visit (HOSPITAL_COMMUNITY): Payer: Self-pay

## 2021-07-15 ENCOUNTER — Other Ambulatory Visit (HOSPITAL_COMMUNITY): Payer: Self-pay

## 2021-07-18 ENCOUNTER — Other Ambulatory Visit: Payer: Self-pay | Admitting: Medical-Surgical

## 2021-07-19 ENCOUNTER — Telehealth: Payer: 59 | Admitting: Family Medicine

## 2021-07-19 ENCOUNTER — Other Ambulatory Visit (HOSPITAL_COMMUNITY): Payer: Self-pay

## 2021-07-19 ENCOUNTER — Encounter: Payer: Self-pay | Admitting: Family Medicine

## 2021-07-19 VITALS — Ht <= 58 in | Wt 145.0 lb

## 2021-07-19 DIAGNOSIS — J0140 Acute pansinusitis, unspecified: Secondary | ICD-10-CM | POA: Diagnosis not present

## 2021-07-19 MED ORDER — HYDROCOD POLST-CPM POLST ER 10-8 MG/5ML PO SUER
5.0000 mL | Freq: Two times a day (BID) | ORAL | 0 refills | Status: AC | PRN
  Filled 2021-07-19: qty 50, 5d supply, fill #0

## 2021-07-19 MED ORDER — AMOXICILLIN-POT CLAVULANATE 875-125 MG PO TABS
1.0000 | ORAL_TABLET | Freq: Two times a day (BID) | ORAL | 0 refills | Status: AC
  Filled 2021-07-19: qty 20, 10d supply, fill #0

## 2021-07-19 MED ORDER — BENZONATATE 200 MG PO CAPS
200.0000 mg | ORAL_CAPSULE | Freq: Two times a day (BID) | ORAL | 0 refills | Status: DC | PRN
Start: 2021-07-19 — End: 2022-01-25
  Filled 2021-07-19: qty 20, 10d supply, fill #0

## 2021-07-19 NOTE — Progress Notes (Signed)
Virtual Visit via Telephone Note  I connected with  Jillian Lee on 07/19/21 at  1:20 PM EST by telephone and verified that I am speaking with the correct person using two identifiers.   I discussed the limitations, risks, security and privacy concerns of performing an evaluation and management service by telephone and the availability of in person appointments. I also discussed with the patient that there may be a patient responsible charge related to this service. The patient expressed understanding and agreed to proceed.  Participating parties included in this telephone visit include: The patient and the nurse practitioner listed.  The patient is: At home I am: In the office  Subjective:    CC: sinusitis  HPI: Jillian Lee is a 46 y.o. year old female presenting today via telephone visit to discuss sinusitis symptoms.  Patient states about 2-3 weeks ago she started with URI symptoms that she thought were gradually starting to improve. However, symptoms seemed to worsen again last week. She is reports headache, nasal congestion, sinus pressure, sore throat, productive cough with green sputum. She denies any chest pain, dyspnea, wheezing, nausea, vomiting, diarrhea, fever, loss of taste/smell. She has had 2 negative COVID tests - most recent one was 2 days ago. She has tried taking ipratropium nasal spray, cough drops, Mucinex cold & flu.     Past medical history, Surgical history, Family history not pertinant except as noted below, Social history, Allergies, and medications have been entered into the medical record, reviewed, and corrections made.   Review of Systems:  All review of systems negative except what is listed in the HPI  Objective:    General:  Patient speaking clearly in complete sentences. No shortness of breath noted.   Alert and oriented x3.   Normal judgment.  No apparent acute distress.  Impression and Recommendations:    1. Acute non-recurrent  pansinusitis - amoxicillin-clavulanate (AUGMENTIN) 875-125 MG tablet; Take 1 tablet by mouth 2 (two) times daily for 10 days.  Dispense: 20 tablet; Refill: 0 - benzonatate (TESSALON) 200 MG capsule; Take 1 capsule (200 mg total) by mouth 2 (two) times daily as needed for cough.  Dispense: 20 capsule; Refill: 0 - chlorpheniramine-HYDROcodone (TUSSIONEX) 10-8 MG/5ML SUER; Take 5 mLs by mouth every 12 (twelve) hours as needed for up to 5 days for cough (cough, will cause drowsiness.).  Dispense: 50 mL; Refill: 0  Duration and double sickening consistent with sinusitis. Starting Augmentin and adding Tessalon and Tussionex for cough. Continue supportive measures including rest, hydration, humidifier use, steam showers, warm compresses to sinuses, warm liquids with lemon and honey, and over-the-counter cough, cold, and analgesics as needed.  Patient aware of signs/symptoms requiring further/urgent evaluation.   Follow-up if symptoms worsen or fail to improve.     I discussed the assessment and treatment plan with the patient. The patient was provided an opportunity to ask questions and all were answered. The patient agreed with the plan and demonstrated an understanding of the instructions.   The patient was advised to call back or seek an in-person evaluation if the symptoms worsen or if the condition fails to improve as anticipated.  I provided 20 minutes of non-face-to-face time during this TELEPHONE encounter.    Terrilyn Saver, NP

## 2021-07-20 ENCOUNTER — Other Ambulatory Visit (HOSPITAL_COMMUNITY): Payer: Self-pay

## 2021-07-21 ENCOUNTER — Other Ambulatory Visit (HOSPITAL_COMMUNITY): Payer: Self-pay

## 2021-07-28 ENCOUNTER — Telehealth: Payer: Self-pay

## 2021-07-28 ENCOUNTER — Other Ambulatory Visit (HOSPITAL_COMMUNITY): Payer: Self-pay

## 2021-07-28 NOTE — Telephone Encounter (Signed)
Pt's FMLA forms faxed x3, with successful confirmation. Pt has been notified.

## 2021-08-15 ENCOUNTER — Other Ambulatory Visit: Payer: Self-pay | Admitting: Medical-Surgical

## 2021-08-15 ENCOUNTER — Other Ambulatory Visit (HOSPITAL_COMMUNITY): Payer: Self-pay

## 2021-08-15 DIAGNOSIS — E034 Atrophy of thyroid (acquired): Secondary | ICD-10-CM

## 2021-08-15 MED ORDER — LEVOTHYROXINE SODIUM 50 MCG PO TABS
50.0000 ug | ORAL_TABLET | Freq: Every day | ORAL | 0 refills | Status: DC
  Filled 2021-08-15: qty 90, 90d supply, fill #0

## 2021-08-29 ENCOUNTER — Other Ambulatory Visit (HOSPITAL_COMMUNITY): Payer: Self-pay

## 2021-09-07 ENCOUNTER — Other Ambulatory Visit (HOSPITAL_COMMUNITY): Payer: Self-pay

## 2021-09-16 ENCOUNTER — Other Ambulatory Visit (HOSPITAL_COMMUNITY): Payer: Self-pay

## 2021-10-31 ENCOUNTER — Other Ambulatory Visit (HOSPITAL_COMMUNITY): Payer: Self-pay

## 2021-11-08 ENCOUNTER — Other Ambulatory Visit (HOSPITAL_COMMUNITY): Payer: Self-pay

## 2021-11-10 ENCOUNTER — Other Ambulatory Visit (HOSPITAL_COMMUNITY): Payer: Self-pay

## 2021-11-10 ENCOUNTER — Other Ambulatory Visit: Payer: Self-pay | Admitting: Medical-Surgical

## 2021-11-10 DIAGNOSIS — E034 Atrophy of thyroid (acquired): Secondary | ICD-10-CM

## 2021-11-10 MED ORDER — LEVOTHYROXINE SODIUM 50 MCG PO TABS
50.0000 ug | ORAL_TABLET | Freq: Every day | ORAL | 1 refills | Status: DC
  Filled 2021-11-10: qty 90, 90d supply, fill #0
  Filled 2022-02-07: qty 90, 90d supply, fill #1

## 2021-11-14 ENCOUNTER — Other Ambulatory Visit (HOSPITAL_COMMUNITY): Payer: Self-pay

## 2021-11-22 ENCOUNTER — Other Ambulatory Visit (HOSPITAL_COMMUNITY): Payer: Self-pay

## 2021-12-07 ENCOUNTER — Other Ambulatory Visit: Payer: Self-pay | Admitting: Medical-Surgical

## 2021-12-07 ENCOUNTER — Other Ambulatory Visit: Payer: Self-pay | Admitting: Osteopathic Medicine

## 2021-12-07 DIAGNOSIS — I10 Essential (primary) hypertension: Secondary | ICD-10-CM

## 2021-12-07 DIAGNOSIS — B009 Herpesviral infection, unspecified: Secondary | ICD-10-CM

## 2021-12-08 ENCOUNTER — Other Ambulatory Visit (HOSPITAL_COMMUNITY): Payer: Self-pay

## 2021-12-08 MED ORDER — LISINOPRIL-HYDROCHLOROTHIAZIDE 20-25 MG PO TABS
1.0000 | ORAL_TABLET | Freq: Every day | ORAL | 0 refills | Status: DC
  Filled 2021-12-08: qty 30, 30d supply, fill #0

## 2021-12-13 ENCOUNTER — Other Ambulatory Visit (HOSPITAL_COMMUNITY): Payer: Self-pay

## 2021-12-13 MED ORDER — VALACYCLOVIR HCL 500 MG PO TABS
500.0000 mg | ORAL_TABLET | Freq: Every day | ORAL | 0 refills | Status: DC
  Filled 2021-12-13: qty 90, 90d supply, fill #0

## 2021-12-13 NOTE — Telephone Encounter (Signed)
Patient overdue for follow-up.  Please contact her to get her scheduled to prevent delay in refills of maintenance medications.

## 2021-12-13 NOTE — Telephone Encounter (Signed)
Patient has been scheduled for January 25, 2022 - Patient asked if you would send in refills to last through this appt date!

## 2021-12-28 ENCOUNTER — Other Ambulatory Visit: Payer: Self-pay | Admitting: Medical-Surgical

## 2021-12-28 ENCOUNTER — Other Ambulatory Visit (HOSPITAL_COMMUNITY): Payer: Self-pay

## 2021-12-28 DIAGNOSIS — K219 Gastro-esophageal reflux disease without esophagitis: Secondary | ICD-10-CM

## 2021-12-28 MED ORDER — PANTOPRAZOLE SODIUM 40 MG PO TBEC
40.0000 mg | DELAYED_RELEASE_TABLET | Freq: Two times a day (BID) | ORAL | 0 refills | Status: DC
  Filled 2021-12-28: qty 90, 45d supply, fill #0

## 2021-12-28 NOTE — Telephone Encounter (Signed)
Please contact patient to schedule an appointment for further refills.   Last seen Jillian Lee 05/04/2021.

## 2021-12-29 NOTE — Telephone Encounter (Signed)
Pt has a f/u scheduled on 7/19 with PCP.

## 2022-01-02 ENCOUNTER — Other Ambulatory Visit (HOSPITAL_COMMUNITY): Payer: Self-pay

## 2022-01-09 ENCOUNTER — Other Ambulatory Visit: Payer: Self-pay | Admitting: Medical-Surgical

## 2022-01-09 ENCOUNTER — Other Ambulatory Visit (HOSPITAL_COMMUNITY): Payer: Self-pay

## 2022-01-09 DIAGNOSIS — I10 Essential (primary) hypertension: Secondary | ICD-10-CM

## 2022-01-11 ENCOUNTER — Other Ambulatory Visit (HOSPITAL_COMMUNITY): Payer: Self-pay

## 2022-01-11 MED ORDER — LISINOPRIL-HYDROCHLOROTHIAZIDE 20-25 MG PO TABS
1.0000 | ORAL_TABLET | Freq: Every day | ORAL | 0 refills | Status: DC
  Filled 2022-01-11: qty 15, 15d supply, fill #0

## 2022-01-12 ENCOUNTER — Other Ambulatory Visit (HOSPITAL_COMMUNITY): Payer: Self-pay

## 2022-01-25 ENCOUNTER — Encounter: Payer: Self-pay | Admitting: Medical-Surgical

## 2022-01-25 ENCOUNTER — Ambulatory Visit: Payer: 59 | Admitting: Medical-Surgical

## 2022-01-25 ENCOUNTER — Other Ambulatory Visit (HOSPITAL_COMMUNITY): Payer: Self-pay

## 2022-01-25 VITALS — BP 126/80 | HR 68 | Resp 20 | Ht <= 58 in | Wt 134.4 lb

## 2022-01-25 DIAGNOSIS — K219 Gastro-esophageal reflux disease without esophagitis: Secondary | ICD-10-CM | POA: Diagnosis not present

## 2022-01-25 DIAGNOSIS — E034 Atrophy of thyroid (acquired): Secondary | ICD-10-CM | POA: Diagnosis not present

## 2022-01-25 DIAGNOSIS — E785 Hyperlipidemia, unspecified: Secondary | ICD-10-CM

## 2022-01-25 DIAGNOSIS — R7303 Prediabetes: Secondary | ICD-10-CM

## 2022-01-25 DIAGNOSIS — B009 Herpesviral infection, unspecified: Secondary | ICD-10-CM | POA: Diagnosis not present

## 2022-01-25 DIAGNOSIS — I10 Essential (primary) hypertension: Secondary | ICD-10-CM

## 2022-01-25 MED ORDER — LISINOPRIL-HYDROCHLOROTHIAZIDE 20-25 MG PO TABS
1.0000 | ORAL_TABLET | Freq: Every day | ORAL | 3 refills | Status: DC
  Filled 2022-01-25: qty 90, 90d supply, fill #0
  Filled 2022-04-24: qty 90, 90d supply, fill #1
  Filled 2022-07-24: qty 90, 90d supply, fill #2
  Filled 2022-10-20: qty 90, 90d supply, fill #3

## 2022-01-25 MED ORDER — ATORVASTATIN CALCIUM 40 MG PO TABS
40.0000 mg | ORAL_TABLET | Freq: Every day | ORAL | 3 refills | Status: DC
  Filled 2022-01-25: qty 90, 90d supply, fill #0

## 2022-01-25 NOTE — Progress Notes (Signed)
Established Patient Office Visit  Subjective   Patient ID: Jillian Lee, female   DOB: Nov 14, 1975 Age: 47 y.o. MRN: [id number]   Chief Complaint  Patient presents with   Medication Refill    HPI Pleasant 46 year old female presenting today for the following:  Hypertension: Taking lisinopril-hydrochlorothiazide 20-25 mg daily, tolerating well with without side effects.  Is not monitoring blood pressure at home.  Follows a low-sodium diet although she does admit to adding salt every now and then.  Working on staying active on a regular basis.  Has lost approximately 10 pounds since our last visit.  Hyperlipidemia: Taking atorvastatin 40 mg daily, tolerating well without side effects.  Following a low-fat diet.  Hypothyroidism: Taking levothyroxine 50 mcg daily as prescribed, tolerating well.  She does take this first thing in the morning, by itself with a full glass of water and avoids taking medications for approximately 2 hours after.  Prediabetes: Has been limiting intake of concentrated sweets and simple carbohydrates.  GERD: Taking Protonix 40 mg twice daily as needed.  Feels that symptoms are well controlled on this regimen.  HSV: Has Valtrex 500 mg daily for suppressive therapy.  Tolerates this well with no significant side effects.   Objective:    Vitals:   01/25/22 0933  BP: 126/80  Pulse: 68  Resp: 20  Height: 4\' 9"  (1.448 m)  Weight: 134 lb 6.4 oz (61 kg)  SpO2: 98%  BMI (Calculated): 29.08    Physical Exam Vitals and nursing note reviewed.  Constitutional:      General: She is not in acute distress.    Appearance: Normal appearance. She is not ill-appearing.  HENT:     Head: Normocephalic and atraumatic.  Cardiovascular:     Rate and Rhythm: Normal rate and regular rhythm.     Pulses: Normal pulses.     Heart sounds: Normal heart sounds.  Pulmonary:     Effort: Pulmonary effort is normal. No respiratory distress.     Breath sounds: Normal breath  sounds. No wheezing, rhonchi or rales.  Skin:    General: Skin is warm and dry.  Neurological:     Mental Status: She is alert and oriented to person, place, and time.  Psychiatric:        Mood and Affect: Mood normal.        Behavior: Behavior normal.        Thought Content: Thought content normal.        Judgment: Judgment normal.   No results found for this or any previous visit (from the past 24 hour(s)).     The 10-year ASCVD risk score (Arnett DK, et al., 2019) is: 1.5%   Values used to calculate the score:     Age: 57 years     Sex: Female     Is Non-Hispanic African American: No     Diabetic: No     Tobacco smoker: No     Systolic Blood Pressure: 126 mmHg     Is BP treated: Yes     HDL Cholesterol: 57 mg/dL     Total Cholesterol: 229 mg/dL   Assessment & Plan:   1. Prediabetes Checking A1c.  - Hemoglobin A1c  2. Hyperlipidemia, unspecified hyperlipidemia type Checking labs today.  Continue atorvastatin 40 mg daily. - COMPLETE METABOLIC PANEL WITH GFR - Lipid panel  3. Gastroesophageal reflux disease, unspecified whether esophagitis present Stable.  Continue pantoprazole 40 mg twice daily as needed.  Advised limiting this medication  and only using it at the lowest effective dose to prevent worsening of osteoporosis risk.  4. Hypothyroidism due to acquired atrophy of thyroid Continue levothyroxine 50 mcg daily.  5. Essential hypertension, benign Blood pressure at goal.  Checking labs as below.  Continue lisinopril-hydrochlorothiazide 20-25 mg daily. - COMPLETE METABOLIC PANEL WITH GFR - Lipid panel - lisinopril-hydrochlorothiazide (ZESTORETIC) 20-25 MG tablet; Take 1 tablet by mouth daily.  Dispense: 90 tablet; Refill: 3  6. HSV infection Continue Valtrex 500 mg daily for suppression.  Return in about 6 months (around 07/28/2022) for chronic disease follow up.  ___________________________________________ Thayer Ohm, DNP, APRN, FNP-BC Primary Care and  Sports Medicine [facility]

## 2022-01-26 LAB — COMPLETE METABOLIC PANEL WITH GFR
AG Ratio: 1.7 (calc) (ref 1.0–2.5)
ALT: 23 U/L (ref 6–29)
AST: 17 U/L (ref 10–35)
Albumin: 4.1 g/dL (ref 3.6–5.1)
Alkaline phosphatase (APISO): 62 U/L (ref 31–125)
BUN: 12 mg/dL (ref 7–25)
CO2: 23 mmol/L (ref 20–32)
Calcium: 9.2 mg/dL (ref 8.6–10.2)
Chloride: 102 mmol/L (ref 98–110)
Creat: 0.96 mg/dL (ref 0.50–0.99)
Globulin: 2.4 g/dL (calc) (ref 1.9–3.7)
Glucose, Bld: 85 mg/dL (ref 65–139)
Potassium: 3.8 mmol/L (ref 3.5–5.3)
Sodium: 137 mmol/L (ref 135–146)
Total Bilirubin: 0.4 mg/dL (ref 0.2–1.2)
Total Protein: 6.5 g/dL (ref 6.1–8.1)
eGFR: 73 mL/min/{1.73_m2} (ref >=60)

## 2022-01-26 LAB — LIPID PANEL
Cholesterol: 213 mg/dL — ABNORMAL HIGH (ref <200)
HDL: 50 mg/dL (ref >=50)
LDL Cholesterol (Calc): 131 mg/dL (calc) — ABNORMAL HIGH
Non-HDL Cholesterol (Calc): 163 mg/dL (calc) — ABNORMAL HIGH (ref <130)
Total CHOL/HDL Ratio: 4.3 (calc) (ref <5.0)
Triglycerides: 183 mg/dL — ABNORMAL HIGH (ref <150)

## 2022-01-26 LAB — HEMOGLOBIN A1C
Hgb A1c MFr Bld: 6.2 % of total Hgb — ABNORMAL HIGH (ref <5.7)
Mean Plasma Glucose: 131 mg/dL
eAG (mmol/L): 7.3 mmol/L

## 2022-01-27 MED ORDER — ATORVASTATIN CALCIUM 80 MG PO TABS
80.0000 mg | ORAL_TABLET | Freq: Every day | ORAL | 1 refills | Status: DC
  Filled 2022-01-27: qty 90, 90d supply, fill #0
  Filled 2022-05-03: qty 90, 90d supply, fill #1

## 2022-01-27 NOTE — Addendum Note (Signed)
Addended byChristen Butter on: 01/27/2022 06:44 PM   Modules accepted: Orders

## 2022-01-30 ENCOUNTER — Other Ambulatory Visit (HOSPITAL_COMMUNITY): Payer: Self-pay

## 2022-02-02 ENCOUNTER — Other Ambulatory Visit (HOSPITAL_COMMUNITY): Payer: Self-pay

## 2022-02-07 ENCOUNTER — Other Ambulatory Visit (HOSPITAL_COMMUNITY): Payer: Self-pay

## 2022-02-19 ENCOUNTER — Other Ambulatory Visit: Payer: Self-pay | Admitting: Medical-Surgical

## 2022-02-19 DIAGNOSIS — K219 Gastro-esophageal reflux disease without esophagitis: Secondary | ICD-10-CM

## 2022-02-21 ENCOUNTER — Other Ambulatory Visit (HOSPITAL_COMMUNITY): Payer: Self-pay

## 2022-02-22 ENCOUNTER — Other Ambulatory Visit (HOSPITAL_COMMUNITY): Payer: Self-pay

## 2022-02-27 ENCOUNTER — Other Ambulatory Visit (HOSPITAL_COMMUNITY): Payer: Self-pay

## 2022-02-27 ENCOUNTER — Other Ambulatory Visit: Payer: Self-pay | Admitting: Medical-Surgical

## 2022-02-27 DIAGNOSIS — K219 Gastro-esophageal reflux disease without esophagitis: Secondary | ICD-10-CM

## 2022-02-28 ENCOUNTER — Other Ambulatory Visit (HOSPITAL_COMMUNITY): Payer: Self-pay

## 2022-03-01 ENCOUNTER — Other Ambulatory Visit (HOSPITAL_COMMUNITY): Payer: Self-pay

## 2022-03-02 ENCOUNTER — Other Ambulatory Visit: Payer: Self-pay

## 2022-03-02 ENCOUNTER — Other Ambulatory Visit (HOSPITAL_COMMUNITY): Payer: Self-pay

## 2022-03-02 MED ORDER — PANTOPRAZOLE SODIUM 40 MG PO TBEC
40.0000 mg | DELAYED_RELEASE_TABLET | Freq: Two times a day (BID) | ORAL | 0 refills | Status: DC
  Filled 2022-03-02: qty 180, 90d supply, fill #0

## 2022-03-15 ENCOUNTER — Other Ambulatory Visit (HOSPITAL_COMMUNITY): Payer: Self-pay

## 2022-03-31 ENCOUNTER — Other Ambulatory Visit: Payer: Self-pay | Admitting: Medical-Surgical

## 2022-03-31 ENCOUNTER — Other Ambulatory Visit (HOSPITAL_COMMUNITY): Payer: Self-pay

## 2022-03-31 DIAGNOSIS — B009 Herpesviral infection, unspecified: Secondary | ICD-10-CM

## 2022-03-31 MED ORDER — VALACYCLOVIR HCL 500 MG PO TABS
500.0000 mg | ORAL_TABLET | Freq: Every day | ORAL | 0 refills | Status: DC
  Filled 2022-03-31: qty 90, 90d supply, fill #0

## 2022-04-24 ENCOUNTER — Other Ambulatory Visit (HOSPITAL_COMMUNITY): Payer: Self-pay

## 2022-05-03 ENCOUNTER — Other Ambulatory Visit (HOSPITAL_COMMUNITY): Payer: Self-pay

## 2022-05-08 ENCOUNTER — Other Ambulatory Visit: Payer: Self-pay | Admitting: Medical-Surgical

## 2022-05-08 ENCOUNTER — Other Ambulatory Visit (HOSPITAL_COMMUNITY): Payer: Self-pay

## 2022-05-08 DIAGNOSIS — E034 Atrophy of thyroid (acquired): Secondary | ICD-10-CM

## 2022-05-08 MED ORDER — LEVOTHYROXINE SODIUM 50 MCG PO TABS
50.0000 ug | ORAL_TABLET | Freq: Every day | ORAL | 1 refills | Status: DC
  Filled 2022-05-08: qty 90, 90d supply, fill #0
  Filled 2022-08-02: qty 90, 90d supply, fill #1

## 2022-05-09 ENCOUNTER — Encounter: Payer: Self-pay | Admitting: Physician Assistant

## 2022-05-09 ENCOUNTER — Telehealth (INDEPENDENT_AMBULATORY_CARE_PROVIDER_SITE_OTHER): Payer: 59 | Admitting: Physician Assistant

## 2022-05-09 ENCOUNTER — Other Ambulatory Visit (HOSPITAL_COMMUNITY): Payer: Self-pay

## 2022-05-09 VITALS — Ht <= 58 in | Wt 134.0 lb

## 2022-05-09 DIAGNOSIS — J40 Bronchitis, not specified as acute or chronic: Secondary | ICD-10-CM | POA: Diagnosis not present

## 2022-05-09 DIAGNOSIS — J329 Chronic sinusitis, unspecified: Secondary | ICD-10-CM | POA: Diagnosis not present

## 2022-05-09 MED ORDER — METHYLPREDNISOLONE 4 MG PO TBPK
ORAL_TABLET | ORAL | 0 refills | Status: DC
  Filled 2022-05-09: qty 21, 6d supply, fill #0

## 2022-05-09 MED ORDER — AMOXICILLIN-POT CLAVULANATE 875-125 MG PO TABS
1.0000 | ORAL_TABLET | Freq: Two times a day (BID) | ORAL | 0 refills | Status: DC
  Filled 2022-05-09: qty 20, 10d supply, fill #0

## 2022-05-09 NOTE — Progress Notes (Signed)
..  Virtual Visit via Video Note  I connected with Jillian Lee on 05/09/22 at 11:30 AM EDT by a telephone enabled telemedicine application and verified that I am speaking with the correct person using two identifiers.  Location: Patient: home Provider: clinic  .Marland KitchenParticipating in visit:  Patient: Jillian Lee Provider:Linsy Ehresman PA-C   I discussed the limitations of evaluation and management by telemedicine and the availability of in person appointments. The patient expressed understanding and agreed to proceed.  History of Present Illness: Pt is a 46 yo female with hx of bronchitis who calls into the clinic with 9 days of URI symptoms. Her symptoms started after getting the flu shot. She has had cough, congestion, head pressure, sputum production, headache, sinus pressure. No SOB, body aches, or fatigue. Mucinex helps but just not getting better. No history of asthma. Never smoked. Not tested for covid or flu.   .. Active Ambulatory Problems    Diagnosis Date Noted   Calculus of kidney 12/20/2013   Essential hypertension, benign 03/31/2015   Hypothyroidism 03/31/2015   Hyperlipidemia 06/09/2015   GERD (gastroesophageal reflux disease) 12/01/2015   Prediabetes 04/21/2016   HSV infection 08/16/2016   Weight gain 11/02/2017   Resolved Ambulatory Problems    Diagnosis Date Noted   Hypertensive urgency 03/26/2015   Acute bronchitis 06/09/2016   Past Medical History:  Diagnosis Date   History of kidney stones    HSV (herpes simplex virus) infection    Hypercholesterolemia    Hypertension    IDA (iron deficiency anemia)    Pre-diabetes    Reactive airway disease that is not asthma       Observations/Objective: No acute distress Productive cough  .Marland Kitchen Today's Vitals   05/09/22 1114  Weight: 134 lb (60.8 kg)  Height: 4\' 9"  (1.448 m)   Body mass index is 29 kg/m.    Assessment and Plan: Marland KitchenMarland KitchenTemeca was seen today for nasal congestion.  Diagnoses and all orders for  this visit:  Sinobronchitis -     amoxicillin-clavulanate (AUGMENTIN) 875-125 MG tablet; Take 1 tablet by mouth 2 (two) times daily. For 10 days. -     methylPREDNISolone (MEDROL DOSEPAK) 4 MG TBPK tablet; Take as directed by package insert.   Over a week of URI symptoms and now improving Start augmentin and then if not improving add medrol dose pak Continue mucinex Rest and hydrate Follow up as needed or if symptoms not resolving   Follow Up Instructions:    I discussed the assessment and treatment plan with the patient. The patient was provided an opportunity to ask questions and all were answered. The patient agreed with the plan and demonstrated an understanding of the instructions.   The patient was advised to call back or seek an in-person evaluation if the symptoms worsen or if the condition fails to improve as anticipated.  I provided 5 minutes of non-face-to-face time during this encounter.   Iran Planas, PA-C

## 2022-05-09 NOTE — Progress Notes (Signed)
Started last Sunday after getting the flu vac Cough Congestion Head pressure Phlegm  Taking Mucinex fast max Tussinex for cough

## 2022-05-29 ENCOUNTER — Other Ambulatory Visit: Payer: Self-pay | Admitting: Osteopathic Medicine

## 2022-06-04 ENCOUNTER — Encounter: Payer: Self-pay | Admitting: Emergency Medicine

## 2022-06-04 ENCOUNTER — Ambulatory Visit
Admission: EM | Admit: 2022-06-04 | Discharge: 2022-06-04 | Disposition: A | Discharge status: Home / Self Care | Payer: 59 | Attending: Family Medicine | Admitting: Family Medicine

## 2022-06-04 DIAGNOSIS — L509 Urticaria, unspecified: Secondary | ICD-10-CM | POA: Diagnosis not present

## 2022-06-04 DIAGNOSIS — T7840XA Allergy, unspecified, initial encounter: Secondary | ICD-10-CM

## 2022-06-04 MED ORDER — METHYLPREDNISOLONE SODIUM SUCC 125 MG IJ SOLR: 80.0000 mg | Freq: Once | INTRAMUSCULAR | Status: DC

## 2022-06-04 NOTE — Discharge Instructions (Signed)
Antihistamines are the best medicine for the hives and itching.  Take Zyrtec or Claritin during the day.  Double dose (2 pills each morning until gone) May take Benadryl at bedtime.  1 or 2 pills of the 25 mg Your doctor if not improving by next week

## 2022-06-04 NOTE — ED Provider Notes (Signed)
[facility]    CSN: [id number] Arrival date & time: 06/04/22  4008      History   Chief Complaint Chief Complaint  Patient presents with   Rash    HPI Jillian Lee is a 46 y.o. female.   HPI  Patient states that yesterday she started having patches of red itchy bumps on her skin.  Swelling of her hands and her feet.  A feeling of swelling in her throat but no difficulty speaking, breathing, or swallowing.  Symptoms are slightly improved today, but she is coming in because she has developed new patches on her legs. Has never had hives before No known allergies No known exposure to new food, lotion, product, or medication  Past Medical History:  Diagnosis Date   GERD (gastroesophageal reflux disease)    History of kidney stones    HSV (herpes simplex virus) infection    Hypercholesterolemia    Hypertension    Hypothyroidism    IDA (iron deficiency anemia)    Pre-diabetes    Reactive airway disease that is not asthma    per pt pcp in epic 01-08-2019  per had chest discomfort with breathing,  symbicort inhaler given, (01-27-2019  per pt stop inhaler on daily basis condition improved    Patient Active Problem List   Diagnosis Date Noted   Weight gain 11/02/2017   HSV infection 08/16/2016   Prediabetes 04/21/2016   GERD (gastroesophageal reflux disease) 12/01/2015   Hyperlipidemia 06/09/2015   Essential hypertension, benign 03/31/2015   Hypothyroidism 03/31/2015   Calculus of kidney 12/20/2013    Past Surgical History:  Procedure Laterality Date   CYSTOSCOPY/URETEROSCOPY/HOLMIUM LASER/STENT PLACEMENT Left 12/31/2018   Procedure: CYSTOSCOPY/URETEROSCOPY/HOLMIUM LASER/STENT PLACEMENT;LEFT RETROGRADE PYELOGRAM;STONE BASKET EXTRACTION;  Surgeon: Jerilee Field, MD;  Location: [facility];  Service: Urology;  Laterality: Left;   URETEROSCOPY WITH HOLMIUM LASER LITHOTRIPSY Left 01/28/2019   Procedure: URETEROSCOPY WITH HOLMIUM LASER LITHOTRIPSY/ STENT  EXCHANGE;  Surgeon: Jerilee Field, MD;  Location: [facility];  Service: Urology;  Laterality: Left;    OB History   No obstetric history on file.      Home Medications    Prior to Admission medications   Medication Sig Start Date End Date Taking? Authorizing Provider  Ascorbic Acid (VITAMIN C) 1000 MG tablet Take 1,000 mg by mouth daily with lunch. W/Rose Hips   Yes [provider]  aspirin 81 MG chewable tablet Chew 1 tablet (81 mg total) by mouth daily. 04/25/17  Yes Sunnie Nielsen, DO  atorvastatin (LIPITOR) 80 MG tablet Take 1 tablet (80 mg total) by mouth daily. 01/27/22  Yes Christen Butter, NP  ferrous sulfate 325 (65 FE) MG tablet Take 1 tablet (325 mg total) by mouth daily. 12/26/19  Yes Sunnie Nielsen, DO  ipratropium (ATROVENT) 0.06 % nasal spray Place 2 sprays into the nose 4 (four) times daily as needed for sinus congestion. 12/13/20  Yes Sunnie Nielsen, DO  levonorgestrel-ethinyl estradiol (LARISSIA) 0.1-20 MG-MCG tablet Take 1 tablet by mouth daily. 05/04/21  Yes Christen Butter, NP  levothyroxine (SYNTHROID) 50 MCG tablet Take 1 tablet (50 mcg total) by mouth daily before breakfast. 05/08/22  Yes Jessup, Joy, NP  lisinopril-hydrochlorothiazide (ZESTORETIC) 20-25 MG tablet Take 1 tablet by mouth daily. 01/25/22  Yes Christen Butter, NP  Multiple Vitamins-Minerals (MULTIVITAMIN WITH MINERALS) tablet Take 1 tablet by mouth daily with lunch.    Yes [provider]  pantoprazole (PROTONIX) 40 MG tablet Take 1 tablet (40 mg total) by mouth  2 (two) times daily before a meal. 03/02/22  Yes Jessup, Joy, NP  valACYclovir (VALTREX) 500 MG tablet Take 1 tablet (500 mg total) by mouth daily. OVERDUE FOR FOLLOW UP APPT! 03/31/22  Yes Christen Butter, NP  [employer] MAXIMUM REDNESS RELIEF 0.03-0.5 % SOLN Place 1 drop into both eyes 3 (three) times daily.    [provider]  nitroGLYCERIN (NITROSTAT) 0.3 MG SL tablet Place 1 tablet (0.3 mg total) under the tongue  every 5 (five) minutes as needed for chest pain. Patient not taking: Reported on 05/09/2022 03/26/15   Sunnie Nielsen, DO    Family History Family History  Problem Relation Age of Onset   Anxiety disorder Mother    Hypertension Mother    Dementia Mother    Alcohol abuse Mother    Depression Mother    Stroke Mother    Alcohol abuse Maternal Uncle    Diabetes Maternal Uncle    Hypertension Maternal Uncle    Cancer Maternal Grandmother    Diabetes Maternal Grandmother     Social History Social History   Tobacco Use   Smoking status: Never   Smokeless tobacco: Never  Vaping Use   Vaping Use: Never used  Substance Use Topics   Alcohol use: Yes    Comment: 6-8 ciders every other weekend   Drug use: Never     Allergies   Clarithromycin   Review of Systems Review of Systems  See HPI Physical Exam Triage Vital Signs ED Triage Vitals  Enc Vitals Group     BP 06/04/22 0841 124/80     Pulse Rate 06/04/22 0841 87     Resp 06/04/22 0841 16     Temp 06/04/22 0841 98.7 F (37.1 C)     Temp Source 06/04/22 0841 Oral     SpO2 06/04/22 0841 97 %     Weight --      Height --      Head Circumference --      Peak Flow --      Pain Score 06/04/22 0837 0     Pain Loc --      Pain Edu? --      Excl. in GC? --    No data found.  Updated Vital Signs BP 124/80 (BP Location: Left Arm)   Pulse 87   Temp 98.7 F (37.1 C) (Oral)   Resp 16   SpO2 97%      Physical Exam Constitutional:      General: She is not in acute distress.    Appearance: She is well-developed. She is not ill-appearing.  HENT:     Head: Normocephalic and atraumatic.     Mouth/Throat:     Mouth: Mucous membranes are moist.     Pharynx: Oropharynx is clear.  Eyes:     Conjunctiva/sclera: Conjunctivae normal.     Pupils: Pupils are equal, round, and reactive to light.  Cardiovascular:     Rate and Rhythm: Normal rate.  Pulmonary:     Effort: Pulmonary effort is normal. No respiratory  distress.     Breath sounds: No wheezing.  Abdominal:     General: There is no distension.     Palpations: Abdomen is soft.  Musculoskeletal:        General: Normal range of motion.     Cervical back: Normal range of motion.  Skin:    General: Skin is warm and dry.     Findings: Rash present.     Comments: Positive  for urticarial wheals are noted on abdomen, arms and legs.  Neurological:     Mental Status: She is alert.      UC Treatments / Results  Labs (all labs ordered are listed, but only abnormal results are displayed) Labs Reviewed - No data to display  EKG   Radiology No results found.  Procedures Procedures (including critical care time)  Medications Ordered in UC Medications - No data to display  Initial Impression / Assessment and Plan / UC Course  I have reviewed the triage vital signs and the nursing notes.  Pertinent labs & imaging results that were available during my care of the patient were reviewed by me and considered in my medical decision making (see chart for details).     Final Clinical Impressions(s) / UC Diagnoses   Final diagnoses:  Urticaria  Allergic reaction, initial encounter     Discharge Instructions      Antihistamines are the best medicine for the hives and itching.  Take Zyrtec or Claritin during the day.  Double dose (2 pills each morning until gone) May take Benadryl at bedtime.  1 or 2 pills of the 25 mg Your doctor if not improving by next week   ED Prescriptions   None    PDMP not reviewed this encounter.   Eustace Moore, MD 06/04/22 [phone]

## 2022-06-04 NOTE — ED Triage Notes (Addendum)
Patient presents to Urgent Care with complaints of itching of bottom of feet, palm of hands, rash and swollen tongue since 1 day ago. Patient reports all symptoms started yesterday but has improved today. Still having some itching of hands. Did not take any medication for symptoms.

## 2022-06-05 ENCOUNTER — Other Ambulatory Visit: Payer: Self-pay | Admitting: Medical-Surgical

## 2022-06-05 ENCOUNTER — Other Ambulatory Visit (HOSPITAL_COMMUNITY): Payer: Self-pay

## 2022-06-05 ENCOUNTER — Telehealth: Payer: Self-pay

## 2022-06-05 DIAGNOSIS — K219 Gastro-esophageal reflux disease without esophagitis: Secondary | ICD-10-CM

## 2022-06-05 MED ORDER — IPRATROPIUM BROMIDE 0.06 % NA SOLN
2.0000 | Freq: Four times a day (QID) | NASAL | 1 refills | Status: DC | PRN
  Filled 2022-06-05: qty 15, 11d supply, fill #0

## 2022-06-05 MED ORDER — PANTOPRAZOLE SODIUM 40 MG PO TBEC
40.0000 mg | DELAYED_RELEASE_TABLET | Freq: Two times a day (BID) | ORAL | 0 refills | Status: DC
  Filled 2022-06-05: qty 180, 90d supply, fill #0

## 2022-06-05 NOTE — Telephone Encounter (Signed)
TC to f/u with pt after yesterday's visit to [facility]. Pt reports "breaking out" again this morning; had Solumedrol IM yesterday for rash per Dr. Delton See. She states she took some Zyrtec and has not seen improvement, so she was thinking of calling her PCP to be tested for tickborne illnesses. Advised to f/u with PCP for no improvement w/ rash and she verbalizes understanding.

## 2022-06-07 ENCOUNTER — Other Ambulatory Visit (HOSPITAL_COMMUNITY): Payer: Self-pay

## 2022-06-14 ENCOUNTER — Ambulatory Visit: Payer: 59 | Admitting: Medical-Surgical

## 2022-06-20 ENCOUNTER — Other Ambulatory Visit (HOSPITAL_COMMUNITY): Payer: Self-pay

## 2022-06-20 ENCOUNTER — Other Ambulatory Visit: Payer: Self-pay | Admitting: Medical-Surgical

## 2022-06-20 DIAGNOSIS — Z3041 Encounter for surveillance of contraceptive pills: Secondary | ICD-10-CM

## 2022-06-20 MED ORDER — LEVONORGESTREL-ETHINYL ESTRAD 0.1-20 MG-MCG PO TABS
1.0000 | ORAL_TABLET | Freq: Every day | ORAL | 1 refills | Status: DC
  Filled 2022-06-20: qty 84, 84d supply, fill #0

## 2022-07-21 ENCOUNTER — Telehealth: Payer: Self-pay | Admitting: Medical-Surgical

## 2022-07-21 NOTE — Telephone Encounter (Signed)
FMLA papers received via fax. Please contact patient to schedule a visit (virtual is fine) so we can go over the forms and get them filled out accurately.   ___________________________________________ Clearnce Sorrel, DNP, APRN, FNP-BC [facility] [place]

## 2022-07-24 ENCOUNTER — Other Ambulatory Visit: Payer: Self-pay

## 2022-07-25 ENCOUNTER — Other Ambulatory Visit (HOSPITAL_COMMUNITY): Payer: Self-pay

## 2022-07-25 ENCOUNTER — Telehealth (INDEPENDENT_AMBULATORY_CARE_PROVIDER_SITE_OTHER): Payer: Commercial Managed Care - PPO | Admitting: Medical-Surgical

## 2022-07-25 ENCOUNTER — Encounter: Payer: Self-pay | Admitting: Medical-Surgical

## 2022-07-25 DIAGNOSIS — R079 Chest pain, unspecified: Secondary | ICD-10-CM | POA: Diagnosis not present

## 2022-07-25 DIAGNOSIS — Z0289 Encounter for other administrative examinations: Secondary | ICD-10-CM

## 2022-07-25 MED ORDER — NITROGLYCERIN 0.3 MG SL SUBL
0.3000 mg | SUBLINGUAL_TABLET | SUBLINGUAL | 12 refills | Status: DC | PRN
  Filled 2022-07-25: qty 100, 1d supply, fill #0

## 2022-07-25 NOTE — Progress Notes (Signed)
Virtual Visit via Video Note  I connected with Jillian Lee on 07/25/22 at 11:10 AM EST by a video enabled telemedicine application and verified that I am speaking with the correct person using two identifiers.   I discussed the limitations of evaluation and management by telemedicine and the availability of in person appointments. The patient expressed understanding and agreed to proceed.  Patient location: home Provider locations: office  Subjective:    CC: FMLA  HPI: Very pleasant 47 year old female presenting via MyChart video visit to complete FMLA forms.  She does have a history of recurrent kidney stones that cause episodic flares and need for medical intervention.  She is under the care of urology and sees them on an as-needed basis.  Has a history of chest pain intermittently.  Notes her last episode of chest pain was yesterday but feels this is more related to uncontrolled anxiety.  Once she was able to calm herself down, the chest pain resolved.  She was prescribed a prescription for Nitrostat by her previous PCP and reports that she has not had to take the medicine but likes to have it on hand just in case.  Unfortunately, her pills are several years old and have disintegrated.  Requesting a refill.  Past medical history, Surgical history, Family history not pertinant except as noted below, Social history, Allergies, and medications have been entered into the medical record, reviewed, and corrections made.   Review of Systems: See HPI for pertinent positives and negatives.   Objective:    General: Speaking clearly in complete sentences without any shortness of breath.  Alert and oriented x3.  Normal judgment. No apparent acute distress.  Impression and Recommendations:    1. Encounter for completion of form with patient Reviewed last year's FMLA forms.  The information remains consistent and no alterations needed.  FMLA forms completed and faxed today.  [employer] will be  our front desk for patient pickup at her earliest convenience.  2. Chest pain, unspecified type Refilling nitroglycerin. - nitroGLYCERIN (NITROSTAT) 0.3 MG SL tablet; Place 1 tablet (0.3 mg total) under the tongue every 5 (five) minutes as needed for chest pain.  Dispense: 15 tablet; Refill: 12  I discussed the assessment and treatment plan with the patient. The patient was provided an opportunity to ask questions and all were answered. The patient agreed with the plan and demonstrated an understanding of the instructions.   The patient was advised to call back or seek an in-person evaluation if the symptoms worsen or if the condition fails to improve as anticipated.  25 minutes of non-face-to-face time was provided during this encounter.  Return for 9-month follow-up as scheduled.  Clearnce Sorrel, DNP, APRN, FNP-BC [place] [facility]

## 2022-07-26 ENCOUNTER — Other Ambulatory Visit (HOSPITAL_COMMUNITY): Payer: Self-pay

## 2022-07-26 ENCOUNTER — Other Ambulatory Visit: Payer: Self-pay | Admitting: Medical-Surgical

## 2022-07-28 ENCOUNTER — Ambulatory Visit: Payer: 59 | Admitting: Medical-Surgical

## 2022-08-02 ENCOUNTER — Other Ambulatory Visit: Payer: Self-pay | Admitting: Medical-Surgical

## 2022-08-02 ENCOUNTER — Other Ambulatory Visit: Payer: Self-pay

## 2022-08-02 ENCOUNTER — Other Ambulatory Visit (HOSPITAL_COMMUNITY): Payer: Self-pay

## 2022-08-02 DIAGNOSIS — E785 Hyperlipidemia, unspecified: Secondary | ICD-10-CM

## 2022-08-02 MED ORDER — ATORVASTATIN CALCIUM 80 MG PO TABS
80.0000 mg | ORAL_TABLET | Freq: Every day | ORAL | 1 refills | Status: DC
  Filled 2022-08-02: qty 90, 90d supply, fill #0
  Filled 2022-11-01: qty 90, 90d supply, fill #1

## 2022-08-03 ENCOUNTER — Other Ambulatory Visit: Payer: Self-pay | Admitting: Medical-Surgical

## 2022-08-03 ENCOUNTER — Other Ambulatory Visit (HOSPITAL_COMMUNITY): Payer: Self-pay

## 2022-08-03 DIAGNOSIS — B009 Herpesviral infection, unspecified: Secondary | ICD-10-CM

## 2022-08-04 ENCOUNTER — Other Ambulatory Visit (HOSPITAL_COMMUNITY): Payer: Self-pay

## 2022-08-04 MED ORDER — VALACYCLOVIR HCL 500 MG PO TABS
500.0000 mg | ORAL_TABLET | Freq: Every day | ORAL | 0 refills | Status: DC
  Filled 2022-08-04: qty 90, 90d supply, fill #0

## 2022-08-23 ENCOUNTER — Encounter: Payer: Self-pay | Admitting: Medical-Surgical

## 2022-08-23 ENCOUNTER — Other Ambulatory Visit (HOSPITAL_COMMUNITY): Payer: Self-pay

## 2022-08-23 ENCOUNTER — Ambulatory Visit: Payer: Commercial Managed Care - PPO | Admitting: Medical-Surgical

## 2022-08-23 VITALS — BP 122/72 | HR 72 | Resp 20 | Ht <= 58 in | Wt 132.3 lb

## 2022-08-23 DIAGNOSIS — K219 Gastro-esophageal reflux disease without esophagitis: Secondary | ICD-10-CM

## 2022-08-23 DIAGNOSIS — I10 Essential (primary) hypertension: Secondary | ICD-10-CM

## 2022-08-23 DIAGNOSIS — Z1211 Encounter for screening for malignant neoplasm of colon: Secondary | ICD-10-CM | POA: Diagnosis not present

## 2022-08-23 DIAGNOSIS — E034 Atrophy of thyroid (acquired): Secondary | ICD-10-CM | POA: Diagnosis not present

## 2022-08-23 DIAGNOSIS — Z3041 Encounter for surveillance of contraceptive pills: Secondary | ICD-10-CM

## 2022-08-23 DIAGNOSIS — R7303 Prediabetes: Secondary | ICD-10-CM

## 2022-08-23 DIAGNOSIS — E785 Hyperlipidemia, unspecified: Secondary | ICD-10-CM | POA: Diagnosis not present

## 2022-08-23 MED ORDER — LEVONORGESTREL-ETHINYL ESTRAD 0.1-20 MG-MCG PO TABS
1.0000 | ORAL_TABLET | Freq: Every day | ORAL | 1 refills | Status: DC
  Filled 2022-08-23: qty 84, 84d supply, fill #0
  Filled 2022-11-28: qty 84, 84d supply, fill #1

## 2022-08-23 NOTE — Progress Notes (Signed)
Established Patient Office Visit  Subjective   Patient ID: Jillian Lee, female   DOB: Jan 03, 1976 Age: 47 y.o. MRN: [id number]   Chief Complaint  Patient presents with   Follow-up   HPI Pleasant 47 year old female presenting today for the following:   HTN: taking Lisinopril-HCTZ 20-37m daily, tolerating well without side effects. Checking her BP at home with readings reported at or below goal. Limits dietary sodium. Now back to exercising regularly. Denies CP, SOB, palpitations, lower extremity edema, dizziness, headaches, or vision changes.  HLD: doing well on Lipitor 842mdaily. Following a low fat diet.   Taking [place] birth control. Tolerating well without side effects. Happy with this method and requesting refills. Denies missed doses.   Hypothyroidism: taking Levothyroxine 5027mdaily, tolerating well. Denies concerning symptoms.  Continues to take Valtrex 500m75mily for prophylaxis against HSV outbreaks. The medication is working well and she has no current concerns.  GERD: taking Protonix 40mg55mce daily most days. Occasionally forgets her afternoon dose and notes that her symptoms will flare if she is not careful with her dietary intake.    Objective:    Vitals:   08/23/22 0940  BP: 122/72  Pulse: 72  Resp: 20  Height: 4' 9"$  (1.448 m)  Weight: 132 lb 4.8 oz (60 kg)  SpO2: 99%  BMI (Calculated): 28.62    Physical Exam Vitals reviewed.  Constitutional:      General: She is not in acute distress.    Appearance: Normal appearance. She is not ill-appearing.  HENT:     Head: Normocephalic and atraumatic.  Cardiovascular:     Rate and Rhythm: Normal rate and regular rhythm.     Pulses: Normal pulses.     Heart sounds: Normal heart sounds.  Pulmonary:     Effort: Pulmonary effort is normal. No respiratory distress.     Breath sounds: Normal breath sounds. No wheezing, rhonchi or rales.  Skin:    General: Skin is warm and dry.  Neurological:      Mental Status: She is alert and oriented to person, place, and time.  Psychiatric:        Mood and Affect: Mood normal.        Behavior: Behavior normal.        Thought Content: Thought content normal.        Judgment: Judgment normal.   No results found for this or any previous visit (from the past 24 hour(s)).     The 10-year ASCVD risk score (Arnett DK, et al., 2019) is: 1.6%   Values used to calculate the score:     Age: 56 ye7s     Sex: Female     Is Non-Hispanic African American: No     Diabetic: No     Tobacco smoker: No     Systolic Blood Pressure: 122 [id number]     Is BP treated: Yes     HDL Cholesterol: 50 mg/dL     Total Cholesterol: 213 mg/dL   Assessment & Plan:   1. Essential hypertension, benign Checking CBC.  Blood pressure at goal today.  Continue lisinopril-hydrochlorothiazide as prescribed. - CBC with Differential/Platelet  2. Gastroesophageal reflux disease, unspecified whether esophagitis present Continue Protonix 40 mg twice daily.  Advised to avoid food triggers and work to reduce daily dosing to the lowest effective dose.  3. Hypothyroidism due to acquired atrophy of thyroid Checking TSH.  Continue levothyroxine 50 mcg daily. - TSH  4. Hyperlipidemia, unspecified hyperlipidemia type  Continue Lipitor 80 mg daily.  5. Prediabetes Checking A1c. - Hemoglobin A1c  6. Encounter for surveillance of contraceptive pills Continue Larissia as prescribed. - levonorgestrel-ethinyl estradiol (LARISSIA) 0.1-20 MG-MCG tablet; Take 1 tablet by mouth daily.  Dispense: 84 tablet; Refill: 1  7. Colon cancer screening Due for repeat of Cologuard.  Ordered today. - Cologuard   Return in about 6 months (around 02/21/2023) for annual physical exam or sooner if needed.  ___________________________________________ Clearnce Sorrel, DNP, APRN, FNP-BC Primary [facility] Sports Medicine [place]

## 2022-08-24 LAB — CBC WITH DIFFERENTIAL/PLATELET
Absolute Monocytes: 538 cells/uL (ref 200–950)
Basophils Absolute: 50 cells/uL (ref 0–200)
Basophils Relative: 0.6 %
Eosinophils Absolute: 101 cells/uL (ref 15–500)
Eosinophils Relative: 1.2 %
HCT: 44 % (ref 35.0–45.0)
Hemoglobin: 15.3 g/dL (ref 11.7–15.5)
Lymphs Abs: 2352 cells/uL (ref 850–3900)
MCH: 32.3 pg (ref 27.0–33.0)
MCHC: 34.8 g/dL (ref 32.0–36.0)
MCV: 92.8 fL (ref 80.0–100.0)
MPV: 9.1 fL (ref 7.5–12.5)
Monocytes Relative: 6.4 %
Neutro Abs: 5359 cells/uL (ref 1500–7800)
Neutrophils Relative %: 63.8 %
Platelets: 285 10*3/uL (ref 140–400)
RBC: 4.74 10*6/uL (ref 3.80–5.10)
RDW: 12 % (ref 11.0–15.0)
Total Lymphocyte: 28 %
WBC: 8.4 10*3/uL (ref 3.8–10.8)

## 2022-08-24 LAB — HEMOGLOBIN A1C
Hgb A1c MFr Bld: 6.2 % of total Hgb — ABNORMAL HIGH (ref <5.7)
Mean Plasma Glucose: 131 mg/dL
eAG (mmol/L): 7.3 mmol/L

## 2022-08-24 LAB — TSH: TSH: 7.97 mIU/L — ABNORMAL HIGH

## 2022-08-24 NOTE — Addendum Note (Signed)
Addended bySamuel Bouche on: 08/24/2022 05:15 PM   Modules accepted: Orders

## 2022-08-25 ENCOUNTER — Other Ambulatory Visit (HOSPITAL_COMMUNITY): Payer: Self-pay

## 2022-08-28 ENCOUNTER — Other Ambulatory Visit (HOSPITAL_COMMUNITY): Payer: Self-pay

## 2022-09-14 ENCOUNTER — Other Ambulatory Visit: Payer: Self-pay | Admitting: Medical-Surgical

## 2022-09-14 DIAGNOSIS — K219 Gastro-esophageal reflux disease without esophagitis: Secondary | ICD-10-CM

## 2022-09-15 ENCOUNTER — Other Ambulatory Visit (HOSPITAL_COMMUNITY): Payer: Self-pay

## 2022-09-15 MED ORDER — PANTOPRAZOLE SODIUM 40 MG PO TBEC
40.0000 mg | DELAYED_RELEASE_TABLET | Freq: Two times a day (BID) | ORAL | 0 refills | Status: DC
  Filled 2022-09-15: qty 180, 90d supply, fill #0

## 2022-10-20 ENCOUNTER — Other Ambulatory Visit (HOSPITAL_COMMUNITY): Payer: Self-pay

## 2022-10-23 ENCOUNTER — Other Ambulatory Visit (HOSPITAL_COMMUNITY): Payer: Self-pay

## 2022-11-01 ENCOUNTER — Other Ambulatory Visit: Payer: Self-pay | Admitting: Medical-Surgical

## 2022-11-01 ENCOUNTER — Other Ambulatory Visit (HOSPITAL_COMMUNITY): Payer: Self-pay

## 2022-11-01 DIAGNOSIS — E034 Atrophy of thyroid (acquired): Secondary | ICD-10-CM

## 2022-11-01 MED ORDER — LEVOTHYROXINE SODIUM 50 MCG PO TABS
50.0000 ug | ORAL_TABLET | Freq: Every day | ORAL | 1 refills | Status: DC
  Filled 2022-11-01: qty 90, 90d supply, fill #0
  Filled 2023-01-31: qty 90, 90d supply, fill #1

## 2022-11-28 ENCOUNTER — Other Ambulatory Visit: Payer: Self-pay

## 2022-11-28 ENCOUNTER — Other Ambulatory Visit: Payer: Self-pay | Admitting: Medical-Surgical

## 2022-11-28 ENCOUNTER — Other Ambulatory Visit (HOSPITAL_COMMUNITY): Payer: Self-pay

## 2022-11-28 DIAGNOSIS — B009 Herpesviral infection, unspecified: Secondary | ICD-10-CM

## 2022-11-28 MED ORDER — VALACYCLOVIR HCL 500 MG PO TABS
500.0000 mg | ORAL_TABLET | Freq: Every day | ORAL | 0 refills | Status: DC
  Filled 2022-11-28: qty 90, 90d supply, fill #0

## 2022-12-09 ENCOUNTER — Ambulatory Visit (INDEPENDENT_AMBULATORY_CARE_PROVIDER_SITE_OTHER): Payer: PRIVATE HEALTH INSURANCE

## 2022-12-09 ENCOUNTER — Encounter (HOSPITAL_COMMUNITY): Payer: Self-pay | Admitting: Emergency Medicine

## 2022-12-09 ENCOUNTER — Ambulatory Visit (HOSPITAL_COMMUNITY)
Admission: EM | Admit: 2022-12-09 | Discharge: 2022-12-09 | Disposition: A | Discharge status: Home / Self Care | Payer: PRIVATE HEALTH INSURANCE

## 2022-12-09 ENCOUNTER — Other Ambulatory Visit: Payer: Self-pay

## 2022-12-09 DIAGNOSIS — S6991XA Unspecified injury of right wrist, hand and finger(s), initial encounter: Secondary | ICD-10-CM | POA: Diagnosis not present

## 2022-12-09 DIAGNOSIS — M7989 Other specified soft tissue disorders: Secondary | ICD-10-CM | POA: Diagnosis not present

## 2022-12-09 DIAGNOSIS — S6010XA Contusion of unspecified finger with damage to nail, initial encounter: Secondary | ICD-10-CM | POA: Diagnosis not present

## 2022-12-09 MED ORDER — TETANUS-DIPHTH-ACELL PERTUSSIS 5-2.5-18.5 LF-MCG/0.5 IM SUSY
PREFILLED_SYRINGE | INTRAMUSCULAR | Status: AC
  Filled 2022-12-09: qty 0.5

## 2022-12-09 MED ORDER — TETANUS-DIPHTH-ACELL PERTUSSIS 5-2.5-18.5 LF-MCG/0.5 IM SUSY
0.5000 mL | PREFILLED_SYRINGE | Freq: Once | INTRAMUSCULAR | Status: AC
  Administered 2022-12-09: 0.5 mL via INTRAMUSCULAR

## 2022-12-09 NOTE — Discharge Instructions (Addendum)
We have updated your tetanus vaccine today  Please keep the finger clean.  You can wash normally with soap and water.  I recommend to apply antibiotic ointment twice daily (neosporin is fine) Take ibuprofen or Tylenol for pain  Monitor for any signs of infection.  If you notice increased swelling, redness, pain, drainage from the area, please return

## 2022-12-09 NOTE — ED Notes (Addendum)
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

## 2022-12-09 NOTE — ED Triage Notes (Signed)
Linen shoot door slammed shut on patient's finger at 10:30 am today.  Injury to right middle finger.  Appears to have avulsion of tissue to the distal third of posterior distal finger and there is a hematoma under finger nail.  Able to bend finger.

## 2022-12-09 NOTE — ED Provider Notes (Signed)
[facility]    CSN: [id number] Arrival date & time: 12/09/22  1259     History   Chief Complaint Chief Complaint  Patient presents with   Finger Injury   workers comp    HPI Jillian Lee is a 47 y.o. female.  Here with finger injury, right hand middle Slammed in metal linen door this morning at work Throbbing 5-6/10 pain. No meds taken yet, she came right to UC She did wash with peroxide after incident, dressed with Vaseline gauze  Last tetanus unknown by pt, chart review last documented 2014  Past Medical History:  Diagnosis Date   GERD (gastroesophageal reflux disease)    History of kidney stones    HSV (herpes simplex virus) infection    Hypercholesterolemia    Hypertension    Hypothyroidism    IDA (iron deficiency anemia)    Pre-diabetes    Reactive airway disease that is not asthma    per pt pcp in epic 01-08-2019  per had chest discomfort with breathing,  symbicort inhaler given, (01-27-2019  per pt stop inhaler on daily basis condition improved    Patient Active Problem List   Diagnosis Date Noted   Weight gain 11/02/2017   HSV infection 08/16/2016   Prediabetes 04/21/2016   GERD (gastroesophageal reflux disease) 12/01/2015   Hyperlipidemia 06/09/2015   Essential hypertension, benign 03/31/2015   Hypothyroidism 03/31/2015   Calculus of kidney 12/20/2013    Past Surgical History:  Procedure Laterality Date   CYSTOSCOPY/URETEROSCOPY/HOLMIUM LASER/STENT PLACEMENT Left 12/31/2018   Procedure: CYSTOSCOPY/URETEROSCOPY/HOLMIUM LASER/STENT PLACEMENT;LEFT RETROGRADE PYELOGRAM;STONE BASKET EXTRACTION;  Surgeon: Jerilee Field, MD;  Location: [facility];  Service: Urology;  Laterality: Left;   URETEROSCOPY WITH HOLMIUM LASER LITHOTRIPSY Left 01/28/2019   Procedure: URETEROSCOPY WITH HOLMIUM LASER LITHOTRIPSY/ STENT EXCHANGE;  Surgeon: Jerilee Field, MD;  [facility]: [facility];  Service: Urology;  Laterality: Left;    OB History    No obstetric history on file.      Home Medications    Prior to Admission medications   Medication Sig Start Date End Date Taking? Authorizing Provider  Ascorbic Acid (VITAMIN C) 100 MG tablet Take 100 mg by mouth daily.   Yes [provider]  Ascorbic Acid (VITAMIN C) 1000 MG tablet Take 1,000 mg by mouth daily with lunch. W/Rose Hips    [provider]  aspirin 81 MG chewable tablet Chew 1 tablet (81 mg total) by mouth daily. 04/25/17   Sunnie Nielsen, DO  atorvastatin (LIPITOR) 80 MG tablet Take 1 tablet (80 mg total) by mouth daily. 08/02/22   Christen Butter, NP  CO-ENZYME Q10 PO Take 400 mg by mouth 1 day or 1 dose.    [provider]  [employer]S MAXIMUM REDNESS RELIEF 0.03-0.5 % SOLN Place 1 drop into both eyes 3 (three) times daily.    [provider]  ferrous sulfate 325 (65 FE) MG tablet Take 1 tablet (325 mg total) by mouth daily. 12/26/19   Sunnie Nielsen, DO  ipratropium (ATROVENT) 0.06 % nasal spray Place 2 sprays into the nose 4 (four) times daily as needed for sinus congestion. 06/05/22   Christen Butter, NP  levonorgestrel-ethinyl estradiol (LARISSIA) 0.1-20 MG-MCG tablet Take 1 tablet by mouth daily. 08/23/22   Christen Butter, NP  levothyroxine (SYNTHROID) 50 MCG tablet Take 1 tablet (50 mcg total) by mouth daily before breakfast. 11/01/22   Christen Butter, NP  lisinopril-hydrochlorothiazide (ZESTORETIC) 20-25 MG tablet Take 1 tablet by mouth daily. 01/25/22  Christen Butter, NP  Multiple Vitamins-Minerals (MULTIVITAMIN WITH MINERALS) tablet Take 1 tablet by mouth daily with lunch.     [provider]  nitroGLYCERIN (NITROSTAT) 0.3 MG SL tablet Place 1 tablet (0.3 mg total) under the tongue every 5 (five) minutes as needed for chest pain. 07/25/22   Christen Butter, NP  pantoprazole (PROTONIX) 40 MG tablet Take 1 tablet (40 mg total) by mouth 2 (two) times daily before a meal. 09/15/22   Christen Butter, NP  Turmeric 450 MG CAPS Take 450 mg by mouth 1 day or 1  dose.    [provider]  valACYclovir (VALTREX) 500 MG tablet Take 1 tablet (500 mg total) by mouth daily. 11/28/22   Christen Butter, NP    Family History Family History  Problem Relation Age of Onset   Anxiety disorder Mother    Hypertension Mother    Dementia Mother    Alcohol abuse Mother    Depression Mother    Stroke Mother    Cancer Maternal Grandmother    Diabetes Maternal Grandmother    Alcohol abuse Maternal Uncle    Diabetes Maternal Uncle    Hypertension Maternal Uncle     Social History Social History   Tobacco Use   Smoking status: Never   Smokeless tobacco: Never  Vaping Use   Vaping Use: Never used  Substance Use Topics   Alcohol use: Yes    Comment: 6-8 ciders every other weekend   Drug use: Never     Allergies   Clarithromycin   Review of Systems Review of Systems As per HPI  Physical Exam Triage Vital Signs ED Triage Vitals  Enc Vitals Group     BP 12/09/22 1416 (!) 144/81     Pulse Rate 12/09/22 1416 85     Resp 12/09/22 1416 18     Temp 12/09/22 1416 98.5 F (36.9 C)     Temp Source 12/09/22 1416 Oral     SpO2 12/09/22 1416 95 %     Weight --      Height --      Head Circumference --      Peak Flow --      Pain Score 12/09/22 1412 6     Pain Loc --      Pain Edu? --      Excl. in GC? --    No data found.  Updated Vital Signs BP (!) 144/81 (BP Location: Left Arm)   Pulse 85   Temp 98.5 F (36.9 C) (Oral)   Resp 18   LMP 12/02/2022 (Approximate)   SpO2 95%    Physical Exam Vitals and nursing note reviewed.  Constitutional:      General: She is not in acute distress.    Appearance: Normal appearance.  HENT:     Mouth/Throat:     Pharynx: Oropharynx is clear.  Cardiovascular:     Rate and Rhythm: Normal rate and regular rhythm.     Pulses: Normal pulses.  Pulmonary:     Effort: Pulmonary effort is normal.  Skin:    Findings: Wound present.     Comments: Two small half mm lacerations at the distal middle  finger, just below the nail. Not bleeding. Shallow, well approximated. There is a small subungual hematoma, about 15% of the nail. No external nail damage  Neurological:     Mental Status: She is alert and oriented to person, place, and time.     Comments: Distal sensation intact. Cap refill <  2 seconds     UC Treatments / Results  Labs (all labs ordered are listed, but only abnormal results are displayed) Labs Reviewed - No data to display  EKG  Radiology DG Finger Middle Right  Result Date: 12/09/2022 CLINICAL DATA:  Slammed finger in door today. Subungual hematoma the middle finger. EXAM: RIGHT MIDDLE FINGER 3V COMPARISON:  None Available. FINDINGS: Soft tissue swelling is present in the distal aspect of the right middle finger. No underlying fracture or foreign body is present. The joints are located. The visualized hand is unremarkable. IMPRESSION: Soft tissue swelling without underlying fracture or foreign body. Electronically Signed   By: Marin Roberts M.D.   On: 12/09/2022 14:57    Procedures Procedures   Medications Ordered in UC Medications  Tdap (BOOSTRIX) injection 0.5 mL (has no administration in time range)    Initial Impression / Assessment and Plan / UC Course  I have reviewed the triage vital signs and the nursing notes.  Pertinent labs & imaging results that were available during my care of the patient were reviewed by me and considered in my medical decision making (see chart for details).  Finger xray is negative. Cleaned and dressed wound. Discussed signs of infection to monitor for, wound care at home, return if needed. Tetanus updated today Work note provided No questions at this time  Final Clinical Impressions(s) / UC Diagnoses   Final diagnoses:  Injury of finger of right hand, initial encounter  Subungual hematoma of digit of hand, initial encounter     Discharge Instructions      We have updated your tetanus vaccine today  Please  keep the finger clean.  You can wash normally with soap and water.  I recommend to apply antibiotic ointment twice daily (neosporin is fine) Take ibuprofen or Tylenol for pain  Monitor for any signs of infection.  If you notice increased swelling, redness, pain, drainage from the area, please return    ED Prescriptions   None    PDMP not reviewed this encounter.   Kathrine Haddock 12/09/22 1535

## 2022-12-12 ENCOUNTER — Other Ambulatory Visit (HOSPITAL_COMMUNITY): Payer: Self-pay

## 2022-12-12 MED ORDER — CEPHALEXIN 500 MG PO CAPS
500.0000 mg | ORAL_CAPSULE | Freq: Three times a day (TID) | ORAL | 0 refills | Status: DC
  Filled 2022-12-12: qty 9, 3d supply, fill #0

## 2022-12-24 ENCOUNTER — Other Ambulatory Visit: Payer: Self-pay | Admitting: Medical-Surgical

## 2022-12-24 DIAGNOSIS — K219 Gastro-esophageal reflux disease without esophagitis: Secondary | ICD-10-CM

## 2022-12-26 ENCOUNTER — Other Ambulatory Visit (HOSPITAL_COMMUNITY): Payer: Self-pay

## 2022-12-26 MED ORDER — PANTOPRAZOLE SODIUM 40 MG PO TBEC
40.0000 mg | DELAYED_RELEASE_TABLET | Freq: Two times a day (BID) | ORAL | 0 refills | Status: DC
Start: 2022-12-26 — End: 2023-08-22
  Filled 2022-12-26: qty 180, 90d supply, fill #0

## 2023-01-15 ENCOUNTER — Other Ambulatory Visit (HOSPITAL_COMMUNITY): Payer: Self-pay

## 2023-01-15 ENCOUNTER — Other Ambulatory Visit: Payer: Self-pay | Admitting: Medical-Surgical

## 2023-01-15 DIAGNOSIS — I10 Essential (primary) hypertension: Secondary | ICD-10-CM

## 2023-01-15 MED ORDER — LISINOPRIL-HYDROCHLOROTHIAZIDE 20-25 MG PO TABS
1.0000 | ORAL_TABLET | Freq: Every day | ORAL | 3 refills | Status: DC
Start: 2023-01-15 — End: 2023-08-22
  Filled 2023-01-15: qty 90, 90d supply, fill #0
  Filled 2023-04-19: qty 90, 90d supply, fill #1
  Filled 2023-07-18: qty 90, 90d supply, fill #2

## 2023-01-31 ENCOUNTER — Other Ambulatory Visit: Payer: Self-pay | Admitting: Medical-Surgical

## 2023-01-31 ENCOUNTER — Other Ambulatory Visit (HOSPITAL_COMMUNITY): Payer: Self-pay

## 2023-01-31 DIAGNOSIS — Z3041 Encounter for surveillance of contraceptive pills: Secondary | ICD-10-CM

## 2023-02-06 ENCOUNTER — Other Ambulatory Visit (HOSPITAL_COMMUNITY): Payer: Self-pay

## 2023-02-12 ENCOUNTER — Other Ambulatory Visit (HOSPITAL_COMMUNITY): Payer: Self-pay

## 2023-02-12 ENCOUNTER — Other Ambulatory Visit: Payer: Self-pay

## 2023-02-12 ENCOUNTER — Other Ambulatory Visit: Payer: Self-pay | Admitting: Medical-Surgical

## 2023-02-12 DIAGNOSIS — E785 Hyperlipidemia, unspecified: Secondary | ICD-10-CM

## 2023-02-12 DIAGNOSIS — Z3041 Encounter for surveillance of contraceptive pills: Secondary | ICD-10-CM

## 2023-02-12 MED ORDER — LEVONORGESTREL-ETHINYL ESTRAD 0.1-20 MG-MCG PO TABS
1.0000 | ORAL_TABLET | Freq: Every day | ORAL | 1 refills | Status: DC
Start: 2023-02-12 — End: 2023-08-22
  Filled 2023-02-12: qty 84, 84d supply, fill #0
  Filled 2023-06-05: qty 84, 84d supply, fill #1

## 2023-02-12 MED ORDER — ATORVASTATIN CALCIUM 80 MG PO TABS
80.0000 mg | ORAL_TABLET | Freq: Every day | ORAL | 1 refills | Status: DC
Start: 2023-02-12 — End: 2023-08-12
  Filled 2023-02-12: qty 90, 90d supply, fill #0
  Filled 2023-05-13: qty 90, 90d supply, fill #1

## 2023-02-21 ENCOUNTER — Encounter: Payer: Self-pay | Admitting: Medical-Surgical

## 2023-02-21 ENCOUNTER — Ambulatory Visit (INDEPENDENT_AMBULATORY_CARE_PROVIDER_SITE_OTHER): Payer: Commercial Managed Care - PPO

## 2023-02-21 ENCOUNTER — Ambulatory Visit (INDEPENDENT_AMBULATORY_CARE_PROVIDER_SITE_OTHER): Payer: Commercial Managed Care - PPO | Admitting: Medical-Surgical

## 2023-02-21 VITALS — BP 122/77 | HR 77 | Ht <= 58 in | Wt 135.1 lb

## 2023-02-21 DIAGNOSIS — Z1211 Encounter for screening for malignant neoplasm of colon: Secondary | ICD-10-CM | POA: Diagnosis not present

## 2023-02-21 DIAGNOSIS — M79672 Pain in left foot: Secondary | ICD-10-CM

## 2023-02-21 DIAGNOSIS — R7303 Prediabetes: Secondary | ICD-10-CM | POA: Diagnosis not present

## 2023-02-21 DIAGNOSIS — B009 Herpesviral infection, unspecified: Secondary | ICD-10-CM

## 2023-02-21 DIAGNOSIS — I10 Essential (primary) hypertension: Secondary | ICD-10-CM | POA: Diagnosis not present

## 2023-02-21 DIAGNOSIS — M2012 Hallux valgus (acquired), left foot: Secondary | ICD-10-CM | POA: Diagnosis not present

## 2023-02-21 DIAGNOSIS — E785 Hyperlipidemia, unspecified: Secondary | ICD-10-CM

## 2023-02-21 DIAGNOSIS — E034 Atrophy of thyroid (acquired): Secondary | ICD-10-CM | POA: Diagnosis not present

## 2023-02-21 DIAGNOSIS — Z0000 Encounter for general adult medical examination without abnormal findings: Secondary | ICD-10-CM | POA: Diagnosis not present

## 2023-02-21 DIAGNOSIS — M19072 Primary osteoarthritis, left ankle and foot: Secondary | ICD-10-CM | POA: Diagnosis not present

## 2023-02-21 NOTE — Progress Notes (Signed)
Complete physical exam  Patient: Jillian Lee   DOB: June 13, 1976   47 y.o. Female  MRN: [id number]  Subjective:    Chief Complaint  Patient presents with   Annual Exam    Jillian Lee is a 47 y.o. female who presents today for a complete physical exam. She reports consuming a general diet. The patient does not participate in regular exercise at present. She generally feels well. She reports sleeping well. She does have additional problems to discuss today.    Most recent fall risk assessment:    02/21/2023    8:41 AM  Fall Risk   Falls in the past year? 0  Number falls in past yr: 0  Injury with Fall? 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   Ascorbic Acid (VITAMIN C) 1000 MG tablet Take 1,000 mg by mouth daily with lunch. W/Rose Hips   aspirin 81 MG chewable tablet Chew 1 tablet (81 mg total) by mouth daily.   atorvastatin (LIPITOR) 80 MG tablet Take 1 tablet (80 mg total) by mouth daily.   CO-ENZYME Q10 PO Take 400 mg by mouth 1 day or 1 dose.   [employer] MAXIMUM REDNESS RELIEF 0.03-0.5 % SOLN Place 1 drop into both eyes 3 (three) times daily.   ferrous sulfate 325 (65 FE) MG tablet Take 1 tablet (325 mg total) by mouth daily.   ipratropium (ATROVENT) 0.06 % nasal spray Place 2 sprays into the nose 4 (four) times daily as needed for sinus congestion.   levonorgestrel-ethinyl estradiol (VIENVA) 0.1-20 MG-MCG tablet Take 1 tablet by mouth daily.   levothyroxine  (SYNTHROID) 50 MCG tablet Take 1 tablet (50 mcg total) by mouth daily before breakfast.   lisinopril-hydrochlorothiazide (ZESTORETIC) 20-25 MG tablet Take 1 tablet by mouth daily.   Multiple Vitamins-Minerals (MULTIVITAMIN WITH MINERALS) tablet Take 1 tablet by mouth daily with lunch.    nitroGLYCERIN (NITROSTAT) 0.3 MG SL tablet Place 1 tablet (0.3 mg total) under the tongue every 5 (five) minutes as needed for chest pain.   pantoprazole (PROTONIX) 40 MG tablet Take 1 tablet (40 mg total) by mouth 2 (two) times daily before a meal.   Turmeric 450 MG CAPS Take 450 mg by mouth 1 day or 1 dose.   valACYclovir (VALTREX) 500 MG tablet Take 1 tablet (500 mg total) by mouth daily.   [DISCONTINUED] cephALEXin (KEFLEX) 500 MG capsule Take 1 capsule (500 mg total) by mouth 3 (three) times daily with meals for 3 days (Patient not taking: Reported on 02/21/2023)   No facility-administered medications prior to visit.    Review of Systems  Constitutional:  Negative for chills, fever, malaise/fatigue and weight loss.  HENT:  Negative for congestion, ear pain, hearing loss, sinus pain and sore throat.   Eyes:  Negative for blurred vision, photophobia and pain.  Respiratory:  Positive for cough (  since yesterday). Negative for shortness of breath and wheezing.   Cardiovascular:  Negative for chest pain, palpitations and leg swelling.  Gastrointestinal:  Negative for abdominal pain, constipation, diarrhea, heartburn, nausea and vomiting.  Genitourinary:  Negative for dysuria, frequency and urgency.  Musculoskeletal:  Negative for falls and neck pain.       Left foot pain  Skin:  Negative for itching and rash.  Neurological:  Negative for dizziness, weakness and headaches.  Endo/Heme/Allergies:  Negative for polydipsia. Does not bruise/bleed easily.  Psychiatric/Behavioral:  Negative for depression, substance abuse and suicidal ideas. The patient is not nervous/anxious.      Objective:    BP 122/77   Pulse  77   Ht 4\' 9"  (1.448 m)   Wt 135 lb 1.9 oz (61.3 kg)   SpO2 98%   BMI 29.24 kg/m    Physical Exam Vitals reviewed.  Constitutional:      General: She is not in acute distress.    Appearance: Normal appearance. She is not ill-appearing.  HENT:     Head: Normocephalic and atraumatic.     Right Ear: Tympanic membrane, ear canal and external ear normal. There is no impacted cerumen.     Left Ear: Tympanic membrane, ear canal and external ear normal. There is no impacted cerumen.     Nose: Nose normal. No congestion or rhinorrhea.     Mouth/Throat:     Mouth: Mucous membranes are moist.     Pharynx: No oropharyngeal exudate or posterior oropharyngeal erythema.  Eyes:     General: No scleral icterus.       Right eye: No discharge.        Left eye: No discharge.     Extraocular Movements: Extraocular movements intact.     Conjunctiva/sclera: Conjunctivae normal.     Pupils: Pupils are equal, round, and reactive to light.  Neck:     Thyroid: No thyromegaly.     Vascular: No carotid bruit or JVD.     Trachea: Trachea normal.  Cardiovascular:     Rate and Rhythm: Normal rate and regular rhythm.     Pulses: Normal pulses.     Heart sounds: Normal heart sounds. No murmur heard.    No friction rub. No gallop.  Pulmonary:     Effort: Pulmonary effort is normal. No respiratory distress.     Breath sounds: Normal breath sounds. No wheezing.  Abdominal:     General: Bowel sounds are normal. There is no distension.     Palpations: Abdomen is soft.     Tenderness: There is no abdominal tenderness. There is no guarding.  Musculoskeletal:        General: Normal range of motion.     Cervical back: Normal range of motion and neck supple.  Lymphadenopathy:     Cervical: No cervical adenopathy.  Skin:    General: Skin is warm and dry.  Neurological:     Mental Status: She is alert and oriented to person, place, and time.     Cranial Nerves: No cranial nerve deficit.  Psychiatric:         Mood and Affect: Mood normal.        Behavior: Behavior normal.        Thought Content: Thought content normal.        Judgment: Judgment normal.   No results found for any visits on 02/21/23.     Assessment & Plan:    Routine Health Maintenance and Physical Exam  Immunization History  Administered Date(s) Administered   Influenza-Unspecified 04/09/2018, 04/09/2021   PFIZER(Purple Top)SARS-COV-2 Vaccination 02/03/2020, 02/24/2020   Tdap 12/08/2012, 12/09/2022    Health Maintenance  Topic Date Due   Colonoscopy  Never done   COVID-19 Vaccine (3 - 2023-24 season) 03/10/2022   PAP SMEAR-Modifier  04/25/2022   INFLUENZA VACCINE  07/10/2023 (Originally 02/08/2023)   DTaP/Tdap/Td (3 - Td or Tdap) 12/08/2032   HPV VACCINES  Aged Out   Hepatitis C Screening  Discontinued   HIV Screening  Discontinued    Discussed health benefits of physical activity, and encouraged her to engage in regular exercise appropriate for her age and condition.  1. Annual physical exam Checking labs as below.  Up-to-date on preventative care.  Wellness information provided with AVS. - Lipid panel - CBC with Differential/Platelet - CMP14+EGFR  2. Prediabetes Checking hemoglobin A1c. - Hemoglobin A1c  3. Hypothyroidism due to acquired atrophy of thyroid Checking TSH today.  Continue levothyroxine as prescribed, titrate depending on results. - TSH  4. Hyperlipidemia, unspecified hyperlipidemia type Taking lipid panel today.  Continue atorvastatin. - Lipid panel  5. Essential hypertension, benign Checking labs as below.  Blood pressure at goal today.  Continue lisinopril-HCTZ 20-25 mg daily. - Lipid panel - CBC with Differential/Platelet - CMP14+EGFR  6. Colon cancer screening Referring to GI for colonoscopy. 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  8. Left foot pain 6 to 7 months of left foot pain along the medial aspect of the arch.   Tenderness to palpation along the area.  Getting x-rays today.  Discussed conservative measures including supportive shoes, orthotics, heat, ice, ibuprofen/Tylenol, and topical analgesics.  Return in about 6 months (around 08/24/2023) for chronic disease follow up.   Christen Butter, NP

## 2023-02-22 LAB — HEMOGLOBIN A1C
Est. average glucose Bld gHb Est-mCnc: 134 mg/dL
Hgb A1c MFr Bld: 6.3 % — ABNORMAL HIGH (ref 4.8–5.6)

## 2023-02-22 LAB — TSH: TSH: 1.77 u[IU]/mL (ref 0.450–4.500)

## 2023-02-22 LAB — LIPID PANEL
Chol/HDL Ratio: 4 ratio (ref 0.0–4.4)
Cholesterol, Total: 200 mg/dL — ABNORMAL HIGH (ref 100–199)
HDL: 50 mg/dL (ref >39)
LDL Chol Calc (NIH): 123 mg/dL — ABNORMAL HIGH (ref 0–99)
Triglycerides: 154 mg/dL — ABNORMAL HIGH (ref 0–149)
VLDL Cholesterol Cal: 27 mg/dL (ref 5–40)

## 2023-02-22 LAB — CBC WITH DIFFERENTIAL/PLATELET
Basophils Absolute: 0.1 10*3/uL (ref 0.0–0.2)
Basos: 1 %
EOS (ABSOLUTE): 0.1 10*3/uL (ref 0.0–0.4)
Eos: 1 %
Hematocrit: 42.6 % (ref 34.0–46.6)
Hemoglobin: 14.2 g/dL (ref 11.1–15.9)
Immature Grans (Abs): 0 10*3/uL (ref 0.0–0.1)
Immature Granulocytes: 0 %
Lymphocytes Absolute: 2.5 10*3/uL (ref 0.7–3.1)
Lymphs: 29 %
MCH: 31.8 pg (ref 26.6–33.0)
MCHC: 33.3 g/dL (ref 31.5–35.7)
MCV: 95 fL (ref 79–97)
Monocytes Absolute: 0.6 10*3/uL (ref 0.1–0.9)
Monocytes: 7 %
Neutrophils Absolute: 5.5 10*3/uL (ref 1.4–7.0)
Neutrophils: 62 %
Platelets: 275 10*3/uL (ref 150–450)
RBC: 4.47 x10E6/uL (ref 3.77–5.28)
RDW: 12.1 % (ref 11.7–15.4)
WBC: 8.9 10*3/uL (ref 3.4–10.8)

## 2023-02-22 LAB — CMP14+EGFR
ALT: 36 IU/L — ABNORMAL HIGH (ref 0–32)
AST: 27 IU/L (ref 0–40)
Albumin: 4.5 g/dL (ref 3.9–4.9)
Alkaline Phosphatase: 82 IU/L (ref 44–121)
BUN/Creatinine Ratio: 17 (ref 9–23)
BUN: 14 mg/dL (ref 6–24)
Bilirubin Total: 0.6 mg/dL (ref 0.0–1.2)
CO2: 24 mmol/L (ref 20–29)
Calcium: 10 mg/dL (ref 8.7–10.2)
Chloride: 98 mmol/L (ref 96–106)
Creatinine, Ser: 0.81 mg/dL (ref 0.57–1.00)
Globulin, Total: 2.2 g/dL (ref 1.5–4.5)
Glucose: 89 mg/dL (ref 70–99)
Potassium: 4 mmol/L (ref 3.5–5.2)
Sodium: 139 mmol/L (ref 134–144)
Total Protein: 6.7 g/dL (ref 6.0–8.5)
eGFR: 89 mL/min/{1.73_m2} (ref >59)

## 2023-03-02 IMAGING — DX DG FEMUR 2+V*R*
4 series · 4 of 4 positions shown · non-contrast
Comparison: None.

CLINICAL DATA: 44-year-old female status post fall 10 days ago with
persistent posterior right leg pain.

EXAM:
RIGHT FEMUR 2 VIEWS

[femur ap (1 of 2)]
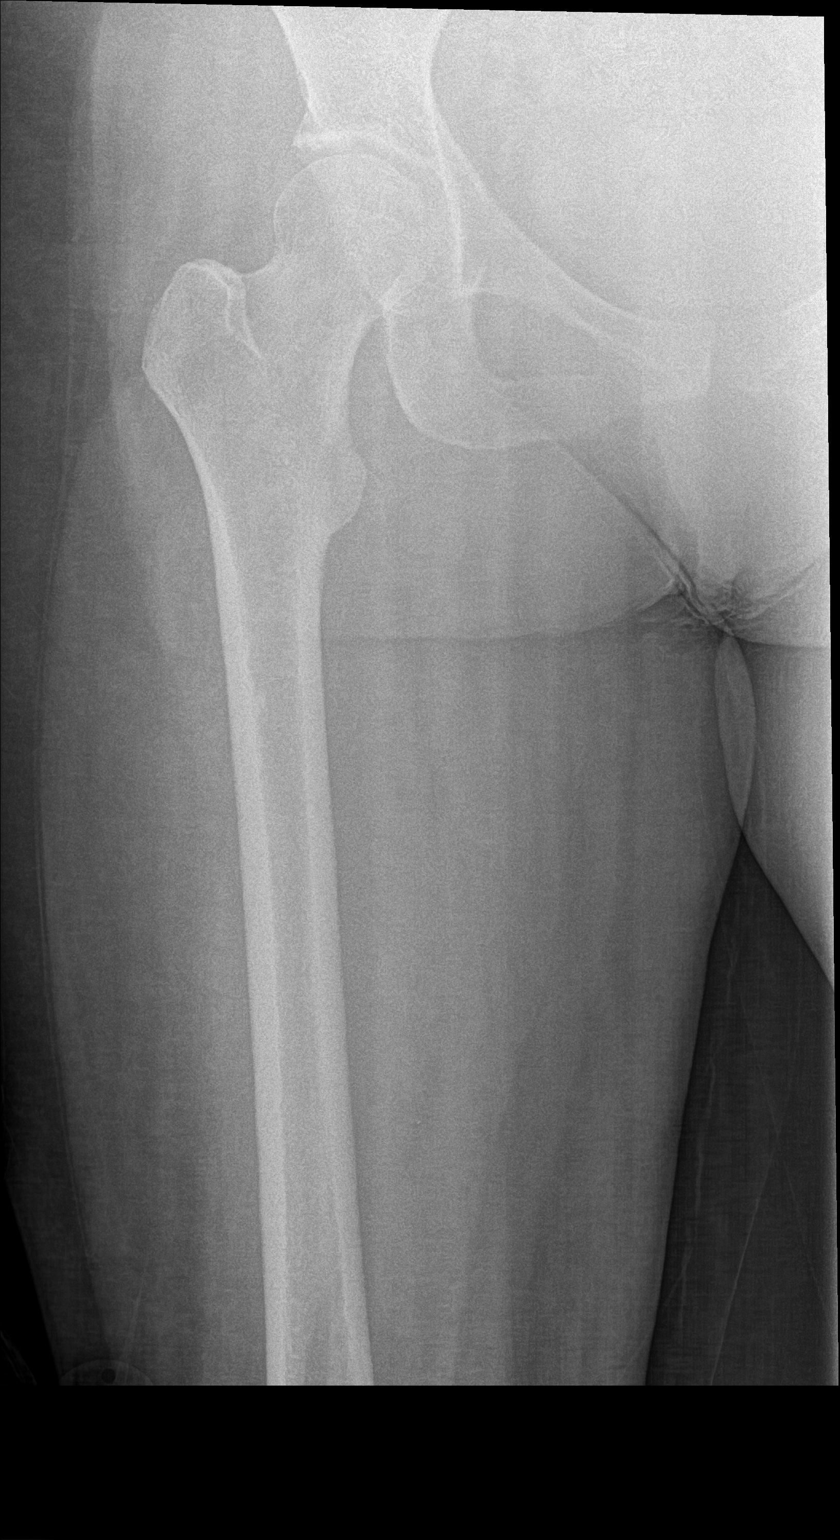

[femur ap (2 of 2)]
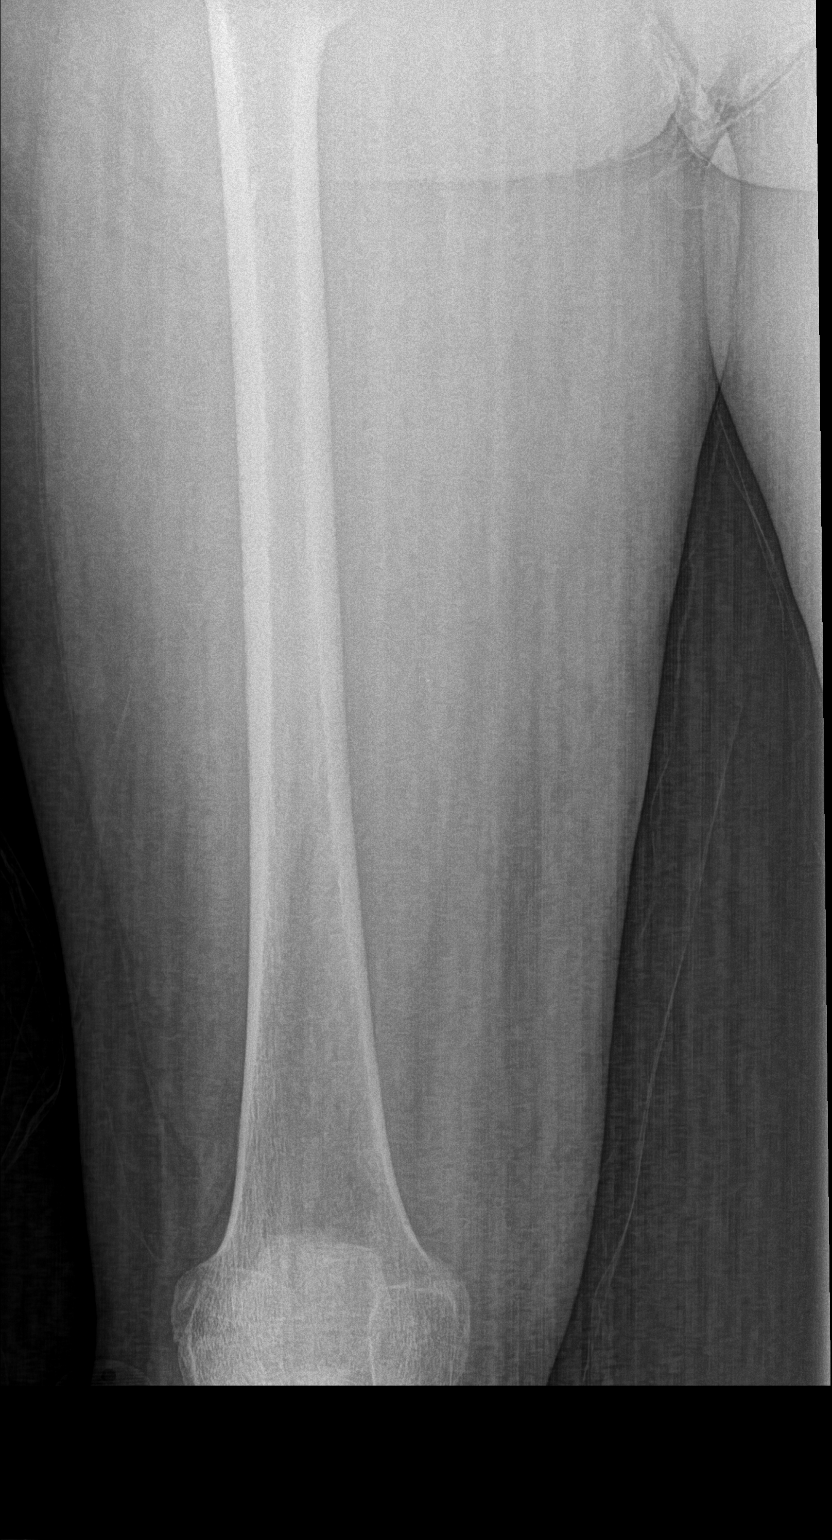

[femur lat (1 of 2)]
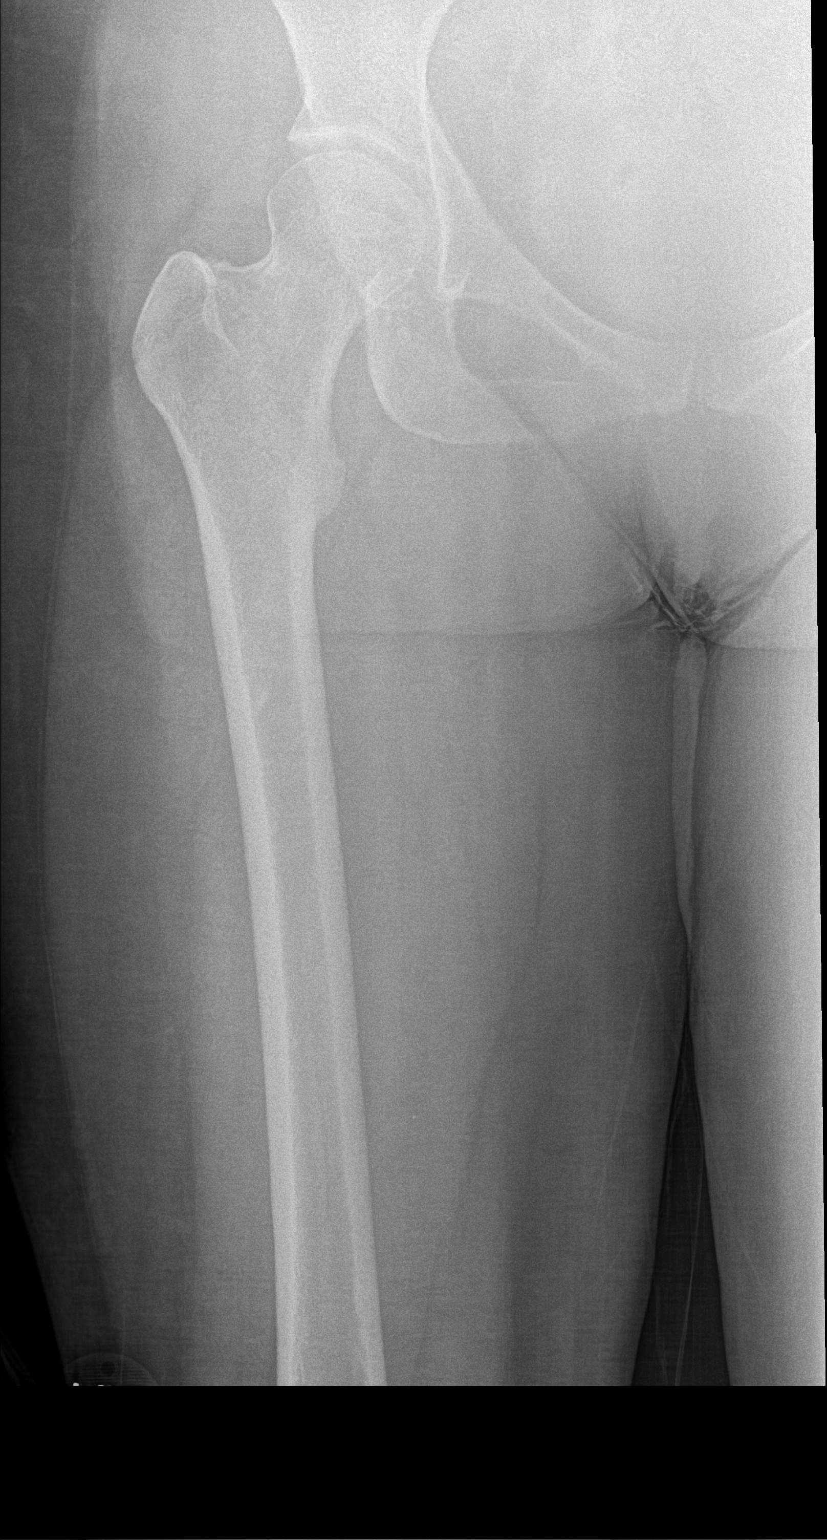

[femur lat (2 of 2)]
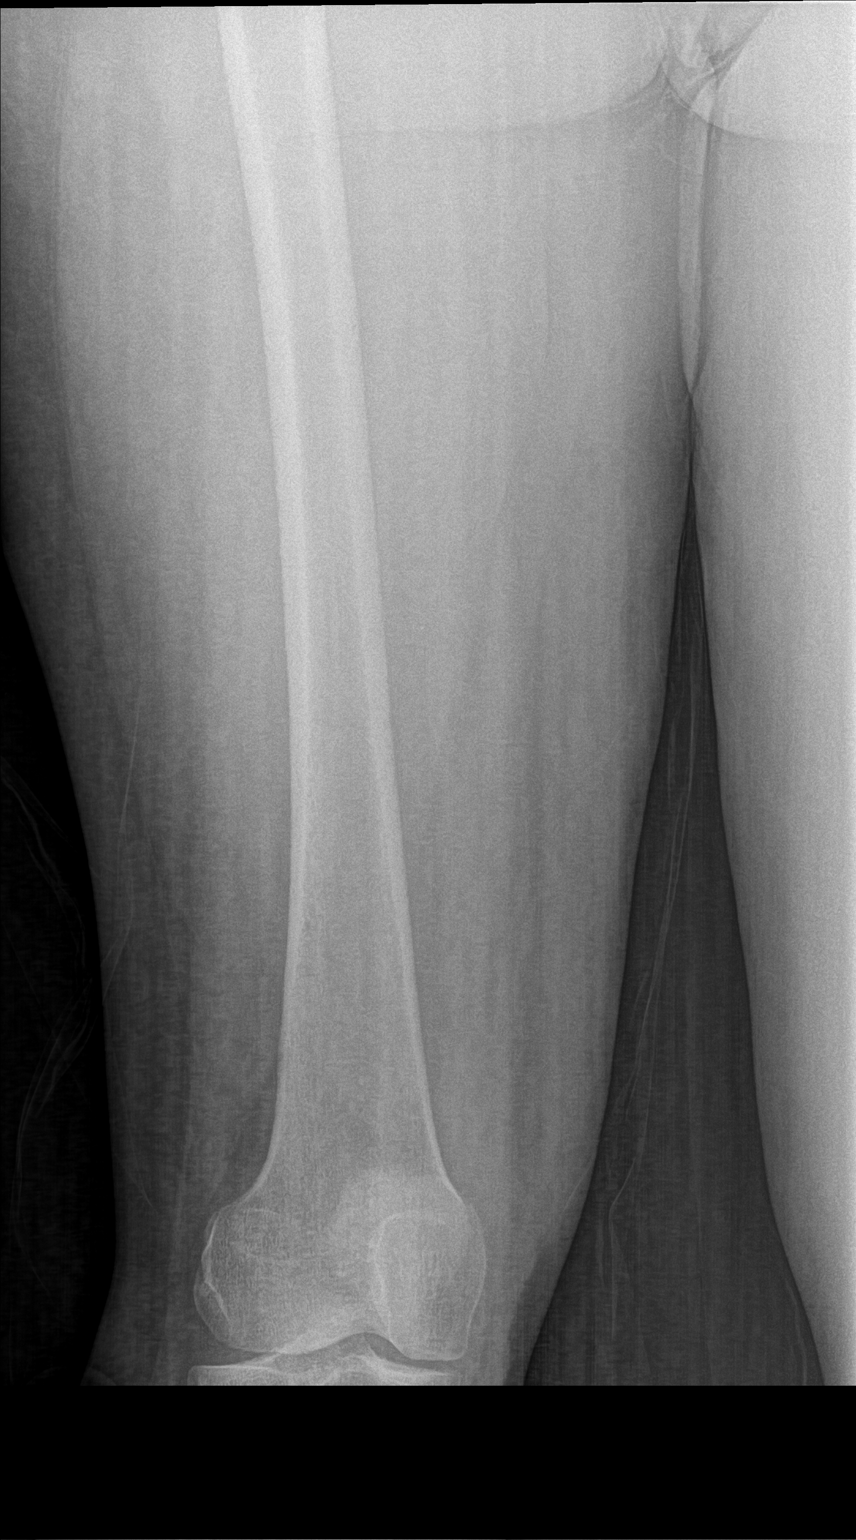

[4 of 4 positions shown; findings below may reference images not displayed]

FINDINGS: Bone mineralization is within normal limits. Visible right
hemipelvis appears intact. There is no evidence of fracture or other
focal bone lesions. Soft tissue contours appear negative.
IMPRESSION: Negative.

## 2023-04-02 ENCOUNTER — Other Ambulatory Visit: Payer: Self-pay | Admitting: Medical-Surgical

## 2023-04-02 DIAGNOSIS — B009 Herpesviral infection, unspecified: Secondary | ICD-10-CM

## 2023-04-05 ENCOUNTER — Other Ambulatory Visit (HOSPITAL_COMMUNITY): Payer: Self-pay

## 2023-04-05 MED ORDER — VALACYCLOVIR HCL 500 MG PO TABS
500.0000 mg | ORAL_TABLET | Freq: Every day | ORAL | 0 refills | Status: DC
Start: 2023-04-05 — End: 2023-08-12
  Filled 2023-04-05: qty 90, 90d supply, fill #0

## 2023-04-16 DIAGNOSIS — Z1211 Encounter for screening for malignant neoplasm of colon: Secondary | ICD-10-CM | POA: Diagnosis not present

## 2023-04-21 LAB — COLOGUARD: COLOGUARD: NEGATIVE

## 2023-05-02 ENCOUNTER — Other Ambulatory Visit (HOSPITAL_COMMUNITY): Payer: Self-pay

## 2023-05-02 ENCOUNTER — Other Ambulatory Visit: Payer: Self-pay | Admitting: Medical-Surgical

## 2023-05-02 DIAGNOSIS — E034 Atrophy of thyroid (acquired): Secondary | ICD-10-CM

## 2023-05-02 MED ORDER — LEVOTHYROXINE SODIUM 50 MCG PO TABS
50.0000 ug | ORAL_TABLET | Freq: Every day | ORAL | 1 refills | Status: DC
Start: 2023-05-02 — End: 2023-08-22
  Filled 2023-05-02: qty 90, 90d supply, fill #0
  Filled 2023-07-31: qty 90, 90d supply, fill #1

## 2023-05-30 ENCOUNTER — Other Ambulatory Visit: Payer: Self-pay | Admitting: Family Medicine

## 2023-05-30 ENCOUNTER — Ambulatory Visit (INDEPENDENT_AMBULATORY_CARE_PROVIDER_SITE_OTHER): Payer: Commercial Managed Care - PPO

## 2023-05-30 ENCOUNTER — Ambulatory Visit: Payer: Commercial Managed Care - PPO | Admitting: Family Medicine

## 2023-05-30 ENCOUNTER — Other Ambulatory Visit (HOSPITAL_COMMUNITY): Payer: Self-pay

## 2023-05-30 VITALS — BP 121/61 | HR 78 | Ht <= 58 in | Wt 136.0 lb

## 2023-05-30 DIAGNOSIS — R319 Hematuria, unspecified: Secondary | ICD-10-CM | POA: Diagnosis not present

## 2023-05-30 DIAGNOSIS — N200 Calculus of kidney: Secondary | ICD-10-CM

## 2023-05-30 DIAGNOSIS — R109 Unspecified abdominal pain: Secondary | ICD-10-CM | POA: Diagnosis not present

## 2023-05-30 DIAGNOSIS — K5900 Constipation, unspecified: Secondary | ICD-10-CM | POA: Diagnosis not present

## 2023-05-30 LAB — POCT URINALYSIS DIP (CLINITEK)
Bilirubin, UA: NEGATIVE
Glucose, UA: NEGATIVE mg/dL
Ketones, POC UA: NEGATIVE mg/dL
Leukocytes, UA: NEGATIVE
Nitrite, UA: NEGATIVE
POC PROTEIN,UA: NEGATIVE
Spec Grav, UA: 1.015 (ref 1.010–1.025)
Urobilinogen, UA: 0.2 U/dL
pH, UA: 7 (ref 5.0–8.0)

## 2023-05-30 MED ORDER — IBUPROFEN 800 MG PO TABS
800.0000 mg | ORAL_TABLET | Freq: Three times a day (TID) | ORAL | 0 refills | Status: DC | PRN
Start: 2023-05-30 — End: 2023-08-16
  Filled 2023-05-30: qty 30, 10d supply, fill #0

## 2023-05-30 NOTE — Progress Notes (Signed)
   Acute Office Visit  Subjective:     Patient ID: Jillian Lee, female    DOB: [birth year], 47 y.o.   MRN: [id number]  Chief Complaint  Patient presents with   Urinary Tract Infection    X1wk frequent urination,     HPI Patient is in today for concerns for kidney stones. She has been having urinary frequency and flank pain for a week. She does have a hx of kidney stones and says this feels similar. Pt denies fever.  Review of Systems  Constitutional:  Negative for chills and fever.  Respiratory:  Negative for cough and shortness of breath.   Cardiovascular:  Negative for chest pain.  Genitourinary:  Positive for flank pain and frequency.  Neurological:  Negative for headaches.        Objective:    BP 121/61 (BP Location: Left Arm, Patient Position: Sitting, Cuff Size: Normal)   Pulse 78   Ht 4\' 9"  (1.448 m)   Wt 136 lb (61.7 kg)   SpO2 99%   BMI 29.43 kg/m    Physical Exam Vitals and nursing note reviewed.  Constitutional:      General: She is not in acute distress.    Appearance: Normal appearance.  HENT:     Head: Normocephalic and atraumatic.     Right Ear: External ear normal.     Left Ear: External ear normal.     Nose: Nose normal.  Eyes:     Conjunctiva/sclera: Conjunctivae normal.  Cardiovascular:     Rate and Rhythm: Normal rate and regular rhythm.  Pulmonary:     Effort: Pulmonary effort is normal.     Breath sounds: Normal breath sounds.  Abdominal:     Tenderness: There is right CVA tenderness and left CVA tenderness.  Neurological:     General: No focal deficit present.     Mental Status: She is alert and oriented to person, place, and time.  Psychiatric:        Mood and Affect: Mood normal.        Behavior: Behavior normal.        Thought Content: Thought content normal.        Judgment: Judgment normal.     No results found for any visits on 05/30/23.      Assessment & Plan:   Problem List Items Addressed This Visit        Other   Flank pain - Primary    - POC UA negative for nitrites/leukocytes. Some trace blood seen which could indicate nephrolithiasis - positive CVA tenderness bilaterally, we will get KUB and if negative will order CT scan  - given ibuprofen for pain control and recommended alternating with tylenol  - gave RTC precautions if she starts running fevers      Relevant Medications   ibuprofen (ADVIL) 800 MG tablet   Other Relevant Orders   POCT URINALYSIS DIP (CLINITEK)   DG Abd 1 View    Meds ordered this encounter  Medications   ibuprofen (ADVIL) 800 MG tablet    Sig: Take 1 tablet (800 mg total) by mouth every 8 (eight) hours as needed for moderate pain (pain score 4-6).    Dispense:  30 tablet    Refill:  0    No follow-ups on file.  Charlton Amor, DO

## 2023-05-30 NOTE — Assessment & Plan Note (Addendum)
-   POC UA negative for nitrites/leukocytes. Some trace blood seen which could indicate nephrolithiasis - positive CVA tenderness bilaterally, we will get KUB and if negative will order CT scan  - given ibuprofen for pain control and recommended alternating with tylenol  - gave RTC precautions if she starts running fevers

## 2023-07-23 ENCOUNTER — Encounter: Payer: Self-pay | Admitting: Family Medicine

## 2023-07-23 ENCOUNTER — Other Ambulatory Visit: Payer: Self-pay

## 2023-07-23 ENCOUNTER — Ambulatory Visit: Payer: Commercial Managed Care - PPO | Admitting: Family Medicine

## 2023-07-23 ENCOUNTER — Other Ambulatory Visit (HOSPITAL_COMMUNITY): Payer: Self-pay

## 2023-07-23 VITALS — BP 141/82 | HR 99 | Ht <= 58 in | Wt 136.2 lb

## 2023-07-23 DIAGNOSIS — H109 Unspecified conjunctivitis: Secondary | ICD-10-CM | POA: Diagnosis not present

## 2023-07-23 DIAGNOSIS — J40 Bronchitis, not specified as acute or chronic: Secondary | ICD-10-CM | POA: Diagnosis not present

## 2023-07-23 DIAGNOSIS — J329 Chronic sinusitis, unspecified: Secondary | ICD-10-CM | POA: Insufficient documentation

## 2023-07-23 DIAGNOSIS — R051 Acute cough: Secondary | ICD-10-CM | POA: Diagnosis not present

## 2023-07-23 MED ORDER — POLYMYXIN B-TRIMETHOPRIM 10000-0.1 UNIT/ML-% OP SOLN
1.0000 [drp] | OPHTHALMIC | 0 refills | Status: DC
  Filled 2023-07-23: qty 10, 10d supply, fill #0

## 2023-07-23 MED ORDER — METHYLPREDNISOLONE 4 MG PO TBPK
ORAL_TABLET | ORAL | 0 refills | Status: DC
Start: 2023-07-23 — End: 2024-02-20
  Filled 2023-07-23: qty 21, 6d supply, fill #0

## 2023-07-23 MED ORDER — DOXYCYCLINE HYCLATE 100 MG PO TABS
100.0000 mg | ORAL_TABLET | Freq: Two times a day (BID) | ORAL | 0 refills | Status: AC
Start: 2023-07-23 — End: 2023-07-31
  Filled 2023-07-23: qty 14, 7d supply, fill #0

## 2023-07-23 MED ORDER — GUAIFENESIN-CODEINE 100-10 MG/5ML PO SOLN
5.0000 mL | Freq: Three times a day (TID) | ORAL | 0 refills | Status: DC | PRN
Start: 2023-07-23 — End: 2024-02-20
  Filled 2023-07-23: qty 240, 16d supply, fill #0

## 2023-07-23 NOTE — Assessment & Plan Note (Signed)
 Pt presents with cough and congestion coupled with sinus pain. Likely sinobronchitis. Will send in doxy and medrol dose pack to help with congestion and inflammation.

## 2023-07-23 NOTE — Progress Notes (Signed)
 Established patient visit   Patient: Jillian Lee   DOB: 28-Jan-1976   48 y.o. Female  MRN: [id number] Visit Date: 07/23/2023  Today's healthcare provider: Bernice GORMAN Juneau, DO   Chief Complaint  Patient presents with   Nasal Congestion    Pt has had a cough and stuffy head since last thrus.    SUBJECTIVE    Chief Complaint  Patient presents with   Nasal Congestion    Pt has had a cough and stuffy head since last thrus.   HPI HPI     Nasal Congestion    Additional comments: Pt has had a cough and stuffy head since last thrus.      Last edited by Duwaine Riggs, CMA on 07/23/2023 11:41 AM.      Pt presents with coughing and congestion for four days. Says symptoms have gotten progressively worse to include laryngitis and some conjunctivitis in her left eye. Admits to deep cough.   Review of Systems  Constitutional:  Negative for activity change, fatigue and fever.  HENT:  Positive for congestion and sinus pain.   Respiratory:  Positive for cough. Negative for shortness of breath.   Cardiovascular:  Negative for chest pain.  Gastrointestinal:  Negative for abdominal pain.  Genitourinary:  Negative for difficulty urinating.       Current Meds  Medication Sig   Ascorbic Acid (VITAMIN C) 1000 MG tablet Take 1,000 mg by mouth daily with lunch. W/Rose Hips   aspirin  81 MG chewable tablet Chew 1 tablet (81 mg total) by mouth daily.   atorvastatin  (LIPITOR) 80 MG tablet Take 1 tablet (80 mg total) by mouth daily.   CO-ENZYME Q10 PO Take 400 mg by mouth 1 day or 1 dose.   [employer] MAXIMUM REDNESS RELIEF 0.03-0.5 % SOLN Place 1 drop into both eyes 3 (three) times daily.   doxycycline  (VIBRA -TABS) 100 MG tablet Take 1 tablet (100 mg total) by mouth 2 (two) times daily for 7 days.   ferrous sulfate  325 (65 FE) MG tablet Take 1 tablet (325 mg total) by mouth daily.   guaiFENesin -codeine  100-10 MG/5ML syrup Take 5 mLs by mouth 3 (three) times daily as needed for cough.    ibuprofen  (ADVIL ) 800 MG tablet Take 1 tablet (800 mg total) by mouth every 8 (eight) hours as needed for moderate pain (pain score 4-6).   ipratropium (ATROVENT ) 0.06 % nasal spray Place 2 sprays into the nose 4 (four) times daily as needed for sinus congestion.   levonorgestrel -ethinyl estradiol  (VIENVA ) 0.1-20 MG-MCG tablet Take 1 tablet by mouth daily.   levothyroxine  (SYNTHROID ) 50 MCG tablet Take 1 tablet (50 mcg total) by mouth daily before breakfast.   lisinopril -hydrochlorothiazide  (ZESTORETIC ) 20-25 MG tablet Take 1 tablet by mouth daily.   methylPREDNISolone  (MEDROL  DOSEPAK) 4 MG TBPK tablet Follow instructions on pill pack   Multiple Vitamins-Minerals (MULTIVITAMIN WITH MINERALS) tablet Take 1 tablet by mouth daily with lunch.    nitroGLYCERIN  (NITROSTAT ) 0.3 MG SL tablet Place 1 tablet (0.3 mg total) under the tongue every 5 (five) minutes as needed for chest pain.   pantoprazole  (PROTONIX ) 40 MG tablet Take 1 tablet (40 mg total) by mouth 2 (two) times daily before a meal.   trimethoprim -polymyxin b  (POLYTRIM ) ophthalmic solution Place 1 drop into the left eye every 4 (four) hours.   Turmeric 450 MG CAPS Take 450 mg by mouth 1 day or 1 dose.   valACYclovir  (VALTREX ) 500 MG tablet Take 1 tablet (500  mg total) by mouth daily.    OBJECTIVE    BP (!) 141/82 (BP Location: Left Arm, Patient Position: Sitting, Cuff Size: Normal)   Pulse 99   Ht 4' 9 (1.448 m)   Wt 136 lb 4 oz (61.8 kg)   SpO2 97%   BMI 29.48 kg/m   Physical Exam Vitals and nursing note reviewed.  Constitutional:      General: She is not in acute distress.    Appearance: Normal appearance.  HENT:     Head: Normocephalic and atraumatic.     Right Ear: Tympanic membrane, ear canal and external ear normal.     Left Ear: Tympanic membrane, ear canal and external ear normal.     Nose: Congestion present.  Eyes:     Conjunctiva/sclera: Conjunctivae normal.  Cardiovascular:     Rate and Rhythm: Normal rate and  regular rhythm.  Pulmonary:     Effort: Pulmonary effort is normal.     Breath sounds: Normal breath sounds.  Neurological:     General: No focal deficit present.     Mental Status: She is alert and oriented to person, place, and time.  Psychiatric:        Mood and Affect: Mood normal.        Behavior: Behavior normal.        Thought Content: Thought content normal.        Judgment: Judgment normal.        ASSESSMENT & PLAN    Problem List Items Addressed This Visit       Respiratory   Sinobronchitis - Primary   Pt presents with cough and congestion coupled with sinus pain. Likely sinobronchitis. Will send in doxy and medrol  dose pack to help with congestion and inflammation.       Relevant Medications   doxycycline  (VIBRA -TABS) 100 MG tablet   methylPREDNISolone  (MEDROL  DOSEPAK) 4 MG TBPK tablet   guaiFENesin -codeine  100-10 MG/5ML syrup     Other   Bacterial conjunctivitis   Pt reports conjunctival injection that started this morning, have sent in polytrim . With this along with sinobronchitis symptoms there is a possibility this is adenovirus and requires time and supportive care.       Acute cough   Given tussionex. Pmp reviewed and verified with no red flags.       Return if symptoms worsen or fail to improve.      Meds ordered this encounter  Medications   doxycycline  (VIBRA -TABS) 100 MG tablet    Sig: Take 1 tablet (100 mg total) by mouth 2 (two) times daily for 7 days.    Dispense:  14 tablet    Refill:  0   methylPREDNISolone  (MEDROL  DOSEPAK) 4 MG TBPK tablet    Sig: Follow instructions on pill pack    Dispense:  21 tablet    Refill:  0   guaiFENesin -codeine  100-10 MG/5ML syrup    Sig: Take 5 mLs by mouth 3 (three) times daily as needed for cough.    Dispense:  240 mL    Refill:  0   trimethoprim -polymyxin b  (POLYTRIM ) ophthalmic solution    Sig: Place 1 drop into the left eye every 4 (four) hours.    Dispense:  10 mL    Refill:  0    No orders  of the defined types were placed in this encounter.    Bernice GORMAN Juneau, DO  [facility] at [facility] [phone] (phone) [phone] (fax)  [facility]

## 2023-07-23 NOTE — Assessment & Plan Note (Signed)
 Pt reports conjunctival injection that started this morning, have sent in polytrim. With this along with sinobronchitis symptoms there is a possibility this is adenovirus and requires time and supportive care.

## 2023-07-23 NOTE — Assessment & Plan Note (Signed)
 Given tussionex. Pmp reviewed and verified with no red flags.

## 2023-07-24 ENCOUNTER — Other Ambulatory Visit (HOSPITAL_COMMUNITY): Payer: Self-pay

## 2023-07-25 ENCOUNTER — Ambulatory Visit: Payer: Commercial Managed Care - PPO | Admitting: Urology

## 2023-08-03 ENCOUNTER — Encounter: Payer: Self-pay | Admitting: Medical-Surgical

## 2023-08-03 ENCOUNTER — Telehealth (INDEPENDENT_AMBULATORY_CARE_PROVIDER_SITE_OTHER): Payer: Commercial Managed Care - PPO | Admitting: Medical-Surgical

## 2023-08-03 DIAGNOSIS — H6993 Unspecified Eustachian tube disorder, bilateral: Secondary | ICD-10-CM

## 2023-08-03 DIAGNOSIS — Z0289 Encounter for other administrative examinations: Secondary | ICD-10-CM

## 2023-08-03 NOTE — Progress Notes (Signed)
Virtual Visit via Video Note  I connected with Jillian Lee on 08/03/23 at  8:10 AM EST by a video enabled telemedicine application and verified that I am speaking with the correct person using two identifiers.   I discussed the limitations of evaluation and management by telemedicine and the availability of in person appointments. The patient expressed understanding and agreed to proceed.  Patient location: home Provider locations: office  Subjective:    CC: Form completion  HPI: Very pleasant 48 year old female presenting today via MyChart video visit to discuss completion of FMLA forms.  She has been getting FMLA chronically for recurrent kidney stones and is under the care of urologist.  Notes that the FMLA has been sufficient so far however she would like to increase the number of days for each episode because sometimes she is still in quite a bit of pain related to the kidney stones after day 5.  She has been forcing herself to go to work but would like to have a bit of a cushion.  Feels that 1 to 7 days for each episode would be sufficient.  Notes that she is just getting over bronchitis and her ears are very muffled with a lot of pressure.  Feeling better overall however the muffled ears are making it difficult to hear and is quite annoying.  Past medical history, Surgical history, Family history not pertinant except as noted below, Social history, Allergies, and medications have been entered into the medical record, reviewed, and corrections made.   Review of Systems: See HPI for pertinent positives and negatives.   Objective:    General: Speaking clearly in complete sentences without any shortness of breath.  Alert and oriented x3.  Normal judgment. No apparent acute distress.  Impression and Recommendations:    1. Encounter for completion of form with patient (Primary) FMLA forms completed with patient.  Agreeable to extend the episode duration to 1-7 days for each  episode.  Forms faxed to the number provided.  Copies made for our records and to scanned into the chart.  Originals sent to patient via mail per patient request.  2. Dysfunction of both eustachian tubes Discussed the etiology of eustachian tube dysfunction and the associated symptoms of muffled hearing and middle ear effusions.  Recommend getting Flonase to use 2 sprays each nostril twice daily x 3 days then reduce to once daily.  Demonstrated appropriate administration method for the best results.  I discussed the assessment and treatment plan with the patient. The patient was provided an opportunity to ask questions and all were answered. The patient agreed with the plan and demonstrated an understanding of the instructions.   The patient was advised to call back or seek an in-person evaluation if the symptoms worsen or if the condition fails to improve as anticipated.  Return if symptoms worsen or fail to improve.  Thayer Ohm, DNP, APRN, FNP-BC [facility]

## 2023-08-12 ENCOUNTER — Other Ambulatory Visit: Payer: Self-pay | Admitting: Physician Assistant

## 2023-08-12 ENCOUNTER — Other Ambulatory Visit: Payer: Self-pay | Admitting: Medical-Surgical

## 2023-08-12 DIAGNOSIS — E785 Hyperlipidemia, unspecified: Secondary | ICD-10-CM

## 2023-08-12 DIAGNOSIS — B009 Herpesviral infection, unspecified: Secondary | ICD-10-CM

## 2023-08-13 ENCOUNTER — Other Ambulatory Visit (HOSPITAL_COMMUNITY): Payer: Self-pay

## 2023-08-13 ENCOUNTER — Other Ambulatory Visit: Payer: Self-pay

## 2023-08-13 MED ORDER — VALACYCLOVIR HCL 500 MG PO TABS
500.0000 mg | ORAL_TABLET | Freq: Every day | ORAL | 0 refills | Status: DC
Start: 2023-08-13 — End: 2023-08-22
  Filled 2023-08-13: qty 90, 90d supply, fill #0

## 2023-08-13 MED ORDER — ATORVASTATIN CALCIUM 80 MG PO TABS
80.0000 mg | ORAL_TABLET | Freq: Every day | ORAL | 1 refills | Status: DC
Start: 2023-08-13 — End: 2024-02-10
  Filled 2023-08-13: qty 90, 90d supply, fill #0
  Filled 2023-11-13: qty 90, 90d supply, fill #1

## 2023-08-16 ENCOUNTER — Other Ambulatory Visit (HOSPITAL_COMMUNITY): Payer: Self-pay

## 2023-08-16 ENCOUNTER — Other Ambulatory Visit: Payer: Self-pay | Admitting: Family Medicine

## 2023-08-16 DIAGNOSIS — R109 Unspecified abdominal pain: Secondary | ICD-10-CM

## 2023-08-16 MED ORDER — IBUPROFEN 800 MG PO TABS
800.0000 mg | ORAL_TABLET | Freq: Three times a day (TID) | ORAL | 0 refills | Status: AC | PRN
Start: 2023-08-16 — End: ?
  Filled 2023-08-16: qty 30, 10d supply, fill #0

## 2023-08-22 ENCOUNTER — Ambulatory Visit: Payer: Commercial Managed Care - PPO | Admitting: Medical-Surgical

## 2023-08-22 ENCOUNTER — Other Ambulatory Visit (HOSPITAL_COMMUNITY): Payer: Self-pay

## 2023-08-22 ENCOUNTER — Encounter: Payer: Self-pay | Admitting: Medical-Surgical

## 2023-08-22 VITALS — BP 112/68 | HR 97 | Temp 98.8°F | Resp 20 | Ht <= 58 in | Wt 135.0 lb

## 2023-08-22 DIAGNOSIS — E034 Atrophy of thyroid (acquired): Secondary | ICD-10-CM | POA: Diagnosis not present

## 2023-08-22 DIAGNOSIS — R7303 Prediabetes: Secondary | ICD-10-CM | POA: Diagnosis not present

## 2023-08-22 DIAGNOSIS — J101 Influenza due to other identified influenza virus with other respiratory manifestations: Secondary | ICD-10-CM | POA: Diagnosis not present

## 2023-08-22 DIAGNOSIS — K219 Gastro-esophageal reflux disease without esophagitis: Secondary | ICD-10-CM | POA: Diagnosis not present

## 2023-08-22 DIAGNOSIS — R079 Chest pain, unspecified: Secondary | ICD-10-CM | POA: Diagnosis not present

## 2023-08-22 DIAGNOSIS — B009 Herpesviral infection, unspecified: Secondary | ICD-10-CM | POA: Diagnosis not present

## 2023-08-22 DIAGNOSIS — Z3041 Encounter for surveillance of contraceptive pills: Secondary | ICD-10-CM

## 2023-08-22 DIAGNOSIS — I10 Essential (primary) hypertension: Secondary | ICD-10-CM | POA: Diagnosis not present

## 2023-08-22 LAB — POCT INFLUENZA A/B
Influenza A, POC: NEGATIVE
Influenza B, POC: NEGATIVE

## 2023-08-22 LAB — POC COVID19 BINAXNOW: SARS Coronavirus 2 Ag: POSITIVE — AB

## 2023-08-22 MED ORDER — LEVOTHYROXINE SODIUM 50 MCG PO TABS
50.0000 ug | ORAL_TABLET | Freq: Every day | ORAL | 3 refills | Status: AC
  Filled 2023-08-22 – 2023-10-30 (×2): qty 90, 90d supply, fill #0
  Filled 2024-01-29: qty 90, 90d supply, fill #1
  Filled 2024-04-25: qty 90, 90d supply, fill #2
  Filled 2024-07-25: qty 90, 90d supply, fill #3

## 2023-08-22 MED ORDER — VALACYCLOVIR HCL 500 MG PO TABS
500.0000 mg | ORAL_TABLET | Freq: Every day | ORAL | 3 refills | Status: AC
  Filled 2023-08-22 – 2023-11-13 (×2): qty 90, 90d supply, fill #0
  Filled 2024-03-19: qty 90, 90d supply, fill #1
  Filled 2024-06-20: qty 90, 90d supply, fill #2
  Filled 2024-07-28: qty 90, 90d supply, fill #0

## 2023-08-22 MED ORDER — LISINOPRIL-HYDROCHLOROTHIAZIDE 20-25 MG PO TABS
1.0000 | ORAL_TABLET | Freq: Every day | ORAL | 3 refills | Status: AC
  Filled 2023-08-22 – 2023-10-22 (×2): qty 90, 90d supply, fill #0
  Filled 2024-01-23: qty 90, 90d supply, fill #1
  Filled 2024-04-23: qty 90, 90d supply, fill #2
  Filled 2024-07-17: qty 90, 90d supply, fill #3

## 2023-08-22 MED ORDER — NITROGLYCERIN 0.3 MG SL SUBL
0.3000 mg | SUBLINGUAL_TABLET | SUBLINGUAL | 12 refills | Status: AC | PRN
Start: 2023-08-22 — End: ?
  Filled 2023-08-22: qty 100, 10d supply, fill #0

## 2023-08-22 MED ORDER — OSELTAMIVIR PHOSPHATE 75 MG PO CAPS
75.0000 mg | ORAL_CAPSULE | Freq: Two times a day (BID) | ORAL | 0 refills | Status: AC
  Filled 2023-08-22: qty 10, 5d supply, fill #0

## 2023-08-22 MED ORDER — IPRATROPIUM BROMIDE 0.06 % NA SOLN
2.0000 | Freq: Four times a day (QID) | NASAL | 1 refills | Status: AC | PRN
  Filled 2023-08-22: qty 15, 19d supply, fill #0

## 2023-08-22 MED ORDER — LEVONORGESTREL-ETHINYL ESTRAD 0.1-20 MG-MCG PO TABS
1.0000 | ORAL_TABLET | Freq: Every day | ORAL | 3 refills | Status: DC
Start: 2023-08-22 — End: 2024-02-20
  Filled 2023-08-22: qty 84, 84d supply, fill #0
  Filled 2023-11-29: qty 84, 84d supply, fill #1

## 2023-08-22 MED ORDER — PANTOPRAZOLE SODIUM 40 MG PO TBEC
40.0000 mg | DELAYED_RELEASE_TABLET | Freq: Every day | ORAL | 3 refills | Status: DC
  Filled 2023-08-22: qty 90, 90d supply, fill #0
  Filled 2024-01-09: qty 90, 90d supply, fill #1
  Filled 2024-04-15: qty 90, 90d supply, fill #2
  Filled 2024-07-28: qty 90, 90d supply, fill #0

## 2023-08-22 NOTE — Progress Notes (Signed)
Established patient visit  History, exam, impression, and plan:  Coughing, Friday after work started, pain in head when coughing, no CP/SOB, stomach/back sore from coughing. Mon fever 99.4, Tuesday 100.4, Sinus congestion, eye pain, coughing up yellowish clear but was thick yellowish, barking cough, diarrhea once, no N/V. Mucinex, Tylenol, Ibuprofen  Not checking BP, active at work, no s/s, compliant meds, drinks ETOH on weekends, sometimes swells after that  Birth control- ok  1. Encounter for surveillance of contraceptive pills Very pleasant 48 year old female presenting today for surveillance of contraceptive pills.  She has been on COC's long-term and reports that she is doing very well with Vienva 1 tablet daily.  She has a menstrual cycle every month rather than skipping periods and denies any concerns with heavy bleeding, cramping, nausea, vomiting, or mood swings.  Overdue for cervical cancer screening and denies any missed doses.  Continue Vienva 1 tablet daily as prescribed. - levonorgestrel-ethinyl estradiol (VIENVA) 0.1-20 MG-MCG tablet; Take 1 tablet by mouth daily.  Dispense: 84 tablet; Refill: 3  2. Hypothyroidism due to acquired atrophy of thyroid History of hypothyroidism with normal result for TSH about 6 months ago however the 1 prior was abnormal.  Plan to recheck today.  For now continue levothyroxine 50 mcg daily as this is well-tolerated.  Of note, she is on a multivitamin that contains biotin for hair/skin/nail health. - levothyroxine (SYNTHROID) 50 MCG tablet; Take 1 tablet (50 mcg total) by mouth daily before breakfast.  Dispense: 90 tablet; Refill: 3 - TSH  3. Essential hypertension, benign She is currently taking lisinopril-hydrochlorothiazide 20-25 mg daily, tolerating well without side effects.  Not checking blood pressures at home.  Following a low-sodium heart healthy diet.  Has a physically active job but does not do any exercise outside of that.  Had  some swelling after drinking beer over 1 weekend however that quickly resolved and she has not repeated the behavior.  No other concerning symptoms.  Cardiopulmonary exam is normal today.  Continue lisinopril-hydrochlorothiazide as blood pressure is at goal. - lisinopril-hydrochlorothiazide (ZESTORETIC) 20-25 MG tablet; Take 1 tablet by mouth daily.  Dispense: 90 tablet; Refill: 3  4. Chest pain, unspecified type History of chest pain followed by cardiology.  She has nitroglycerin to use as needed for chest pain but has not had to use this in a while.  Refilling today as her previous prescriptions have expired. - nitroGLYCERIN (NITROSTAT) 0.3 MG SL tablet; Place 1 tablet (0.3 mg total) under the tongue every 5 (five) minutes as needed for chest pain.  Dispense: 25 tablet; Refill: 12  5. Gastroesophageal reflux disease, unspecified whether esophagitis present She does have history of GERD that is followed by gastroenterology.  She is taking pantoprazole 40 mg once daily, tolerating well without side effects.  Is unable to discontinue the medication due to resurgence of her symptoms.  Aware of risks and benefits associated with long-term PPI therapy.  Would like to continue pantoprazole so refilling today. - pantoprazole (PROTONIX) 40 MG tablet; Take 1 tablet (40 mg total) by mouth daily.  Dispense: 90 tablet; Refill: 3  6. HSV infection History of HSV infection currently taking prophylactic Valtrex 500 mg once daily.  Has had no outbreaks or concerns.  Tolerates the medication well without side effects.  Plan to continue Valtrex 500 mg daily as prescribed. - valACYclovir (VALTREX) 500 MG tablet; Take 1 tablet (500 mg total) by mouth daily.  Dispense: 90 tablet; Refill: 3  7. Prediabetes (  Primary) History of prediabetes that has toe with the line of transition to type 2 diabetes.  Last check was 6 months ago at 6.3%.  Rechecking today. - Hemoglobin A1c  8. Influenza A Has been exposed to her son  who had flu over the last week and weekend.  On Friday, after work, she started coughing and had a slight sore throat.  Over the weekend her cough progressively got worse until she was having severe headaches with her coughing spells.  She has had no chest pain or shortness of breath.  She notes that her stomach and abdomen are sore from coughing.  On Monday she had a low-grade temperature of 99.4 but Tuesday her temperature went up to 100.4.  Today she is experiencing sinus congestion, eye pain, cough productive of yellow sputum, barking in nature to her cough, and 1 episode of diarrhea.  She has tried Mucinex, Tylenol, and ibuprofen without benefit.  POCT COVID-negative.  POCT influenza A positive.  Treating with Tamiflu twice daily x 5 days.  Discussed over-the-counter and home remedies to help with symptom management.  Offered cough syrup but she would like to hold off on this and did not feel it was necessary.  Refilling her Atrovent nasal spray to help with sinus congestion. - POCT Influenza A/B - POC COVID-19  Procedures performed this visit: None.  Return in about 6 months (around 02/19/2024) for chronic disease follow up.  __________________________________ Thayer Ohm, DNP, APRN, FNP-BC Primary Care and Sports Medicine [facility]

## 2023-08-23 ENCOUNTER — Encounter: Payer: Self-pay | Admitting: Medical-Surgical

## 2023-08-23 ENCOUNTER — Other Ambulatory Visit (HOSPITAL_COMMUNITY): Payer: Self-pay

## 2023-08-24 ENCOUNTER — Ambulatory Visit: Payer: Commercial Managed Care - PPO | Admitting: Medical-Surgical

## 2023-08-25 LAB — TSH: TSH: 6.35 u[IU]/mL — ABNORMAL HIGH (ref 0.450–4.500)

## 2023-08-25 LAB — HEMOGLOBIN A1C
Est. average glucose Bld gHb Est-mCnc: 143 mg/dL
Hgb A1c MFr Bld: 6.6 % — ABNORMAL HIGH (ref 4.8–5.6)

## 2023-09-19 ENCOUNTER — Encounter: Payer: Self-pay | Admitting: Urology

## 2023-09-19 ENCOUNTER — Ambulatory Visit: Payer: Commercial Managed Care - PPO | Admitting: Urology

## 2023-09-19 VITALS — BP 144/76 | HR 67 | Ht <= 58 in | Wt 136.0 lb

## 2023-09-19 DIAGNOSIS — N200 Calculus of kidney: Secondary | ICD-10-CM

## 2023-09-19 DIAGNOSIS — R109 Unspecified abdominal pain: Secondary | ICD-10-CM

## 2023-09-19 LAB — MICROSCOPIC EXAMINATION

## 2023-09-19 LAB — URINALYSIS, ROUTINE W REFLEX MICROSCOPIC
Bilirubin, UA: NEGATIVE
Glucose, UA: NEGATIVE
Ketones, UA: NEGATIVE
Leukocytes,UA: NEGATIVE
Nitrite, UA: NEGATIVE
Protein,UA: NEGATIVE
Specific Gravity, UA: 1.015 (ref 1.005–1.030)
Urobilinogen, Ur: 0.2 mg/dL (ref 0.2–1.0)
pH, UA: 7 (ref 5.0–7.5)

## 2023-09-19 NOTE — Progress Notes (Signed)
 Assessment: 1. Nephrolithiasis   2. Flank pain, left    Plan: I personally reviewed the patient's chart including provider notes, lab and imaging results. I personally reviewed the KUB from 05/30/2023.  There appears to be a very small calcification in the upper left renal shadow and a possible 10 mm calcification overlying the right renal shadow. Given her history and current symptoms, recommend further evaluation with CT renal stone study.  Chief Complaint:  Chief Complaint  Patient presents with   Nephrolithiasis    History of Present Illness:  Jillian Lee is a 48 y.o. female who is seen in consultation from Morey Hummingbird, DO for evaluation of nephrolithiasis. She has a long history of nephrolithiasis beginning in childhood.  She has passed multiple stones and has required surgical management.  Most recently, she was followed at [facility].  She underwent ureteroscopy with laser lithotripsy for a left renal pelvic stone and left lower pole stone in June 2020.  At the time of her last visit in March 2022 she was doing fairly well. Stone analysis showed calcium oxalate. She reports passing small stone fragments with intermittent left-sided flank pain.  No right sided flank pain.  She is not having any symptoms at the present time.  She does have some frequency and nocturia.  No dysuria or gross hematuria. She had a KUB done in November 2024 which showed punctate upper pole calcifications on the left and a questionable 10 mm right renal calculus.  Past Medical History:  Past Medical History:  Diagnosis Date   GERD (gastroesophageal reflux disease)    History of kidney stones    HSV (herpes simplex virus) infection    Hypercholesterolemia    Hypertension    Hypothyroidism    IDA (iron deficiency anemia)    Pre-diabetes    Reactive airway disease that is not asthma    per pt pcp in epic 01-08-2019  per had chest discomfort with breathing,  symbicort inhaler given,  (01-27-2019  per pt stop inhaler on daily basis condition improved    Past Surgical History:  Past Surgical History:  Procedure Laterality Date   CYSTOSCOPY/URETEROSCOPY/HOLMIUM LASER/STENT PLACEMENT Left 12/31/2018   Procedure: CYSTOSCOPY/URETEROSCOPY/HOLMIUM LASER/STENT PLACEMENT;LEFT RETROGRADE PYELOGRAM;STONE BASKET EXTRACTION;  Surgeon: Jerilee Field, MD;  Location: [facility];  Service: Urology;  Laterality: Left;   URETEROSCOPY WITH HOLMIUM LASER LITHOTRIPSY Left 01/28/2019   Procedure: URETEROSCOPY WITH HOLMIUM LASER LITHOTRIPSY/ STENT EXCHANGE;  Surgeon: Jerilee Field, MD;  Location: [facility];  Service: Urology;  Laterality: Left;    Allergies:  No Known Allergies  Family History:  Family History  Problem Relation Age of Onset   Anxiety disorder Mother    Hypertension Mother    Dementia Mother    Alcohol abuse Mother    Depression Mother    Stroke Mother    Cancer Maternal Grandmother    Diabetes Maternal Grandmother    Alcohol abuse Maternal Uncle    Diabetes Maternal Uncle    Hypertension Maternal Uncle     Social History:  Social History   Tobacco Use   Smoking status: Never   Smokeless tobacco: Never  Vaping Use   Vaping status: Never Used  Substance Use Topics   Alcohol use: Yes    Comment: 6-8 ciders every other weekend   Drug use: Never    Review of symptoms:  Constitutional:  Negative for unexplained weight loss, night sweats, fever, chills ENT:  Negative for nose bleeds, sinus pain, painful swallowing CV:  Negative for chest pain, shortness of breath, exercise intolerance, palpitations, loss of consciousness Resp:  Negative for cough, wheezing, shortness of breath GI:  Negative for nausea, vomiting, diarrhea, bloody stools GU:  Positives noted in HPI; otherwise negative for gross hematuria, dysuria, urinary incontinence Neuro:  Negative for seizures, poor balance, limb weakness, slurred speech Psych:  Negative for lack of  energy, depression, anxiety Endocrine:  Negative for polydipsia, polyuria, symptoms of hypoglycemia (dizziness, hunger, sweating) Hematologic:  Negative for anemia, purpura, petechia, prolonged or excessive bleeding, use of anticoagulants  Allergic:  Negative for difficulty breathing or choking as a result of exposure to anything; no shellfish allergy; no allergic response (rash/itch) to materials, foods  Physical exam: BP (!) 144/76   Pulse 67   Ht 4\' 9"  (1.448 m)   Wt 136 lb (61.7 kg)   BMI 29.43 kg/m  GENERAL APPEARANCE:  Well appearing, well developed, well nourished, NAD HEENT: Atraumatic, Normocephalic, oropharynx clear. NECK: Supple without lymphadenopathy or thyromegaly. LUNGS: Clear to auscultation bilaterally. HEART: Regular Rate and Rhythm without murmurs, gallops, or rubs. ABDOMEN: Soft, non-tender, No Masses. EXTREMITIES: Moves all extremities well.  Without clubbing, cyanosis, or edema. NEUROLOGIC:  Alert and oriented x 3, normal gait, CN II-XII grossly intact.  MENTAL STATUS:  Appropriate. BACK:  Non-tender to palpation.  No CVAT SKIN:  Warm, dry and intact.    Results: U/A:  0-5 WBC, 0-2 RBC, few bacteria

## 2023-09-20 ENCOUNTER — Telehealth: Payer: Self-pay | Admitting: Urology

## 2023-09-20 NOTE — Telephone Encounter (Signed)
 LVM for patient to move up follow up appt to between 03/27-04/04.

## 2023-09-20 NOTE — Telephone Encounter (Signed)
 Per Marcelino Duster ok to leave pt on for 04/09. Does not need to be moved.

## 2023-09-22 ENCOUNTER — Ambulatory Visit (HOSPITAL_BASED_OUTPATIENT_CLINIC_OR_DEPARTMENT_OTHER)

## 2023-09-28 ENCOUNTER — Ambulatory Visit (HOSPITAL_BASED_OUTPATIENT_CLINIC_OR_DEPARTMENT_OTHER)
Admission: RE | Admit: 2023-09-28 | Discharge: 2023-09-28 | Disposition: A | Discharge status: Home / Self Care | Source: Ambulatory Visit | Attending: Urology | Admitting: Urology

## 2023-09-28 DIAGNOSIS — R109 Unspecified abdominal pain: Secondary | ICD-10-CM | POA: Diagnosis not present

## 2023-09-28 DIAGNOSIS — N200 Calculus of kidney: Secondary | ICD-10-CM | POA: Insufficient documentation

## 2023-10-03 ENCOUNTER — Ambulatory Visit: Admitting: Medical-Surgical

## 2023-10-09 ENCOUNTER — Encounter: Payer: Self-pay | Admitting: Urology

## 2023-10-10 ENCOUNTER — Ambulatory Visit: Payer: Self-pay

## 2023-10-10 NOTE — Telephone Encounter (Signed)
 Chief Complaint: pain with urination, back pain Symptoms: pain with void, back pain bilaterally in kidney area Frequency: about 4 days Pertinent Negatives: Patient denies fever, blood in urine,  Disposition: [] ED /[] Urgent Care (no appt availability in office) / [x] Appointment(In office/virtual)/ []  [facility]/ [] Home Care/ [] Refused Recommended Disposition /[] [facility]/ []  Follow-up with PCP Additional Notes: Pt states that she has hx of UTIs, this feels similar. Pt states that about 4 days ago she started with back pain and this morning notes that she is having pain with urination. Pt denies fever, denies blood in urine. Pt advised that she should see provider within next 24 hours. Pt wanted to wait 2 days, RN advised pt that if she does have an infection the infection could spread farther into her body and cause damage. Pt understands and will to be scheduled tomorrow. Advised that she could use UC today if that works better for her schedule. Pt refused states that UC makes her wait to long and she does not have time in her schedule today. Pt scheduled tomorrow.  Copied from CRM [id number]. Topic: Clinical - Red Word Triage >> Oct 10, 2023 11:12 AM Tiffany H wrote: [facility] that prompted transfer to Nurse Triage: Patient called to advise that she thinks she has a kidney infection. Patient is tired and has pain when passing urine.   Kidneys and back have been badly aching for the past couple of days. Both sides of her back are hurting, not isolated to one side.   Pain level is at a 5. Reason for Disposition  Side (flank) or lower back pain present  Answer Assessment - Initial Assessment Questions 1. SYMPTOM: "What's the main symptom you're concerned about?" (e.g., frequency, incontinence)     Pain with urination 2. ONSET: "When did the  pain with urination  start?"     4 days 3. PAIN: "Is there any pain?" If Yes, ask: "How bad is it?" (Scale: 1-10; mild, moderate,  severe)     3 4. CAUSE: "What do you think is causing the symptoms?"     UTI 5. OTHER SYMPTOMS: "Do you have any other symptoms?" (e.g., blood in urine, fever, flank pain, pain with urination)     Flank pain, pain with urination,  6. PREGNANCY: "Is there any chance you are pregnant?" "When was your last menstrual period?"     denies  Protocols used: Urinary Symptoms-A-AH

## 2023-10-10 NOTE — Telephone Encounter (Signed)
 Patient scheduled 10/11/22 with Christen Butter, NP

## 2023-10-11 ENCOUNTER — Encounter: Payer: Self-pay | Admitting: Medical-Surgical

## 2023-10-11 ENCOUNTER — Ambulatory Visit: Admitting: Medical-Surgical

## 2023-10-11 ENCOUNTER — Other Ambulatory Visit (HOSPITAL_COMMUNITY): Payer: Self-pay

## 2023-10-11 VITALS — BP 120/74 | HR 72 | Resp 20 | Ht <= 58 in | Wt 134.7 lb

## 2023-10-11 DIAGNOSIS — E119 Type 2 diabetes mellitus without complications: Secondary | ICD-10-CM | POA: Diagnosis not present

## 2023-10-11 DIAGNOSIS — R300 Dysuria: Secondary | ICD-10-CM

## 2023-10-11 LAB — POCT URINALYSIS DIP (CLINITEK)
Bilirubin, UA: NEGATIVE
Blood, UA: NEGATIVE
Glucose, UA: NEGATIVE mg/dL
Ketones, POC UA: NEGATIVE mg/dL
Leukocytes, UA: NEGATIVE
Nitrite, UA: NEGATIVE
POC PROTEIN,UA: NEGATIVE
Spec Grav, UA: <=1.005 — AB (ref 1.010–1.025)
Urobilinogen, UA: 0.2 U/dL
pH, UA: 5.5 (ref 5.0–8.0)

## 2023-10-11 LAB — POCT UA - MICROALBUMIN
Albumin/Creatinine Ratio, Urine, POC: <30
Creatinine, POC: 50 mg/dL
Microalbumin Ur, POC: 10 mg/L

## 2023-10-11 MED ORDER — FREESTYLE LANCETS MISC
1.0000 | Freq: Two times a day (BID) | 11 refills | Status: AC | PRN
  Filled 2023-10-11: qty 100, 50d supply, fill #0

## 2023-10-11 MED ORDER — LANCET DEVICE MISC
1.0000 | Freq: Two times a day (BID) | 0 refills | Status: AC | PRN
  Filled 2023-10-11: qty 1, fill #0

## 2023-10-11 MED ORDER — BLOOD GLUCOSE TEST VI STRP
1.0000 | ORAL_STRIP | Freq: Two times a day (BID) | 11 refills | Status: AC | PRN
  Filled 2023-10-11: qty 100, 50d supply, fill #0

## 2023-10-11 MED ORDER — BLOOD GLUCOSE MONITOR SYSTEM W/DEVICE KIT
1.0000 | PACK | Freq: Two times a day (BID) | 0 refills | Status: AC | PRN
  Filled 2023-10-11: qty 1, 30d supply, fill #0

## 2023-10-11 NOTE — Progress Notes (Signed)
        Established patient visit  History, exam, impression, and plan:  1. Dysuria (Primary) Pleasant 48 year old female presents today with reports of several days of of dysuria including burning, frequency, and urgency.  Also has developed bilateral back pain in the area of the kidneys and is worried about potential pyelonephritis.  She does have a history of kidney stones that is currently being monitored by urology.  Denies fever, chills, hematuria, nausea, and vomiting.  Reports that her symptoms are little better today.  POCT urinalysis looks perfect so no need for culture.  No microscopic hematuria.  After discussion of potential other causes, patient notes that she has been constipated recently however over the last 2 days, her bowels have begun to move and the constipation has resolved.  Suspect this was the cause of her dysuria especially in the setting that it is now getting better on its own.  Reviewed recommendations to avoid constipation and the importance of staying well-hydrated.  Monitor for return of symptoms and if this occurs, we can certainly recheck her urine. - POCT URINALYSIS DIP (CLINITEK)  2. Type 2 diabetes mellitus without complication, without long-term current use of insulin (HCC) On recent labs, her hemoglobin A1c came back at 6.6% indicating she has progressed to type 2 diabetes.  She reports a history of gestational diabetes which required her to check her sugars and monitor appropriately.  Does not have a glucometer at home so sending in a prescription today.  She is already looking to make dietary modifications as she endorses eating "like crap".  Plan to refer to diabetic nutrition to supplement her education and help with changes.  Reviewed the importance of a foot exam today however she adamantly declined and stated that she would let us know should she develop any neuropathy symptoms.  Point-of-care microalbumin completed with normal results.  Discussed the  importance of eye exams once yearly with an emphasis on checking for diabetic retinopathy.  She is already being treated with an ACE inhibitor as well as a high intensity statin so no additional medications at this time.  Her A1c is currently considered diet and lifestyle controlled so we will hold off on adding a medication but would like her to make consistent changes to aid in reducing her hemoglobin A1c back to a normal range.  Patient verbalized understanding is agreeable to the plan.  We will reevaluate her A1c in August as scheduled. - Amb ref to Medical Nutrition Therapy-MNT - POCT UA - Microalbumin   Procedures performed this visit: None.  Return for follow up as scheduled in August.  __________________________________ Thayer Ohm, DNP, APRN, FNP-BC Primary Care and Sports Medicine [facility] [place]

## 2023-10-11 NOTE — Patient Instructions (Addendum)
 Glycemic Index- look it up on Google and go to the images tab. This will tell you the glycemic index number for certain foods. The higher the number, the worse it affects your sugar.

## 2023-10-17 ENCOUNTER — Ambulatory Visit: Admitting: Urology

## 2023-10-22 ENCOUNTER — Other Ambulatory Visit (HOSPITAL_COMMUNITY): Payer: Self-pay

## 2023-10-23 ENCOUNTER — Other Ambulatory Visit (HOSPITAL_COMMUNITY): Payer: Self-pay

## 2023-10-30 ENCOUNTER — Other Ambulatory Visit (HOSPITAL_COMMUNITY): Payer: Self-pay

## 2023-11-13 ENCOUNTER — Other Ambulatory Visit: Payer: Self-pay

## 2023-11-13 ENCOUNTER — Other Ambulatory Visit (HOSPITAL_COMMUNITY): Payer: Self-pay

## 2024-02-10 ENCOUNTER — Other Ambulatory Visit: Payer: Self-pay | Admitting: Physician Assistant

## 2024-02-10 DIAGNOSIS — E785 Hyperlipidemia, unspecified: Secondary | ICD-10-CM

## 2024-02-11 ENCOUNTER — Other Ambulatory Visit (HOSPITAL_COMMUNITY): Payer: Self-pay

## 2024-02-11 MED ORDER — ATORVASTATIN CALCIUM 80 MG PO TABS
80.0000 mg | ORAL_TABLET | Freq: Every day | ORAL | 1 refills | Status: AC
  Filled 2024-02-11: qty 90, 90d supply, fill #0
  Filled 2024-03-29: qty 90, 90d supply, fill #1
  Filled 2024-04-03: qty 30, 30d supply, fill #1
  Filled 2024-07-01: qty 90, 90d supply, fill #1

## 2024-02-20 ENCOUNTER — Encounter: Payer: Self-pay | Admitting: Medical-Surgical

## 2024-02-20 ENCOUNTER — Ambulatory Visit (INDEPENDENT_AMBULATORY_CARE_PROVIDER_SITE_OTHER): Payer: Commercial Managed Care - PPO | Admitting: Medical-Surgical

## 2024-02-20 ENCOUNTER — Other Ambulatory Visit (HOSPITAL_COMMUNITY): Payer: Self-pay

## 2024-02-20 VITALS — BP 124/70 | HR 66 | Resp 20 | Ht <= 58 in | Wt 130.8 lb

## 2024-02-20 DIAGNOSIS — Z1231 Encounter for screening mammogram for malignant neoplasm of breast: Secondary | ICD-10-CM

## 2024-02-20 DIAGNOSIS — B009 Herpesviral infection, unspecified: Secondary | ICD-10-CM

## 2024-02-20 DIAGNOSIS — Z3041 Encounter for surveillance of contraceptive pills: Secondary | ICD-10-CM

## 2024-02-20 DIAGNOSIS — E119 Type 2 diabetes mellitus without complications: Secondary | ICD-10-CM

## 2024-02-20 DIAGNOSIS — I10 Essential (primary) hypertension: Secondary | ICD-10-CM | POA: Diagnosis not present

## 2024-02-20 DIAGNOSIS — E785 Hyperlipidemia, unspecified: Secondary | ICD-10-CM

## 2024-02-20 DIAGNOSIS — K219 Gastro-esophageal reflux disease without esophagitis: Secondary | ICD-10-CM

## 2024-02-20 DIAGNOSIS — E034 Atrophy of thyroid (acquired): Secondary | ICD-10-CM

## 2024-02-20 LAB — POCT GLYCOSYLATED HEMOGLOBIN (HGB A1C): Hemoglobin A1C: 6 % — AB (ref 4.0–5.6)

## 2024-02-20 MED ORDER — LEVONORGESTREL-ETHINYL ESTRAD 0.1-20 MG-MCG PO TABS
1.0000 | ORAL_TABLET | Freq: Every day | ORAL | 3 refills | Status: AC
  Filled 2024-02-20: qty 84, 84d supply, fill #0
  Filled 2024-05-18: qty 84, 84d supply, fill #1
  Filled 2024-07-28 – 2024-08-12 (×2): qty 84, 84d supply, fill #0

## 2024-02-20 NOTE — Progress Notes (Signed)
 Established patient visit   History of Present Illness   Discussed the use of AI scribe software for clinical note transcription with the patient, who gave verbal consent to proceed.  History of Present Illness   Jillian Lee is a 48 year old female with type 2 diabetes who presents for a six-month follow-up.  Her hemoglobin A1c was 6.6% at the last check 6 months ago. She is exercising more and being more mindful of her diet, though she has not been checking her blood glucose levels.  She has been experiencing left shoulder pain for approximately three months. The pain is a deep, dull ache that can become sharp after heavy activity. It is tender in the front of the shoulder and worsens when lying on it at night, disrupting her sleep. She occasionally takes ibuprofen  and Tylenol  sparingly for pain relief.  Her current medications include Lipitor, birth control, levothyroxine , lisinopril  with hydrochlorothiazide , Protonix , and Valtrex  for chronic management.        Physical Exam   Physical Exam Vitals reviewed.  Constitutional:      General: She is not in acute distress.    Appearance: Normal appearance. She is not ill-appearing.  HENT:     Head: Normocephalic and atraumatic.  Cardiovascular:     Rate and Rhythm: Normal rate and regular rhythm.     Pulses: Normal pulses.     Heart sounds: Normal heart sounds. No murmur heard.    No friction rub. No gallop.  Pulmonary:     Effort: Pulmonary effort is normal. No respiratory distress.     Breath sounds: Normal breath sounds. No wheezing.  Skin:    General: Skin is warm and dry.  Neurological:     Mental Status: She is alert and oriented to person, place, and time.  Psychiatric:        Mood and Affect: Mood normal.        Behavior: Behavior normal.        Thought Content: Thought content normal.        Judgment: Judgment normal.     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 Pain exacerbated by overuse and certain movements. Differential includes soft tissue injury rather than bony pathology. - Recommend home exercises for rotator cuff strengthening. - Advise use of topical treatments such as Tiger Balm or lidocaine  patches. - Suggest heat or ice application as needed. - Avoid overhead activities. - Consider x-ray if symptoms persist or worsen.  Type 2 diabetes mellitus Hemoglobin A1c improved from 6.6% to 6.0%, indicating better glycemic control due to increased exercise and dietary mindfulness. - Continue current lifestyle modifications including exercise and dietary awareness. - No need for home glucose monitoring at this time. - Encourage annual foot exams; advised on daily self-exams and proper foot care.  Essential hypertension Blood pressure is well-controlled with current medication regimen. - Continue Lisinopril -hctz. - Checking labs today.  Hypothyroidism Well-managed on current medication regimen. - Checking TSH.  Hyperlipidemia Well-managed on current medication regimen. - Checking lipids.  Gastroesophageal reflux disease (GERD) Controlled with pantoprazole , taken once daily in the evening. - Continue Protonix  40mg  daily.   Genital herpes on suppressive therapy On daily suppressive therapy with valacyclovir , which she tolerates well. - Continue Valtrex  for suppression.  Contraceptive management Doing well on current birth control regimen. - Send refill for birth control  to pharmacy.          Follow up   Return in about 6 months (around 08/22/2024) for chronic disease follow up.  __________________________________ Zada FREDRIK Palin, DNP, APRN, FNP-BC Primary Care and Sports Medicine [facility]

## 2024-02-21 ENCOUNTER — Ambulatory Visit: Payer: Self-pay | Admitting: Medical-Surgical

## 2024-02-21 LAB — LIPID PANEL
Chol/HDL Ratio: 3.7 ratio (ref 0.0–4.4)
Cholesterol, Total: 192 mg/dL (ref 100–199)
HDL: 52 mg/dL (ref >39)
LDL Chol Calc (NIH): 120 mg/dL — ABNORMAL HIGH (ref 0–99)
Triglycerides: 111 mg/dL (ref 0–149)
VLDL Cholesterol Cal: 20 mg/dL (ref 5–40)

## 2024-02-21 LAB — CBC
Hematocrit: 42.6 % (ref 34.0–46.6)
Hemoglobin: 13.7 g/dL (ref 11.1–15.9)
MCH: 31.4 pg (ref 26.6–33.0)
MCHC: 32.2 g/dL (ref 31.5–35.7)
MCV: 98 fL — ABNORMAL HIGH (ref 79–97)
Platelets: 309 x10E3/uL (ref 150–450)
RBC: 4.36 x10E6/uL (ref 3.77–5.28)
RDW: 12.3 % (ref 11.7–15.4)
WBC: 8.7 x10E3/uL (ref 3.4–10.8)

## 2024-02-21 LAB — CMP14+EGFR
ALT: 25 IU/L (ref 0–32)
AST: 23 IU/L (ref 0–40)
Albumin: 4.3 g/dL (ref 3.9–4.9)
Alkaline Phosphatase: 82 IU/L (ref 44–121)
BUN/Creatinine Ratio: 18 (ref 9–23)
BUN: 15 mg/dL (ref 6–24)
Bilirubin Total: 0.6 mg/dL (ref 0.0–1.2)
CO2: 20 mmol/L (ref 20–29)
Calcium: 9.5 mg/dL (ref 8.7–10.2)
Chloride: 96 mmol/L (ref 96–106)
Creatinine, Ser: 0.84 mg/dL (ref 0.57–1.00)
Globulin, Total: 2.4 g/dL (ref 1.5–4.5)
Glucose: 90 mg/dL (ref 70–99)
Potassium: 3.3 mmol/L — ABNORMAL LOW (ref 3.5–5.2)
Sodium: 135 mmol/L (ref 134–144)
Total Protein: 6.7 g/dL (ref 6.0–8.5)
eGFR: 85 mL/min/1.73 (ref >59)

## 2024-02-21 LAB — TSH: TSH: 2.83 u[IU]/mL (ref 0.450–4.500)

## 2024-03-05 ENCOUNTER — Encounter: Payer: Self-pay | Admitting: Medical-Surgical

## 2024-03-05 ENCOUNTER — Ambulatory Visit (INDEPENDENT_AMBULATORY_CARE_PROVIDER_SITE_OTHER): Admitting: Medical-Surgical

## 2024-03-05 ENCOUNTER — Other Ambulatory Visit (HOSPITAL_COMMUNITY): Payer: Self-pay

## 2024-03-05 VITALS — BP 101/59 | HR 69 | Temp 97.9°F | Resp 20 | Ht <= 58 in | Wt 130.0 lb

## 2024-03-05 DIAGNOSIS — J329 Chronic sinusitis, unspecified: Secondary | ICD-10-CM

## 2024-03-05 DIAGNOSIS — J40 Bronchitis, not specified as acute or chronic: Secondary | ICD-10-CM | POA: Diagnosis not present

## 2024-03-05 MED ORDER — BENZONATATE 200 MG PO CAPS
200.0000 mg | ORAL_CAPSULE | Freq: Three times a day (TID) | ORAL | 0 refills | Status: AC | PRN
  Filled 2024-03-05: qty 45, 15d supply, fill #0

## 2024-03-05 MED ORDER — AMOXICILLIN-POT CLAVULANATE 875-125 MG PO TABS
1.0000 | ORAL_TABLET | Freq: Two times a day (BID) | ORAL | 0 refills | Status: AC
  Filled 2024-03-05: qty 14, 7d supply, fill #0

## 2024-03-05 NOTE — Progress Notes (Signed)
        Established patient visit   History of Present Illness   Discussed the use of AI scribe software for clinical note transcription with the patient, who gave verbal consent to proceed.  History of Present Illness   Jillian Lee is a 48 year old female who presents with two weeks of upper respiratory symptoms.  Upper respiratory symptoms - Two weeks of burning sensation in the throat, chest, nose, and eyes - Significant swelling in the posterior sinuses - Cough productive of small amounts of whitish sputum with slight yellow tinge - No fever - Mucinex  provides partial relief by breaking up phlegm  Environmental exposures - Works in an environment with significant dust exposure - Sleeping in the living room due to excessive heat, which she believes contributes to symptoms  Medication intolerances and allergies - Allergic to Biaxin, which causes severe reactions - Cannot tolerate codeine  due to gastrointestinal upset       Physical Exam   Physical Exam Vitals reviewed.  Constitutional:      General: She is not in acute distress.    Appearance: Normal appearance. She is well-developed. She is ill-appearing.  HENT:     Head: Normocephalic and atraumatic.     Right Ear: Tympanic membrane and ear canal normal. No middle ear effusion.     Left Ear: Tympanic membrane and ear canal normal.  No middle ear effusion.     Nose: Congestion present. No rhinorrhea.     Right Sinus: Maxillary sinus tenderness and frontal sinus tenderness present.     Left Sinus: Maxillary sinus tenderness and frontal sinus tenderness present.     Mouth/Throat:     Mouth: Mucous membranes are moist.     Pharynx: Uvula midline. Posterior oropharyngeal erythema present. No oropharyngeal exudate.     Tonsils: No tonsillar exudate or tonsillar abscesses.  Eyes:     Conjunctiva/sclera: Conjunctivae normal.     Pupils: Pupils are equal, round, and reactive to light.  Cardiovascular:     Rate and  Rhythm: Normal rate and regular rhythm.     Pulses: Normal pulses.     Heart sounds: Normal heart sounds. No murmur heard.    No friction rub. No gallop.  Pulmonary:     Effort: Pulmonary effort is normal. No respiratory distress.     Breath sounds: Normal breath sounds. No wheezing.  Musculoskeletal:     Cervical back: Normal range of motion and neck supple.  Lymphadenopathy:     Cervical: Cervical adenopathy present.  Skin:    General: Skin is warm and dry.  Neurological:     Mental Status: She is alert and oriented to person, place, and time.  Psychiatric:        Mood and Affect: Mood normal.        Behavior: Behavior normal.        Thought Content: Thought content normal.        Judgment: Judgment normal.    Assessment & Plan   Assessment and Plan    Sinobronchitis Two-week upper respiratory symptoms with cough and sputum. No fever. Augmentin  chosen due to past efficacy and tolerance. - Prescribed Augmentin  BID x 7 days - Prescribed Tessalon  Perles TID prn cough. - Documented allergy to Biaxin and intolerance to codeine .     Follow up   Return if symptoms worsen or fail to improve. __________________________________ Zada FREDRIK Palin, DNP, APRN, FNP-BC Primary Care and Sports Medicine [facility] [place]

## 2024-03-29 ENCOUNTER — Other Ambulatory Visit (HOSPITAL_COMMUNITY): Payer: Self-pay

## 2024-04-03 ENCOUNTER — Other Ambulatory Visit (HOSPITAL_COMMUNITY): Payer: Self-pay

## 2024-04-04 ENCOUNTER — Other Ambulatory Visit (HOSPITAL_COMMUNITY): Payer: Self-pay

## 2024-04-23 ENCOUNTER — Other Ambulatory Visit (HOSPITAL_COMMUNITY): Payer: Self-pay

## 2024-05-20 ENCOUNTER — Other Ambulatory Visit (HOSPITAL_COMMUNITY): Payer: Self-pay

## 2024-07-25 ENCOUNTER — Encounter: Payer: Self-pay | Admitting: Medical-Surgical

## 2024-07-25 ENCOUNTER — Other Ambulatory Visit: Payer: Self-pay

## 2024-07-25 ENCOUNTER — Other Ambulatory Visit (HOSPITAL_COMMUNITY): Payer: Self-pay

## 2024-07-25 DIAGNOSIS — E034 Atrophy of thyroid (acquired): Secondary | ICD-10-CM

## 2024-07-28 ENCOUNTER — Other Ambulatory Visit (HOSPITAL_COMMUNITY): Payer: Self-pay

## 2024-07-28 ENCOUNTER — Other Ambulatory Visit (HOSPITAL_BASED_OUTPATIENT_CLINIC_OR_DEPARTMENT_OTHER): Payer: Self-pay

## 2024-07-29 ENCOUNTER — Other Ambulatory Visit: Payer: Self-pay

## 2024-07-29 ENCOUNTER — Other Ambulatory Visit (HOSPITAL_COMMUNITY): Payer: Self-pay

## 2024-08-08 ENCOUNTER — Telehealth: Payer: Self-pay

## 2024-08-08 NOTE — Telephone Encounter (Signed)
 Joy sent a MyChart message to patient. She didn't view the message. I called and left a message advising she will need to check TSH in 6-8 weeks after recent fill.     Joy's message -  Hi Anitria, I got notification from the pharmacy that the manufacturer for the levothyroxine  you receive has changed. Once you've been on the new manufacturer for six weeks, you will need your TSH level checked. I have already placed the order so when it's time, just swing by the office or any [facility] location to have the blood sample drawn. Thanks, Zada

## 2024-08-12 ENCOUNTER — Other Ambulatory Visit (HOSPITAL_COMMUNITY): Payer: Self-pay

## 2024-08-12 ENCOUNTER — Other Ambulatory Visit: Payer: Self-pay

## 2024-08-12 ENCOUNTER — Other Ambulatory Visit: Payer: Self-pay | Admitting: Medical-Surgical

## 2024-08-12 DIAGNOSIS — K219 Gastro-esophageal reflux disease without esophagitis: Secondary | ICD-10-CM

## 2024-08-12 MED ORDER — PANTOPRAZOLE SODIUM 40 MG PO TBEC
40.0000 mg | DELAYED_RELEASE_TABLET | Freq: Every day | ORAL | 0 refills | Status: AC
  Filled 2024-08-12: qty 90, 90d supply, fill #0

## 2024-08-20 ENCOUNTER — Ambulatory Visit: Admitting: Medical-Surgical

## 2024-09-17 ENCOUNTER — Other Ambulatory Visit
